# Patient Record
Sex: Female | Born: 1935
Health system: Southern US, Community
[De-identification: ages and names within clinical notes are randomized; demographics above are authoritative.]

## PROBLEM LIST (undated history)

## (undated) DIAGNOSIS — Z9010 Acquired absence of unspecified breast and nipple: Secondary | ICD-10-CM

## (undated) DIAGNOSIS — I10 Essential (primary) hypertension: Secondary | ICD-10-CM

## (undated) DIAGNOSIS — C801 Malignant (primary) neoplasm, unspecified: Secondary | ICD-10-CM

## (undated) DIAGNOSIS — E039 Hypothyroidism, unspecified: Secondary | ICD-10-CM

## (undated) HISTORY — PX: BREAST SURGERY: SHX581

## (undated) HISTORY — PX: LEG AMPUTATION: SHX1105

## (undated) HISTORY — PX: ABDOMINAL HYSTERECTOMY: SHX81

---

## 1998-02-01 ENCOUNTER — Inpatient Hospital Stay (HOSPITAL_COMMUNITY): Admission: RE | Admit: 1998-02-01 | Discharge: 1998-02-03 | Payer: Self-pay | Admitting: *Deleted

## 1998-07-25 ENCOUNTER — Ambulatory Visit (HOSPITAL_COMMUNITY): Admission: RE | Admit: 1998-07-25 | Discharge: 1998-07-25 | Payer: Self-pay | Admitting: Oral & Maxillofacial Surgery

## 1998-07-25 ENCOUNTER — Encounter: Payer: Self-pay | Admitting: Oral & Maxillofacial Surgery

## 1999-08-15 ENCOUNTER — Other Ambulatory Visit: Admission: RE | Admit: 1999-08-15 | Discharge: 1999-08-15 | Payer: Self-pay | Admitting: Internal Medicine

## 2000-01-08 ENCOUNTER — Encounter: Payer: Self-pay | Admitting: Internal Medicine

## 2000-01-08 ENCOUNTER — Encounter: Admission: RE | Admit: 2000-01-08 | Discharge: 2000-01-08 | Payer: Self-pay | Admitting: Internal Medicine

## 2000-08-22 ENCOUNTER — Encounter: Admission: RE | Admit: 2000-08-22 | Discharge: 2000-08-22 | Payer: Self-pay | Admitting: Internal Medicine

## 2000-08-22 ENCOUNTER — Encounter: Payer: Self-pay | Admitting: Internal Medicine

## 2001-03-12 ENCOUNTER — Encounter: Admission: RE | Admit: 2001-03-12 | Discharge: 2001-03-12 | Payer: Self-pay | Admitting: Internal Medicine

## 2001-03-12 ENCOUNTER — Encounter: Payer: Self-pay | Admitting: Internal Medicine

## 2001-03-20 ENCOUNTER — Encounter: Payer: Self-pay | Admitting: Internal Medicine

## 2001-03-20 ENCOUNTER — Encounter: Admission: RE | Admit: 2001-03-20 | Discharge: 2001-03-20 | Payer: Self-pay | Admitting: Internal Medicine

## 2002-07-21 ENCOUNTER — Encounter: Payer: Self-pay | Admitting: Emergency Medicine

## 2002-07-21 ENCOUNTER — Inpatient Hospital Stay (HOSPITAL_COMMUNITY): Admission: EM | Admit: 2002-07-21 | Discharge: 2002-07-24 | Payer: Self-pay | Admitting: Emergency Medicine

## 2002-07-22 ENCOUNTER — Encounter: Payer: Self-pay | Admitting: Specialist

## 2002-07-23 ENCOUNTER — Encounter: Payer: Self-pay | Admitting: Specialist

## 2003-02-08 ENCOUNTER — Encounter: Admission: RE | Admit: 2003-02-08 | Discharge: 2003-02-08 | Payer: Self-pay | Admitting: Internal Medicine

## 2003-02-08 ENCOUNTER — Encounter: Payer: Self-pay | Admitting: Internal Medicine

## 2003-02-08 ENCOUNTER — Encounter (INDEPENDENT_AMBULATORY_CARE_PROVIDER_SITE_OTHER): Payer: Self-pay | Admitting: Diagnostic Radiology

## 2003-02-08 ENCOUNTER — Encounter (INDEPENDENT_AMBULATORY_CARE_PROVIDER_SITE_OTHER): Payer: Self-pay | Admitting: *Deleted

## 2003-02-15 ENCOUNTER — Encounter: Admission: RE | Admit: 2003-02-15 | Discharge: 2003-02-15 | Payer: Self-pay | Admitting: General Surgery

## 2003-02-15 ENCOUNTER — Encounter: Payer: Self-pay | Admitting: General Surgery

## 2003-02-17 ENCOUNTER — Ambulatory Visit (HOSPITAL_BASED_OUTPATIENT_CLINIC_OR_DEPARTMENT_OTHER): Admission: RE | Admit: 2003-02-17 | Discharge: 2003-02-17 | Payer: Self-pay | Admitting: General Surgery

## 2003-02-17 ENCOUNTER — Encounter: Payer: Self-pay | Admitting: General Surgery

## 2003-02-17 ENCOUNTER — Encounter (INDEPENDENT_AMBULATORY_CARE_PROVIDER_SITE_OTHER): Payer: Self-pay | Admitting: *Deleted

## 2003-03-10 ENCOUNTER — Encounter (INDEPENDENT_AMBULATORY_CARE_PROVIDER_SITE_OTHER): Payer: Self-pay | Admitting: *Deleted

## 2003-03-10 ENCOUNTER — Inpatient Hospital Stay (HOSPITAL_COMMUNITY): Admission: RE | Admit: 2003-03-10 | Discharge: 2003-03-12 | Payer: Self-pay | Admitting: General Surgery

## 2003-07-06 ENCOUNTER — Encounter: Admission: RE | Admit: 2003-07-06 | Discharge: 2003-10-04 | Payer: Self-pay | Admitting: Specialist

## 2004-03-13 ENCOUNTER — Encounter: Admission: RE | Admit: 2004-03-13 | Discharge: 2004-03-13 | Payer: Self-pay | Admitting: Oncology

## 2004-07-02 ENCOUNTER — Ambulatory Visit: Payer: Self-pay | Admitting: Oncology

## 2004-07-09 ENCOUNTER — Encounter: Admission: RE | Admit: 2004-07-09 | Discharge: 2004-10-07 | Payer: Self-pay | Admitting: Specialist

## 2004-10-16 ENCOUNTER — Encounter: Admission: RE | Admit: 2004-10-16 | Discharge: 2004-10-16 | Payer: Self-pay | Admitting: General Surgery

## 2004-12-28 ENCOUNTER — Ambulatory Visit: Payer: Self-pay | Admitting: Oncology

## 2005-07-15 ENCOUNTER — Ambulatory Visit: Payer: Self-pay | Admitting: Oncology

## 2005-10-30 ENCOUNTER — Encounter: Admission: RE | Admit: 2005-10-30 | Discharge: 2005-10-30 | Payer: Self-pay | Admitting: Internal Medicine

## 2006-01-10 ENCOUNTER — Ambulatory Visit: Payer: Self-pay | Admitting: Oncology

## 2006-02-21 LAB — CBC WITH DIFFERENTIAL/PLATELET
BASO%: 0.1 % (ref 0.0–2.0)
Basophils Absolute: 0 10*3/uL (ref 0.0–0.1)
EOS%: 2.8 % (ref 0.0–7.0)
Eosinophils Absolute: 0.2 10*3/uL (ref 0.0–0.5)
HCT: 33 % — ABNORMAL LOW (ref 34.8–46.6)
HGB: 11.3 g/dL — ABNORMAL LOW (ref 11.6–15.9)
LYMPH%: 23.1 % (ref 14.0–48.0)
MCH: 31.4 pg (ref 26.0–34.0)
MCHC: 34.1 g/dL (ref 32.0–36.0)
MCV: 92.1 fL (ref 81.0–101.0)
MONO#: 0.5 10*3/uL (ref 0.1–0.9)
MONO%: 6.7 % (ref 0.0–13.0)
NEUT#: 5.1 10*3/uL (ref 1.5–6.5)
NEUT%: 67.3 % (ref 39.6–76.8)
Platelets: 184 10*3/uL (ref 145–400)
RBC: 3.58 10*6/uL — ABNORMAL LOW (ref 3.70–5.32)
RDW: 12.8 % (ref 11.3–14.5)
WBC: 7.6 10*3/uL (ref 3.9–10.0)
lymph#: 1.7 10*3/uL (ref 0.9–3.3)

## 2006-07-11 ENCOUNTER — Ambulatory Visit: Payer: Self-pay | Admitting: Internal Medicine

## 2006-07-31 ENCOUNTER — Encounter (INDEPENDENT_AMBULATORY_CARE_PROVIDER_SITE_OTHER): Payer: Self-pay | Admitting: Specialist

## 2006-07-31 ENCOUNTER — Ambulatory Visit: Payer: Self-pay | Admitting: Internal Medicine

## 2006-08-20 ENCOUNTER — Ambulatory Visit: Payer: Self-pay | Admitting: Oncology

## 2006-08-22 LAB — CBC WITH DIFFERENTIAL/PLATELET
BASO%: 0.2 % (ref 0.0–2.0)
EOS%: 2.4 % (ref 0.0–7.0)
LYMPH%: 19.3 % (ref 14.0–48.0)
MCHC: 34.5 g/dL (ref 32.0–36.0)
MCV: 92.5 fL (ref 81.0–101.0)
MONO#: 0.6 10*3/uL (ref 0.1–0.9)
MONO%: 10.7 % (ref 0.0–13.0)
NEUT%: 67.4 % (ref 39.6–76.8)
RBC: 2.97 10*6/uL — ABNORMAL LOW (ref 3.70–5.32)
RDW: 13.5 % (ref 11.3–14.5)
lymph#: 1 10*3/uL (ref 0.9–3.3)

## 2006-08-22 LAB — IRON AND TIBC: UIBC: 185 ug/dL

## 2006-08-22 LAB — RETICULOCYTES
IRF: 0.18 (ref 0.130–0.330)
Retic %: 0.3 % — ABNORMAL LOW (ref 0.4–2.3)

## 2006-08-22 LAB — VITAMIN B12: Vitamin B-12: 638 pg/mL (ref 211–911)

## 2006-08-29 ENCOUNTER — Emergency Department (HOSPITAL_COMMUNITY): Admission: EM | Admit: 2006-08-29 | Discharge: 2006-08-29 | Payer: Self-pay | Admitting: Family Medicine

## 2006-11-04 ENCOUNTER — Encounter: Admission: RE | Admit: 2006-11-04 | Discharge: 2006-11-04 | Payer: Self-pay | Admitting: Oncology

## 2007-02-17 ENCOUNTER — Ambulatory Visit: Payer: Self-pay | Admitting: Oncology

## 2007-02-19 LAB — COMPREHENSIVE METABOLIC PANEL
ALT: 14 U/L (ref 0–35)
AST: 23 U/L (ref 0–37)
Alkaline Phosphatase: 47 U/L (ref 39–117)
Creatinine, Ser: 1.11 mg/dL (ref 0.40–1.20)
Sodium: 139 mEq/L (ref 135–145)
Total Bilirubin: 0.4 mg/dL (ref 0.3–1.2)

## 2007-02-19 LAB — CBC WITH DIFFERENTIAL/PLATELET
BASO%: 0.1 % (ref 0.0–2.0)
EOS%: 3 % (ref 0.0–7.0)
HCT: 29.4 % — ABNORMAL LOW (ref 34.8–46.6)
LYMPH%: 23.2 % (ref 14.0–48.0)
MCH: 31.8 pg (ref 26.0–34.0)
MCHC: 34.9 g/dL (ref 32.0–36.0)
NEUT%: 67.5 % (ref 39.6–76.8)
Platelets: 133 10*3/uL — ABNORMAL LOW (ref 145–400)
RBC: 3.23 10*6/uL — ABNORMAL LOW (ref 3.70–5.32)

## 2007-08-17 ENCOUNTER — Ambulatory Visit: Payer: Self-pay | Admitting: Oncology

## 2007-08-19 LAB — CBC & DIFF AND RETIC
Eosinophils Absolute: 0.2 10*3/uL (ref 0.0–0.5)
HCT: 31.3 % — ABNORMAL LOW (ref 34.8–46.6)
LYMPH%: 24.7 % (ref 14.0–48.0)
MCH: 31.3 pg (ref 26.0–34.0)
MCHC: 34.5 g/dL (ref 32.0–36.0)
MONO#: 0.4 10*3/uL (ref 0.1–0.9)
MONO%: 5.8 % (ref 0.0–13.0)
NEUT#: 4.1 10*3/uL (ref 1.5–6.5)
RBC: 3.45 10*6/uL — ABNORMAL LOW (ref 3.70–5.32)
RDW: 12.9 % (ref 11.3–14.5)
RETIC #: 22.4 10*3/uL (ref 19.7–115.1)
Retic %: 0.7 % (ref 0.4–2.3)
WBC: 6.2 10*3/uL (ref 3.9–10.0)
lymph#: 1.5 10*3/uL (ref 0.9–3.3)

## 2007-08-19 LAB — MORPHOLOGY: PLT EST: NORMAL

## 2007-08-20 LAB — COMPREHENSIVE METABOLIC PANEL
ALT: 14 U/L (ref 0–35)
BUN: 25 mg/dL — ABNORMAL HIGH (ref 6–23)
CO2: 27 mEq/L (ref 19–32)
Chloride: 105 mEq/L (ref 96–112)
Creatinine, Ser: 1.08 mg/dL (ref 0.40–1.20)
Potassium: 4.2 mEq/L (ref 3.5–5.3)
Total Bilirubin: 0.4 mg/dL (ref 0.3–1.2)

## 2007-11-05 ENCOUNTER — Encounter: Admission: RE | Admit: 2007-11-05 | Discharge: 2007-11-05 | Payer: Self-pay | Admitting: Oncology

## 2008-04-12 ENCOUNTER — Ambulatory Visit: Payer: Self-pay | Admitting: Oncology

## 2008-04-14 LAB — CBC WITH DIFFERENTIAL/PLATELET
HGB: 10.2 g/dL — ABNORMAL LOW (ref 11.6–15.9)
LYMPH%: 31.5 % (ref 14.0–48.0)
MONO#: 0.4 10*3/uL (ref 0.1–0.9)
NEUT#: 3 10*3/uL (ref 1.5–6.5)
RBC: 3.23 10*6/uL — ABNORMAL LOW (ref 3.70–5.32)
WBC: 5.4 10*3/uL (ref 3.9–10.0)

## 2008-04-15 LAB — COMPREHENSIVE METABOLIC PANEL
ALT: 14 U/L (ref 0–35)
BUN: 27 mg/dL — ABNORMAL HIGH (ref 6–23)
Chloride: 103 mEq/L (ref 96–112)
Glucose, Bld: 121 mg/dL — ABNORMAL HIGH (ref 70–99)
Total Bilirubin: 0.3 mg/dL (ref 0.3–1.2)

## 2008-04-21 ENCOUNTER — Encounter: Payer: Self-pay | Admitting: Internal Medicine

## 2008-06-19 ENCOUNTER — Emergency Department (HOSPITAL_COMMUNITY): Admission: EM | Admit: 2008-06-19 | Discharge: 2008-06-19 | Payer: Self-pay | Admitting: Emergency Medicine

## 2008-11-30 ENCOUNTER — Encounter: Admission: RE | Admit: 2008-11-30 | Discharge: 2008-11-30 | Payer: Self-pay | Admitting: Internal Medicine

## 2009-12-01 ENCOUNTER — Encounter: Admission: RE | Admit: 2009-12-01 | Discharge: 2009-12-01 | Payer: Self-pay | Admitting: Internal Medicine

## 2010-10-26 NOTE — Op Note (Signed)
   NAME:  Alexandra Alexander, Alexandra Alexander                        ACCOUNT NO.:  [id number]   MEDICAL RECORD NO.:  [id number]                   PATIENT TYPE:  INP   LOCATION:  NA                                   FACILITY:  [facility]   PHYSICIAN:  Rose Phi. Maple Hudson, M.D.                DATE OF BIRTH:  Jul 13, 1921   DATE OF PROCEDURE:  03/10/2003  DATE OF DISCHARGE:                                 OPERATIVE REPORT   PREOPERATIVE DIAGNOSIS:  Lobular carcinoma of the right breast.   POSTOPERATIVE DIAGNOSIS:  Lobular carcinoma of the right breast.   OPERATION:  Right total mastectomy.   SURGEON:  Rose Phi. Maple Hudson, M.D.   ANESTHESIA:  General.   OPERATIVE PROCEDURE:  This 77 year old white female had presented with a  palpable mass in her right breast.  We had done a partial mastectomy and a  sentinel node biopsy for lobular cancer.  Her sentinel nodes were negative,  but she had more diffuse lobular disease than had been anticipated and we  had margins that were involved throughout and for that reason, felt a  completion mastectomy was the most appropriate for her.   After suitable general anesthesia was induced, the patient was placed in a  supine position with arms extended on the arm board and the right breast and  axilla were prepped and draped in usual fashion.  Transverse elliptical  incisions were then outlined, incorporating the nipple-areolar complex as  well as the previous partial mastectomy site.  The incisions were made and  the flaps dissected superiorly to the clavicle and medially to the sternum  and inferiorly to the rectus fascia and laterally to the latissimus dorsi  muscle.  Following the completion of the flap dissection, we then removed  the breast by dissecting from medial to lateral, incorporating the  pectoralis fascia.  At the lateral margins of the pectoralis major muscle,  we entered the low axilla and removed the lateral part of the breast tissue  and then the complete breast.   Hemostasis was obtained with the cautery.  We  thoroughly irrigated the field with saline.  An 18-French Blake drain was  inserted and brought out through a separate stab wound.  Skin was then  stapled.  Dressing applied.  Patient transferred to the recovery room in  satisfactory condition, having tolerated the procedure well.                                               Rose Phi. Maple Hudson, M.D.    PRY/MEDQ  D:  03/10/2003  T:  03/10/2003  Job:  [id number]

## 2010-10-26 NOTE — Discharge Summary (Signed)
   NAME:  Alexandra Alexander, Alexandra Alexander                        ACCOUNT NO.:  [id number]   MEDICAL RECORD NO.:  [id number]                   PATIENT TYPE:  INP   LOCATION:  5707                                 FACILITY:  [facility]   PHYSICIAN:  Rose Phi. Maple Hudson, M.D.                DATE OF BIRTH:  1921-11-24   DATE OF ADMISSION:  03/10/2003  DATE OF DISCHARGE:  03/12/2003                                 DISCHARGE SUMMARY   HISTORY OF PRESENT ILLNESS:  This 77 year old white married female had had a  large lobular carcinoma of the right breast treated with partial mastectomy  and sentinel node biopsies.  On permanent section, she had extensive  positive margins which were not identified at the time of lumpectomy.  For  that reason, she was scheduled for completion mastectomy.  The rest of the  history is not remarkable.  The only significant physical findings were  confined to the breast, where she had evidence of the previous surgery with  lumpectomy and sentinel node on the right side.  The left breast was normal.  She did have an amputation of her left leg, which had been previously noted.   LABORATORY DATA:  Admission laboratory data were all within normal limits.   COURSE IN THE HOSPITAL:  Shortly after admission, she was taken to the  operating room where a right total mastectomy was carried out.  Everything  went well and postoperatively, she did well, although she had some nausea,  but ambulating was a problem, with her amputation.  She was then discharged  on the second postoperative day to be followed as an outpatient.   FINAL DIAGNOSES:  T1, N0 lobular cancer of the right breast.   OPERATION:  Right total mastectomy.   COMPLICATIONS:  None.   CONDITION ON DISCHARGE:  Improved.   DISCHARGE MEDICATIONS:  Vicodin for pain.   DIET:  Regular.   ACTIVITY:  Limited.   FOLLOWUP:  To be seen in five days in the office.                                                Rose Phi. Maple Hudson,  M.D.    PRY/MEDQ  D:  03/28/2003  T:  03/28/2003  Job:  [id number]

## 2010-10-26 NOTE — Discharge Summary (Signed)
NAME:  Alexandra Alexander, Alexandra Alexander                        ACCOUNT NO.:  [id number]   MEDICAL RECORD NO.:  [id number]                   PATIENT TYPE:  INP   LOCATION:  5002                                 FACILITY:  [facility]   PHYSICIAN:  Kerrin Champagne, M.D.                DATE OF BIRTH:  02/09/1922   DATE OF ADMISSION:  07/21/2002  DATE OF DISCHARGE:  07/24/2002                                 DISCHARGE SUMMARY   DISCHARGE DIAGNOSES:  1. Left comminuted supracondylar femur fracture.  2. Left below-knee amputation for previous history of osteomyelitis,     chronic.  Old diagnosis.  3. Posttraumatic hemorrhage secondary to left femur fracture.   CHIEF COMPLAINT:  Left distal thigh pain.   HISTORY OF PRESENT ILLNESS:  This patient is a 77 year old female with a  history of previous left below-knee amputation 25 years ago for  osteomyelitis following an ORIF of a left tibial fracture by Paul Dykes. [name],  M.D.  She has done relatively well without significant medical problems,  using left below-knee amputation prosthesis for ambulation purposes  independently.  On the evening of July 21, 2002, while ambulating at  home, she treated her left leg BK prosthesis.  Apparently the prosthesis  came loose and she fell to the floor, sustaining a left femur fracture.   ALLERGIES:  CODEINE.   MEDICATIONS:  None.   PAST SURGICAL HISTORY:  Significant for:  1. Left below-knee amputation some 25 years ago.  2. Previous left ORIF of tibial fracture with placement of screw some 25     years ago.  3. Bilateral cataract procedures almost two months ago.   LABORATORY DATA:  Admission laboratory data, including EKG, chest x-ray,  BMET, CBC, differential, and chest x-ray all without acute abnormalities.  The patient's hemoglobin on admission was 11.3.  Plain radiographs of the  left leg demonstrating a comminuted left distal femoral supracondylar  fracture with some comminution.  There is a long extension.   There are  fragments extending proximally over the anterior aspect of the femur.  There  is flexion deformity of about 40 degrees and some medial displacement of the  distal fragment and varus deformity.   PHYSICAL EXAMINATION:  GENERAL APPEARANCE:  A 77 year old female on the  examination table in the emergency room.  She is moderately obese.  She is  alert and oriented x 4.  Left below-knee amputation stump and left femur  without obvious wound.  She has swelling about the left distal femur in the  distal one-third above the knee joint.  The patient's BK stump appears to be  in good condition.  HEENT:  Pupils are equal, round, and reactive to light and accomodation.  NECK:  Supple with full range of motion.  No thyromegaly noted.  No carotid  bruits noted.  CHEST:  Clear to auscultation.  HEART:  Regular rate and rhythm without murmur, rub, or  gallop.  ABDOMEN:  Soft and nontender.  EXTREMITIES:  Hips without discomfort.  Right lower extremity without  abnormality.  Pulses complete in the right lower extremity.  The left leg  has a BK stump.  The stump itself is well closed.  No abnormalities of skin  noted.  Warm, sensate leg with normal motor.  PELVIS:  Without discomfort.   HOSPITAL COURSE:  In the emergency room, treatment was undertaken with  placement into a knee immobilizer following Ace wrap and plain radiographs  demonstrated good position and alignment of the left distal femur fracture  in the knee immobilizer.   The patient was seen in the emergency room at [facility] and underwent initial evaluation.  She was placed in a knee  immobilizer with Ace wrap applied.  With the knee immobilizer, the fracture  reduced quite nicely in both AP and lateral planes.  Follow-up radiographs  demonstrated this.  The patient was admitted and placed on PCA with morphine  and oral medications.  A nursing home placement was undertaken.  The patient  was placed on  Lovenox for DVT prophylaxis and PAS hose.  After a period of  two days, placement was able to be performed.  The patient was placed in the  [facility].  The patient's hemoglobin  throughout the hospitalization did show some decrease from an initial  admission of 11 down to 9.8 on July 23, 2002, prior to her discharge.  Further hemoglobin and hematocrit were ordered for the a.m. of July 24, 2002.  The patient will be followed closely at the nursing home for this and  has been placed on Trinsicon one tablet p.o. b.i.d. and Colace.  On day #2  of this patient's admission, July 23, 2002, the patient's lower  extremity exam on the left side demonstrate normal sensate BK stump with a  normal peripheral circulation pattern.  The leg with Ace wrap knee  immobilizer demonstrated no significant deformity.  The patient was  comfortable with oral medications and muscle relaxants.  IV was  discontinued.  The patient then had a Foley in place.  She was discharged to  the [facility] on July 24, 2002, with  stable pain pattern and in stable condition regarding the left lower  extremity.   DISPOSITION:  The patient was discharged to the [facility] where she will require assistance and transfers from bed to chair and  bed to wheelchair, nonweightbearing of the left lower extremity.  She will  maintain bed rest for most of this time and there will be no weightbearing  on the left lower extremity for at least six to eight weeks postoperatively.  She will be maintained with narcotic medicines by mouth, an anticoagulation  program for anti-DVT prophylaxis, Lovenox 30 mg daily.  She requires  additional checks of hemoglobin and hematocrit to ascertain normalization  and stabilization of this during her stay in the nursing center.  No  incisions are present requiring examination.  CONDITION ON DISCHARGE:   Stable and improved.   DISCHARGE MEDICATIONS:  1. Percocet 5/325 mg one to two q.4-6h. p.r.n. pain.  2. Lovenox 30 mg subcu daily.  3. Trinsicon one tablet p.o. b.i.d.  4. Colace 100 mg p.o. b.i.d.  Kerrin Champagne, M.D.    JEN/MEDQ  D:  07/23/2002  T:  07/23/2002  Job:  [id number]

## 2010-10-26 NOTE — Op Note (Signed)
   NAME:  Alexandra Alexander, Alexandra Alexander                        ACCOUNT NO.:  [id number]   MEDICAL RECORD NO.:  [id number]                   PATIENT TYPE:  AMB   LOCATION:  DSC                                  FACILITY:  [facility]   PHYSICIAN:  Rose Phi. Maple Hudson, M.D.                DATE OF BIRTH:  [birth year]   DATE OF PROCEDURE:  02/17/2003  DATE OF DISCHARGE:                                 OPERATIVE REPORT   PREOPERATIVE DIAGNOSIS:  Carcinoma of the right breast.   POSTOPERATIVE DIAGNOSIS:  Carcinoma of the right breast.   OPERATION:  1. Blue dye injection.  2. Right sentinel lymph node biopsy.  3. Right partial mastectomy.   SURGEON:  Rose Phi. Maple Hudson, M.D.   ANESTHESIA:  General.   DESCRIPTION OF PROCEDURE:  Prior to coming to the operating room, 1 mCi of  technetium sulfur colloid was injected intradermally.   After suitable general anesthesia was induced, the patient was placed in the  supine position and with arms extended.  A mixture of 2 mL of methylene blue  and 3 mL of saline were then injected in the subareolar tissue and the  breast gently massaged.   We then prepped and draped the breast in a standard fashion.  A right  axillary incision was then made with dissection through the subcutaneous  tissue to the clavipectoral fascia.  Just beneath the clavipectoral fascia  were two enlarged lymph nodes, which we removed.  They were hot on the scan  but not much blue dye.  These were submitted as sentinel nodes.  There were  no other palpable, blue, or hot nodes.   While that was being evaluated by the pathologist, a curved incision  overlying the palpable mass at the 6:30 position of the right breast was  then made and a wide excision was carried out and the specimen oriented for  the pathologist.   Touch prep on the nodes and touch prep on the margins were clean.   Incisions were closed with 3-0 Vicryl and subcuticular 4-0 Monocryl with  Steri-Strips.  Dressings applied.  Patient  transferred to the recovery room  in satisfactory condition, having tolerated the procedure well.                                               Rose Phi. Maple Hudson, M.D.    PRY/MEDQ  D:  02/17/2003  T:  02/18/2003  Job:  [id number]   cc:   Geoffry Paradise, M.D.  [address]  [place]  [place] [address]  Fax: [phone]

## 2010-10-26 NOTE — Assessment & Plan Note (Signed)
[facility]                         GASTROENTEROLOGY OFFICE NOTE   Alexandra Alexander, Alexandra Alexander                     MRN:          [id number]  DATE:07/11/2006                            DOB:          [birth year]    REFERRING PHYSICIAN:  Geoffry Paradise, M.D.   OFFICE CONSULTATION NOTE   HISTORY:  This is a pleasant 77 year old white female with a history of  hypertension, hypothyroidism, debilitating arthritis, breast cancer, and  depression, who is referred through the courtesy of Dr. Jacky Kindle  regarding abdominal complaints, weight loss, and anemia.  The patient  underwent amputation of the lower portion of her left leg may years ago  after, poor healing secondary to trauma.  Recently, she has had pain or  arthritic change in that stump.  For this, she has been on a number of  different medications, including Vicodin and Duragesic patch.  This has  resulted in problems with constipation and uncomfortable abdomen.  She  has also had decrease in appetite and a 13 pound weight loss over the  past 2 months.  The patient's laboratories revealed a normocytic anemia,  which is not new for the patient.  Submitted Hemoccult cards were  negative for occult blood.  Has been on MiraLax for her bowels.  This  has helped.  She denies heartburn, dysphagia, nausea or vomiting,  melena, or hematochezia.  She has not had a prior GI evaluation for any  reason.  No prior screening colonoscopy.   PAST MEDICAL HISTORY:  As above.   PAST SURGICAL HISTORY:  1. Hysterectomy.  2. Bladder tac.  3. Right breast surgery.  4. Appendectomy.  5. Left leg amputation.   ALLERGIES/DRUG INTOLERANCES:  DARVOCET.   CURRENT MEDICATIONS:  1. Synthroid 50 mcg daily.  2. Vicodin 5/500 four daily.  3. Ocuvite eye drops.  4. Tamoxifen 20 mg daily.  5. Metoprolol XL 25 mg daily.  6. Aspirin 81 mg daily.  7. Multivitamin.  8. MiraLax 17 g alternating with 8.5 g daily.  9. Lexapro 10 mg  daily.  10.Zolpidem 5 mg at night time.  11.Tums.  12.Duragesic patch 25 mcg q.3 days.   FAMILY HISTORY:  No family history of colon cancer.  Father with a  history of diabetes.  Mother with a history of breast cancer.   SOCIAL HISTORY:  The patient is widowed and lives alone.  She has 3  children.  She is accompanied today by her daughter-in-law.  She has a  12th grade education and worked her life as a housewife.  She does not  smoke or use alcohol.   REVIEW OF SYSTEMS:  Per diagnostic evaluation form.   PHYSICAL EXAM:  Alert, bright, and pleasant elderly female in no acute  distress.  She has a walker.  Blood pressure 130/72, heart rate 68, weight 120 pounds.  She is 5 feet  1 inch in height.  HEENT:  Sclerae anicteric, conjunctivae slightly pale.  Oral mucosa  intact.  There is no adenopathy.  LUNGS:  Clear.  HEART:  Regular.  ABDOMEN:  Soft without tenderness, mass, or hernia.  LOWER EXTREMITIES:  Prosthetic leg attached.  The right lower extremity  does not have edema.   IMPRESSION:  1. Chronic normocytic anemia with heme-negative stool.  2. Weight loss with associated decreased appetite, possible medication      reaction.  Cannot exclude occult gastrointestinal process such as      neoplasm or ulcer.  3. Constipation, likely medication induced.  4. Multiple medical problems as outlined above.   RECOMMENDATIONS:  Colonoscopy with polypectomy if necessary and upper  endoscopy if colonoscopy unrevealing.  The nature of both procedures as  well as the risks, benefits, and alternatives are reviewed.  She  understood and agreed to proceed.     Wilhemina Bonito. Marina Goodell, MD  Electronically Signed    JNP/MedQ  DD: 07/13/2006  DT: 07/13/2006  Job #: [id number]   cc:   Geoffry Paradise, M.D.  Valentino Hue. Magrinat, M.D.

## 2010-11-13 ENCOUNTER — Other Ambulatory Visit: Payer: Self-pay | Admitting: Internal Medicine

## 2010-11-13 DIAGNOSIS — Z853 Personal history of malignant neoplasm of breast: Secondary | ICD-10-CM

## 2010-12-10 ENCOUNTER — Ambulatory Visit
Admission: RE | Admit: 2010-12-10 | Discharge: 2010-12-10 | Disposition: A | Discharge status: Home / Self Care | Payer: Medicare Other | Source: Ambulatory Visit | Attending: Internal Medicine | Admitting: Internal Medicine

## 2010-12-10 DIAGNOSIS — Z853 Personal history of malignant neoplasm of breast: Secondary | ICD-10-CM

## 2011-01-01 ENCOUNTER — Ambulatory Visit (HOSPITAL_COMMUNITY)
Admission: RE | Admit: 2011-01-01 | Discharge: 2011-01-01 | Disposition: A | Discharge status: Home / Self Care | Payer: Medicare Other | Source: Ambulatory Visit | Attending: Internal Medicine | Admitting: Internal Medicine

## 2011-01-01 ENCOUNTER — Other Ambulatory Visit (HOSPITAL_COMMUNITY): Payer: Self-pay | Admitting: Internal Medicine

## 2011-01-01 DIAGNOSIS — R51 Headache: Secondary | ICD-10-CM | POA: Insufficient documentation

## 2011-01-01 DIAGNOSIS — W19XXXA Unspecified fall, initial encounter: Secondary | ICD-10-CM | POA: Insufficient documentation

## 2011-01-01 DIAGNOSIS — I619 Nontraumatic intracerebral hemorrhage, unspecified: Secondary | ICD-10-CM | POA: Insufficient documentation

## 2011-01-01 DIAGNOSIS — I10 Essential (primary) hypertension: Secondary | ICD-10-CM | POA: Insufficient documentation

## 2011-01-01 DIAGNOSIS — R42 Dizziness and giddiness: Secondary | ICD-10-CM | POA: Insufficient documentation

## 2011-08-08 DIAGNOSIS — E039 Hypothyroidism, unspecified: Secondary | ICD-10-CM | POA: Diagnosis not present

## 2011-08-08 DIAGNOSIS — E785 Hyperlipidemia, unspecified: Secondary | ICD-10-CM | POA: Diagnosis not present

## 2011-08-08 DIAGNOSIS — R82998 Other abnormal findings in urine: Secondary | ICD-10-CM | POA: Diagnosis not present

## 2011-08-08 DIAGNOSIS — I10 Essential (primary) hypertension: Secondary | ICD-10-CM | POA: Diagnosis not present

## 2011-08-14 DIAGNOSIS — I10 Essential (primary) hypertension: Secondary | ICD-10-CM | POA: Diagnosis not present

## 2011-08-14 DIAGNOSIS — C50919 Malignant neoplasm of unspecified site of unspecified female breast: Secondary | ICD-10-CM | POA: Diagnosis not present

## 2011-08-14 DIAGNOSIS — Z0000 Encounter for general adult medical examination without abnormal findings: Secondary | ICD-10-CM | POA: Diagnosis not present

## 2011-08-14 DIAGNOSIS — D638 Anemia in other chronic diseases classified elsewhere: Secondary | ICD-10-CM | POA: Diagnosis not present

## 2011-09-03 DIAGNOSIS — Z1212 Encounter for screening for malignant neoplasm of rectum: Secondary | ICD-10-CM | POA: Diagnosis not present

## 2011-12-05 ENCOUNTER — Other Ambulatory Visit: Payer: Self-pay | Admitting: Internal Medicine

## 2011-12-05 DIAGNOSIS — Z1231 Encounter for screening mammogram for malignant neoplasm of breast: Secondary | ICD-10-CM

## 2011-12-05 DIAGNOSIS — Z9011 Acquired absence of right breast and nipple: Secondary | ICD-10-CM

## 2011-12-30 ENCOUNTER — Ambulatory Visit
Admission: RE | Admit: 2011-12-30 | Discharge: 2011-12-30 | Disposition: A | Discharge status: Home / Self Care | Payer: Medicare Other | Source: Ambulatory Visit | Attending: Internal Medicine | Admitting: Internal Medicine

## 2011-12-30 DIAGNOSIS — Z9011 Acquired absence of right breast and nipple: Secondary | ICD-10-CM

## 2011-12-30 DIAGNOSIS — Z1231 Encounter for screening mammogram for malignant neoplasm of breast: Secondary | ICD-10-CM | POA: Diagnosis not present

## 2012-01-09 ENCOUNTER — Emergency Department (INDEPENDENT_AMBULATORY_CARE_PROVIDER_SITE_OTHER): Payer: Medicare Other

## 2012-01-09 ENCOUNTER — Emergency Department (INDEPENDENT_AMBULATORY_CARE_PROVIDER_SITE_OTHER)
Admission: EM | Admit: 2012-01-09 | Discharge: 2012-01-09 | Disposition: A | Discharge status: Home / Self Care | Payer: Medicare Other | Source: Home / Self Care | Attending: Emergency Medicine | Admitting: Emergency Medicine

## 2012-01-09 ENCOUNTER — Encounter (HOSPITAL_COMMUNITY): Payer: Self-pay | Admitting: *Deleted

## 2012-01-09 DIAGNOSIS — S60219A Contusion of unspecified wrist, initial encounter: Secondary | ICD-10-CM

## 2012-01-09 DIAGNOSIS — M25539 Pain in unspecified wrist: Secondary | ICD-10-CM | POA: Diagnosis not present

## 2012-01-09 DIAGNOSIS — M7989 Other specified soft tissue disorders: Secondary | ICD-10-CM | POA: Diagnosis not present

## 2012-01-09 DIAGNOSIS — S60211A Contusion of right wrist, initial encounter: Secondary | ICD-10-CM

## 2012-01-09 HISTORY — DX: Essential (primary) hypertension: I10

## 2012-01-09 HISTORY — DX: Malignant (primary) neoplasm, unspecified: C80.1

## 2012-01-09 NOTE — ED Notes (Signed)
R  Wrist    Splint  Applied        [employer]

## 2012-01-09 NOTE — ED Provider Notes (Signed)
Chief Complaint  Patient presents with  . Fall    History of Present Illness:   Alexandra Alexander is an 78 year old female who tripped over a crack in the cement and fell in her carport yesterday. She did not hit her head and there was no loss of consciousness. She fell on her outstretched right hand and now has some bruising, swelling, and pain over the wrist. She is able to move her wrist and fingers well and denies any numbness or tingling.  Review of Systems:  Other than noted above, the patient denies any of the following symptoms: Systemic:  No fevers, chills, sweats, or aches.  No fatigue or tiredness. Musculoskeletal:  No joint pain, arthritis, bursitis, swelling, back pain, or neck pain. Neurological:  No muscular weakness, paresthesias, headache, or trouble with speech or coordination.  No dizziness.   PMFSH:  Past medical history, family history, social history, meds, and allergies were reviewed.  Physical Exam:   Vital signs:  BP 192/70  Pulse 55  Temp 97.8 F (36.6 C) (Oral)  Resp 20  SpO2 96% Gen:  Alert and oriented times 3.  In no distress. Musculoskeletal: There is some bruising and swelling over the dorsum and the volar aspect of the wrist but the wrist has a full range of motion with only minimal pain. She also has some bruising over the thenar eminence of the hand. All of her fingers have a full range of motion. Otherwise, all joints had a full a ROM with no swelling, bruising or deformity.  No edema, pulses full. Extremities were warm and pink.  Capillary refill was brisk.  Skin:  Clear, warm and dry.  No rash. Neuro:  Alert and oriented times 3.  Muscle strength was normal.  Sensation was intact to light touch.   Radiology:  Dg Wrist Complete Right  01/09/2012  *RADIOLOGY REPORT*  Clinical Data: Fall yesterday.  Pain swelling and bruising of the wrist  RIGHT WRIST - COMPLETE 3+ VIEW  Comparison: None.  Findings: The bones are osteopenic.  Soft tissue swelling is seen  adjacent to the ulna styloid, and dorsally at the level of the carpals, best seen on the lateral view.  The carpals are located.  No acute or healing fracture is identified.  There is slight cortical thickening along the radial side of the distal radius.  IMPRESSION: 1. Soft tissue swelling dorsal to the carpals and adjacent to the ulna styloid. 2.  No acute fracture is identified.  The bones are osteopenic.  If there is continued concern for acute fracture, further evaluation with MRI or CT could be considered.  Original Report Authenticated By: Britta Mccreedy, M.D.   Course in Urgent Care Center:   She was put in a wrist splint.  Assessment:  The encounter diagnosis was Contusion of right wrist.  Plan:   1.  The following meds were prescribed:   New Prescriptions   No medications on file   2.  The patient was instructed in symptomatic care, including rest and activity, elevation, application of ice and compression.  Appropriate handouts were given. 3.  The patient was told to return if becoming worse in any way, if no better in 3 or 4 days, and given some red flag symptoms that would indicate earlier return.   4.  The patient was told to follow up here if no improvement in 2 weeks.   Reuben Likes, MD 01/09/12 [phone]

## 2012-01-09 NOTE — ED Notes (Signed)
Yesterday  Pt  Lost  Her  Balance  And  Larey Seat  She  inj her  r  Hand  And  Wrist    Bruising  Noted  No  Obvious  Deformity  Did  Not  Black out

## 2012-02-14 DIAGNOSIS — E785 Hyperlipidemia, unspecified: Secondary | ICD-10-CM | POA: Diagnosis not present

## 2012-02-14 DIAGNOSIS — I10 Essential (primary) hypertension: Secondary | ICD-10-CM | POA: Diagnosis not present

## 2012-02-14 DIAGNOSIS — D638 Anemia in other chronic diseases classified elsewhere: Secondary | ICD-10-CM | POA: Diagnosis not present

## 2012-02-14 DIAGNOSIS — Z23 Encounter for immunization: Secondary | ICD-10-CM | POA: Diagnosis not present

## 2012-02-14 DIAGNOSIS — M5106 Intervertebral disc disorders with myelopathy, lumbar region: Secondary | ICD-10-CM | POA: Diagnosis not present

## 2012-08-10 DIAGNOSIS — E039 Hypothyroidism, unspecified: Secondary | ICD-10-CM | POA: Diagnosis not present

## 2012-08-10 DIAGNOSIS — I10 Essential (primary) hypertension: Secondary | ICD-10-CM | POA: Diagnosis not present

## 2012-08-10 DIAGNOSIS — E785 Hyperlipidemia, unspecified: Secondary | ICD-10-CM | POA: Diagnosis not present

## 2012-08-17 DIAGNOSIS — E785 Hyperlipidemia, unspecified: Secondary | ICD-10-CM | POA: Diagnosis not present

## 2012-08-17 DIAGNOSIS — Z0000 Encounter for general adult medical examination without abnormal findings: Secondary | ICD-10-CM | POA: Diagnosis not present

## 2012-08-17 DIAGNOSIS — D638 Anemia in other chronic diseases classified elsewhere: Secondary | ICD-10-CM | POA: Diagnosis not present

## 2012-08-17 DIAGNOSIS — R82998 Other abnormal findings in urine: Secondary | ICD-10-CM | POA: Diagnosis not present

## 2012-08-17 DIAGNOSIS — E039 Hypothyroidism, unspecified: Secondary | ICD-10-CM | POA: Diagnosis not present

## 2012-08-17 DIAGNOSIS — I10 Essential (primary) hypertension: Secondary | ICD-10-CM | POA: Diagnosis not present

## 2012-08-17 DIAGNOSIS — Z23 Encounter for immunization: Secondary | ICD-10-CM | POA: Diagnosis not present

## 2012-08-31 DIAGNOSIS — Z1212 Encounter for screening for malignant neoplasm of rectum: Secondary | ICD-10-CM | POA: Diagnosis not present

## 2013-03-03 DIAGNOSIS — E785 Hyperlipidemia, unspecified: Secondary | ICD-10-CM | POA: Diagnosis not present

## 2013-03-03 DIAGNOSIS — Z23 Encounter for immunization: Secondary | ICD-10-CM | POA: Diagnosis not present

## 2013-03-03 DIAGNOSIS — M5106 Intervertebral disc disorders with myelopathy, lumbar region: Secondary | ICD-10-CM | POA: Diagnosis not present

## 2013-03-03 DIAGNOSIS — Z6825 Body mass index (BMI) 25.0-25.9, adult: Secondary | ICD-10-CM | POA: Diagnosis not present

## 2013-03-03 DIAGNOSIS — M1990 Unspecified osteoarthritis, unspecified site: Secondary | ICD-10-CM | POA: Diagnosis not present

## 2013-03-03 DIAGNOSIS — I10 Essential (primary) hypertension: Secondary | ICD-10-CM | POA: Diagnosis not present

## 2013-03-03 DIAGNOSIS — C50919 Malignant neoplasm of unspecified site of unspecified female breast: Secondary | ICD-10-CM | POA: Diagnosis not present

## 2013-03-03 DIAGNOSIS — Z1331 Encounter for screening for depression: Secondary | ICD-10-CM | POA: Diagnosis not present

## 2013-03-03 DIAGNOSIS — S88119A Complete traumatic amputation at level between knee and ankle, unspecified lower leg, initial encounter: Secondary | ICD-10-CM | POA: Diagnosis not present

## 2013-03-03 DIAGNOSIS — E039 Hypothyroidism, unspecified: Secondary | ICD-10-CM | POA: Diagnosis not present

## 2013-03-05 ENCOUNTER — Other Ambulatory Visit: Payer: Self-pay

## 2013-03-05 DIAGNOSIS — Z9011 Acquired absence of right breast and nipple: Secondary | ICD-10-CM

## 2013-03-05 DIAGNOSIS — Z1231 Encounter for screening mammogram for malignant neoplasm of breast: Secondary | ICD-10-CM

## 2013-03-25 ENCOUNTER — Ambulatory Visit: Payer: Medicare Other

## 2013-03-29 ENCOUNTER — Ambulatory Visit
Admission: RE | Admit: 2013-03-29 | Discharge: 2013-03-29 | Disposition: A | Discharge status: Home / Self Care | Payer: Medicare Other | Source: Ambulatory Visit

## 2013-03-29 DIAGNOSIS — Z1231 Encounter for screening mammogram for malignant neoplasm of breast: Secondary | ICD-10-CM

## 2013-03-29 DIAGNOSIS — Z9011 Acquired absence of right breast and nipple: Secondary | ICD-10-CM

## 2013-08-25 DIAGNOSIS — E039 Hypothyroidism, unspecified: Secondary | ICD-10-CM | POA: Diagnosis not present

## 2013-08-25 DIAGNOSIS — I10 Essential (primary) hypertension: Secondary | ICD-10-CM | POA: Diagnosis not present

## 2013-08-25 DIAGNOSIS — R809 Proteinuria, unspecified: Secondary | ICD-10-CM | POA: Diagnosis not present

## 2013-08-25 DIAGNOSIS — E785 Hyperlipidemia, unspecified: Secondary | ICD-10-CM | POA: Diagnosis not present

## 2013-09-01 DIAGNOSIS — I10 Essential (primary) hypertension: Secondary | ICD-10-CM | POA: Diagnosis not present

## 2013-09-01 DIAGNOSIS — D638 Anemia in other chronic diseases classified elsewhere: Secondary | ICD-10-CM | POA: Diagnosis not present

## 2013-09-01 DIAGNOSIS — Z0000 Encounter for general adult medical examination without abnormal findings: Secondary | ICD-10-CM | POA: Diagnosis not present

## 2013-09-01 DIAGNOSIS — C50919 Malignant neoplasm of unspecified site of unspecified female breast: Secondary | ICD-10-CM | POA: Diagnosis not present

## 2013-09-01 DIAGNOSIS — M1990 Unspecified osteoarthritis, unspecified site: Secondary | ICD-10-CM | POA: Diagnosis not present

## 2013-09-01 DIAGNOSIS — E039 Hypothyroidism, unspecified: Secondary | ICD-10-CM | POA: Diagnosis not present

## 2013-09-01 DIAGNOSIS — E785 Hyperlipidemia, unspecified: Secondary | ICD-10-CM | POA: Diagnosis not present

## 2013-09-01 DIAGNOSIS — S88119A Complete traumatic amputation at level between knee and ankle, unspecified lower leg, initial encounter: Secondary | ICD-10-CM | POA: Diagnosis not present

## 2013-09-21 DIAGNOSIS — S88119A Complete traumatic amputation at level between knee and ankle, unspecified lower leg, initial encounter: Secondary | ICD-10-CM | POA: Diagnosis not present

## 2013-09-21 DIAGNOSIS — R269 Unspecified abnormalities of gait and mobility: Secondary | ICD-10-CM | POA: Diagnosis not present

## 2013-09-21 DIAGNOSIS — G547 Phantom limb syndrome without pain: Secondary | ICD-10-CM | POA: Diagnosis not present

## 2013-09-21 DIAGNOSIS — Z6827 Body mass index (BMI) 27.0-27.9, adult: Secondary | ICD-10-CM | POA: Diagnosis not present

## 2013-09-21 DIAGNOSIS — M5106 Intervertebral disc disorders with myelopathy, lumbar region: Secondary | ICD-10-CM | POA: Diagnosis not present

## 2014-01-24 DIAGNOSIS — N390 Urinary tract infection, site not specified: Secondary | ICD-10-CM | POA: Diagnosis not present

## 2014-01-24 DIAGNOSIS — R82998 Other abnormal findings in urine: Secondary | ICD-10-CM | POA: Diagnosis not present

## 2014-01-24 DIAGNOSIS — F29 Unspecified psychosis not due to a substance or known physiological condition: Secondary | ICD-10-CM | POA: Diagnosis not present

## 2014-01-24 DIAGNOSIS — I10 Essential (primary) hypertension: Secondary | ICD-10-CM | POA: Diagnosis not present

## 2014-01-24 DIAGNOSIS — Z6826 Body mass index (BMI) 26.0-26.9, adult: Secondary | ICD-10-CM | POA: Diagnosis not present

## 2014-01-24 DIAGNOSIS — E039 Hypothyroidism, unspecified: Secondary | ICD-10-CM | POA: Diagnosis not present

## 2014-02-10 DIAGNOSIS — N390 Urinary tract infection, site not specified: Secondary | ICD-10-CM | POA: Diagnosis not present

## 2014-03-04 DIAGNOSIS — I10 Essential (primary) hypertension: Secondary | ICD-10-CM | POA: Diagnosis not present

## 2014-03-04 DIAGNOSIS — Z23 Encounter for immunization: Secondary | ICD-10-CM | POA: Diagnosis not present

## 2014-03-04 DIAGNOSIS — M5106 Intervertebral disc disorders with myelopathy, lumbar region: Secondary | ICD-10-CM | POA: Diagnosis not present

## 2014-03-04 DIAGNOSIS — C50919 Malignant neoplasm of unspecified site of unspecified female breast: Secondary | ICD-10-CM | POA: Diagnosis not present

## 2014-03-04 DIAGNOSIS — E785 Hyperlipidemia, unspecified: Secondary | ICD-10-CM | POA: Diagnosis not present

## 2014-03-04 DIAGNOSIS — E039 Hypothyroidism, unspecified: Secondary | ICD-10-CM | POA: Diagnosis not present

## 2014-03-04 DIAGNOSIS — S88119A Complete traumatic amputation at level between knee and ankle, unspecified lower leg, initial encounter: Secondary | ICD-10-CM | POA: Diagnosis not present

## 2014-03-04 DIAGNOSIS — D638 Anemia in other chronic diseases classified elsewhere: Secondary | ICD-10-CM | POA: Diagnosis not present

## 2014-06-24 ENCOUNTER — Emergency Department (HOSPITAL_COMMUNITY): Payer: Medicare Other

## 2014-06-24 ENCOUNTER — Emergency Department (HOSPITAL_COMMUNITY)
Admission: EM | Admit: 2014-06-24 | Discharge: 2014-06-24 | Disposition: A | Discharge status: Home / Self Care | Payer: Medicare Other | Attending: Emergency Medicine | Admitting: Emergency Medicine

## 2014-06-24 ENCOUNTER — Encounter (HOSPITAL_COMMUNITY): Payer: Self-pay | Admitting: Emergency Medicine

## 2014-06-24 DIAGNOSIS — W01198A Fall on same level from slipping, tripping and stumbling with subsequent striking against other object, initial encounter: Secondary | ICD-10-CM | POA: Diagnosis not present

## 2014-06-24 DIAGNOSIS — S199XXA Unspecified injury of neck, initial encounter: Secondary | ICD-10-CM | POA: Diagnosis not present

## 2014-06-24 DIAGNOSIS — S42002A Fracture of unspecified part of left clavicle, initial encounter for closed fracture: Secondary | ICD-10-CM | POA: Diagnosis not present

## 2014-06-24 DIAGNOSIS — Y9389 Activity, other specified: Secondary | ICD-10-CM | POA: Insufficient documentation

## 2014-06-24 DIAGNOSIS — Z85828 Personal history of other malignant neoplasm of skin: Secondary | ICD-10-CM | POA: Insufficient documentation

## 2014-06-24 DIAGNOSIS — Y9289 Other specified places as the place of occurrence of the external cause: Secondary | ICD-10-CM | POA: Diagnosis not present

## 2014-06-24 DIAGNOSIS — Y998 Other external cause status: Secondary | ICD-10-CM | POA: Diagnosis not present

## 2014-06-24 DIAGNOSIS — S42018A Nondisplaced fracture of sternal end of left clavicle, initial encounter for closed fracture: Secondary | ICD-10-CM | POA: Insufficient documentation

## 2014-06-24 DIAGNOSIS — I771 Stricture of artery: Secondary | ICD-10-CM | POA: Diagnosis not present

## 2014-06-24 DIAGNOSIS — S4992XA Unspecified injury of left shoulder and upper arm, initial encounter: Secondary | ICD-10-CM | POA: Diagnosis present

## 2014-06-24 DIAGNOSIS — S0990XA Unspecified injury of head, initial encounter: Secondary | ICD-10-CM | POA: Diagnosis not present

## 2014-06-24 DIAGNOSIS — R030 Elevated blood-pressure reading, without diagnosis of hypertension: Secondary | ICD-10-CM | POA: Diagnosis not present

## 2014-06-24 DIAGNOSIS — I10 Essential (primary) hypertension: Secondary | ICD-10-CM | POA: Insufficient documentation

## 2014-06-24 DIAGNOSIS — M542 Cervicalgia: Secondary | ICD-10-CM | POA: Diagnosis not present

## 2014-06-24 DIAGNOSIS — W19XXXA Unspecified fall, initial encounter: Secondary | ICD-10-CM

## 2014-06-24 HISTORY — DX: Acquired absence of unspecified breast and nipple: Z90.10

## 2014-06-24 LAB — BASIC METABOLIC PANEL
Anion gap: 11 (ref 5–15)
BUN: 26 mg/dL — ABNORMAL HIGH (ref 6–23)
CHLORIDE: 104 meq/L (ref 96–112)
CO2: 27 mmol/L (ref 19–32)
CREATININE: 1.15 mg/dL — AB (ref 0.50–1.10)
Calcium: 9.3 mg/dL (ref 8.4–10.5)
GFR calc Af Amer: 47 mL/min — ABNORMAL LOW (ref >90)
GFR, EST NON AFRICAN AMERICAN: 41 mL/min — AB (ref >90)
Glucose, Bld: 103 mg/dL — ABNORMAL HIGH (ref 70–99)
Potassium: 4.4 mmol/L (ref 3.5–5.1)
Sodium: 142 mmol/L (ref 135–145)

## 2014-06-24 LAB — CBC
HCT: 35.3 % — ABNORMAL LOW (ref 36.0–46.0)
HEMOGLOBIN: 11.6 g/dL — AB (ref 12.0–15.0)
MCH: 29.9 pg (ref 26.0–34.0)
MCHC: 32.9 g/dL (ref 30.0–36.0)
MCV: 91 fL (ref 78.0–100.0)
PLATELETS: 138 10*3/uL — AB (ref 150–400)
RBC: 3.88 MIL/uL (ref 3.87–5.11)
RDW: 12.8 % (ref 11.5–15.5)
WBC: 6.6 10*3/uL (ref 4.0–10.5)

## 2014-06-24 MED ORDER — TRAMADOL HCL 50 MG PO TABS
50.0000 mg | ORAL_TABLET | Freq: Once | ORAL | Status: AC
Start: 2014-06-24 — End: 2014-06-24
  Administered 2014-06-24: 50 mg via ORAL
  Filled 2014-06-24: qty 1

## 2014-06-24 MED ORDER — FENTANYL CITRATE 0.05 MG/ML IJ SOLN
50.0000 ug | Freq: Once | INTRAMUSCULAR | Status: AC
  Administered 2014-06-24: 50 ug via INTRAVENOUS
  Filled 2014-06-24: qty 2

## 2014-06-24 MED ORDER — TRAMADOL HCL 50 MG PO TABS: 50.0000 mg | ORAL_TABLET | Freq: Four times a day (QID) | ORAL | Status: DC | PRN

## 2014-06-24 NOTE — ED Provider Notes (Signed)
CSN: [id number]     Arrival date & time 06/24/14  1321 History   First MD Initiated Contact with Patient 06/24/14 1345     Chief Complaint  Patient presents with  . Fall     (Consider location/radiation/quality/duration/timing/severity/associated sxs/prior Treatment) Patient is a 81 y.o. female presenting with fall. The history is provided by the patient. No language interpreter was used.  Fall This is a new problem. The current episode started today. The problem occurs constantly. The problem has been gradually worsening. Associated symptoms include myalgias. Pertinent negatives include no neck pain. Nothing aggravates the symptoms. She has tried nothing for the symptoms. The treatment provided moderate relief.  pt fell while talking on phone and hit chest and clavicle on a chair.  Pt denies any chest pain. Pt denies hitting her head. No loss of consciousness.    Past Medical History  Diagnosis Date  . Hypertension   . Cancer    Past Surgical History  Procedure Laterality Date  . Leg amputation    . Breast surgery    . Abdominal hysterectomy     History reviewed. No pertinent family history. History  Substance Use Topics  . Smoking status: Never Smoker   . Smokeless tobacco: Not on file  . Alcohol Use: No   OB History    No data available     Review of Systems  Musculoskeletal: Positive for myalgias. Negative for neck pain.  All other systems reviewed and are negative.     Allergies  Codeine  Home Medications   Prior to Admission medications   Medication Sig Start Date End Date Taking? Authorizing Provider  Hydrocodone-Acetaminophen (VICODIN PO) Take by mouth.    Historical Provider, MD   BP 208/68 mmHg  Pulse 66  Temp(Src) 98.9 F (37.2 C) (Oral)  Ht 5\' 4"  (1.626 m)  Wt 130 lb (58.968 kg)  BMI 22.30 kg/m2 Physical Exam  Constitutional: She is oriented to person, place, and time. She appears well-developed and well-nourished.  HENT:  Head: Normocephalic  and atraumatic.  Eyes: Conjunctivae and EOM are normal. Pupils are equal, round, and reactive to light.  Neck: Normal range of motion.  Cardiovascular: Normal rate.   Pulmonary/Chest: Effort normal.  Abdominal: She exhibits no distension.  Musculoskeletal: She exhibits tenderness.  Tender clavicle at sternoclavicular joint  Large bruise  Neurological: She is alert and oriented to person, place, and time.  Skin: Skin is warm.  Psychiatric: She has a normal mood and affect.  Nursing note and vitals reviewed.   ED Course  Procedures (including critical care time) Labs Review Labs Reviewed - No data to display  Imaging Review No results found.   EKG Interpretation None      MDM   Final diagnoses:  [place], PA-C 06/24/14 [place] Alvino [place], MD 06/24/14 [phone]

## 2014-06-24 NOTE — ED Notes (Signed)
Patient transported to CT 

## 2014-06-24 NOTE — ED Notes (Signed)
Dr.Walden at the bedside.  

## 2014-06-24 NOTE — ED Notes (Signed)
Dr. Mingo Amber aware of BP, treat pain and continue to monitor.

## 2014-06-24 NOTE — Discharge Instructions (Signed)
Clavicle Fracture °The clavicle, also called the collarbone, is the long bone that connects your shoulder to your rib cage. You can feel your collarbone at the top of your shoulders and rib cage. A clavicle fracture is a broken clavicle. It is a common injury that can happen at any age.  °CAUSES °Common causes of a clavicle fracture include: °· A direct blow to your shoulder. °· A car accident. °· A fall, especially if you try to break your fall with an outstretched arm. °RISK FACTORS °You may be at increased risk if: °· You are younger than 25 years or older than 75 years. Most clavicle fractures happen to people who are younger than 25 years. °· You are a female. °· You play contact sports. °SIGNS AND SYMPTOMS °A fractured clavicle is painful. It also makes it hard to move your arm. Other signs and symptoms may include: °· A shoulder that drops downward and forward. °· Pain when trying to lift your shoulder. °· Bruising, swelling, and tenderness over your clavicle. °· A grinding noise when you try to move your shoulder. °· A bump over your clavicle. °DIAGNOSIS °Your health care provider can usually diagnose a clavicle fracture by asking about your injury and examining your shoulder and clavicle. He or she may take an X-ray to determine the position of your clavicle. °TREATMENT °Treatment depends on the position of your clavicle after the fracture: °· If the broken ends of the bone are not out of place, your health care provider may put your arm in a sling or wrap a support bandage around your chest (figure-of-eight wrap). °· If the broken ends of the bone are out of place, you may need surgery. Surgery may involve placing screws, pins, or plates to keep your clavicle stable while it heals. Healing may take about 3 months. °When your health care provider thinks your fracture has healed enough, you may have to do physical therapy to regain normal movement and build up your arm strength. °HOME CARE INSTRUCTIONS   °· Apply ice to the injured area: °¨ Put ice in a plastic bag. °¨ Place a towel between your skin and the bag. °¨ Leave the ice on for 20 minutes, 2-3 times a day. °· If you have a wrap or splint: °¨ Wear it all the time, and remove it only to take a bath or shower. °¨ When you bathe or shower, keep your shoulder in the same position as when the sling or wrap is on. °¨ Do not lift your arm. °· If you have a figure-of-eight wrap: °¨ Another person must tighten it every day. °¨ It should be tight enough to hold your shoulders back. °¨ Allow enough room to place your index finger between your body and the strap. °¨ Loosen the wrap immediately if you feel numbness or tingling in your hands. °· Only take medicines as directed by your health care provider. °· Avoid activities that make the injury or pain worse for 4-6 weeks after surgery. °· Keep all follow-up appointments. °SEEK MEDICAL CARE IF:  °Your medicine is not helping to relieve pain and swelling. °SEEK IMMEDIATE MEDICAL CARE IF:  °Your arm is numb, cold, or pale, even when the splint is loose. °MAKE SURE YOU:  °· Understand these instructions. °· Will watch your condition. °· Will get help right away if you are not doing well or get worse. °Document Released: 03/06/2005 Document Revised: 06/01/2013 Document Reviewed: 04/19/2013 °[employer]® Patient Information ©2015 [employer]. This information is   not intended to replace advice given to you by your health care provider. Make sure you discuss any questions you have with your health care provider.

## 2014-06-24 NOTE — ED Notes (Signed)
Discussed plan of care with family and Dr. Mingo Amber at the bedside. Patient was able to transfer to restroom with assistance with no complaints. Preparing for discharge and patient is to take home medications for BP.

## 2014-06-24 NOTE — ED Notes (Signed)
Per ems, pt w/mechanical fall to left side striking table.  Pt arrives awake, alert, oriented, c/o left shoulder/clavicle pain.  Deformity noted, +cms distal to injury

## 2014-06-24 NOTE — ED Notes (Signed)
MD at bedside. 

## 2014-06-30 DIAGNOSIS — N390 Urinary tract infection, site not specified: Secondary | ICD-10-CM | POA: Diagnosis not present

## 2014-07-06 DIAGNOSIS — S42012A Anterior displaced fracture of sternal end of left clavicle, initial encounter for closed fracture: Secondary | ICD-10-CM | POA: Diagnosis not present

## 2014-07-28 DIAGNOSIS — S42012D Anterior displaced fracture of sternal end of left clavicle, subsequent encounter for fracture with routine healing: Secondary | ICD-10-CM | POA: Diagnosis not present

## 2014-09-12 DIAGNOSIS — I10 Essential (primary) hypertension: Secondary | ICD-10-CM | POA: Diagnosis not present

## 2014-09-12 DIAGNOSIS — E039 Hypothyroidism, unspecified: Secondary | ICD-10-CM | POA: Diagnosis not present

## 2014-09-12 DIAGNOSIS — E785 Hyperlipidemia, unspecified: Secondary | ICD-10-CM | POA: Diagnosis not present

## 2014-09-19 DIAGNOSIS — Z6826 Body mass index (BMI) 26.0-26.9, adult: Secondary | ICD-10-CM | POA: Diagnosis not present

## 2014-09-19 DIAGNOSIS — Z89512 Acquired absence of left leg below knee: Secondary | ICD-10-CM | POA: Diagnosis not present

## 2014-09-19 DIAGNOSIS — N184 Chronic kidney disease, stage 4 (severe): Secondary | ICD-10-CM | POA: Diagnosis not present

## 2014-09-19 DIAGNOSIS — I129 Hypertensive chronic kidney disease with stage 1 through stage 4 chronic kidney disease, or unspecified chronic kidney disease: Secondary | ICD-10-CM | POA: Diagnosis not present

## 2014-09-19 DIAGNOSIS — M5106 Intervertebral disc disorders with myelopathy, lumbar region: Secondary | ICD-10-CM | POA: Diagnosis not present

## 2014-09-19 DIAGNOSIS — I10 Essential (primary) hypertension: Secondary | ICD-10-CM | POA: Diagnosis not present

## 2014-09-19 DIAGNOSIS — Z23 Encounter for immunization: Secondary | ICD-10-CM | POA: Diagnosis not present

## 2014-09-19 DIAGNOSIS — Z0000 Encounter for general adult medical examination without abnormal findings: Secondary | ICD-10-CM | POA: Diagnosis not present

## 2014-09-19 DIAGNOSIS — E785 Hyperlipidemia, unspecified: Secondary | ICD-10-CM | POA: Diagnosis not present

## 2014-09-19 DIAGNOSIS — E039 Hypothyroidism, unspecified: Secondary | ICD-10-CM | POA: Diagnosis not present

## 2014-09-19 DIAGNOSIS — M1990 Unspecified osteoarthritis, unspecified site: Secondary | ICD-10-CM | POA: Diagnosis not present

## 2015-03-22 DIAGNOSIS — M1990 Unspecified osteoarthritis, unspecified site: Secondary | ICD-10-CM | POA: Diagnosis not present

## 2015-03-22 DIAGNOSIS — I10 Essential (primary) hypertension: Secondary | ICD-10-CM | POA: Diagnosis not present

## 2015-03-22 DIAGNOSIS — Z6825 Body mass index (BMI) 25.0-25.9, adult: Secondary | ICD-10-CM | POA: Diagnosis not present

## 2015-03-22 DIAGNOSIS — Z23 Encounter for immunization: Secondary | ICD-10-CM | POA: Diagnosis not present

## 2015-03-22 DIAGNOSIS — I129 Hypertensive chronic kidney disease with stage 1 through stage 4 chronic kidney disease, or unspecified chronic kidney disease: Secondary | ICD-10-CM | POA: Diagnosis not present

## 2015-03-22 DIAGNOSIS — N184 Chronic kidney disease, stage 4 (severe): Secondary | ICD-10-CM | POA: Diagnosis not present

## 2015-03-22 DIAGNOSIS — D638 Anemia in other chronic diseases classified elsewhere: Secondary | ICD-10-CM | POA: Diagnosis not present

## 2015-03-22 DIAGNOSIS — K5909 Other constipation: Secondary | ICD-10-CM | POA: Diagnosis not present

## 2015-03-22 DIAGNOSIS — C50919 Malignant neoplasm of unspecified site of unspecified female breast: Secondary | ICD-10-CM | POA: Diagnosis not present

## 2015-03-22 DIAGNOSIS — E785 Hyperlipidemia, unspecified: Secondary | ICD-10-CM | POA: Diagnosis not present

## 2015-03-22 DIAGNOSIS — E039 Hypothyroidism, unspecified: Secondary | ICD-10-CM | POA: Diagnosis not present

## 2015-09-11 DIAGNOSIS — R8290 Unspecified abnormal findings in urine: Secondary | ICD-10-CM | POA: Diagnosis not present

## 2015-09-11 DIAGNOSIS — I10 Essential (primary) hypertension: Secondary | ICD-10-CM | POA: Diagnosis not present

## 2015-09-11 DIAGNOSIS — E039 Hypothyroidism, unspecified: Secondary | ICD-10-CM | POA: Diagnosis not present

## 2015-09-11 DIAGNOSIS — E784 Other hyperlipidemia: Secondary | ICD-10-CM | POA: Diagnosis not present

## 2015-09-18 DIAGNOSIS — I129 Hypertensive chronic kidney disease with stage 1 through stage 4 chronic kidney disease, or unspecified chronic kidney disease: Secondary | ICD-10-CM | POA: Diagnosis not present

## 2015-09-18 DIAGNOSIS — I629 Nontraumatic intracranial hemorrhage, unspecified: Secondary | ICD-10-CM | POA: Diagnosis not present

## 2015-09-18 DIAGNOSIS — Z1389 Encounter for screening for other disorder: Secondary | ICD-10-CM | POA: Diagnosis not present

## 2015-09-18 DIAGNOSIS — G547 Phantom limb syndrome without pain: Secondary | ICD-10-CM | POA: Diagnosis not present

## 2015-09-18 DIAGNOSIS — D638 Anemia in other chronic diseases classified elsewhere: Secondary | ICD-10-CM | POA: Diagnosis not present

## 2015-09-18 DIAGNOSIS — C50919 Malignant neoplasm of unspecified site of unspecified female breast: Secondary | ICD-10-CM | POA: Diagnosis not present

## 2015-09-18 DIAGNOSIS — N184 Chronic kidney disease, stage 4 (severe): Secondary | ICD-10-CM | POA: Diagnosis not present

## 2015-09-18 DIAGNOSIS — Z6826 Body mass index (BMI) 26.0-26.9, adult: Secondary | ICD-10-CM | POA: Diagnosis not present

## 2015-09-18 DIAGNOSIS — K5909 Other constipation: Secondary | ICD-10-CM | POA: Diagnosis not present

## 2015-09-18 DIAGNOSIS — Z0000 Encounter for general adult medical examination without abnormal findings: Secondary | ICD-10-CM | POA: Diagnosis not present

## 2015-09-18 DIAGNOSIS — M5106 Intervertebral disc disorders with myelopathy, lumbar region: Secondary | ICD-10-CM | POA: Diagnosis not present

## 2015-09-18 DIAGNOSIS — Z89512 Acquired absence of left leg below knee: Secondary | ICD-10-CM | POA: Diagnosis not present

## 2016-04-08 DIAGNOSIS — R269 Unspecified abnormalities of gait and mobility: Secondary | ICD-10-CM | POA: Diagnosis not present

## 2016-04-08 DIAGNOSIS — I629 Nontraumatic intracranial hemorrhage, unspecified: Secondary | ICD-10-CM | POA: Diagnosis not present

## 2016-04-08 DIAGNOSIS — N183 Chronic kidney disease, stage 3 (moderate): Secondary | ICD-10-CM | POA: Diagnosis not present

## 2016-04-08 DIAGNOSIS — Z23 Encounter for immunization: Secondary | ICD-10-CM | POA: Diagnosis not present

## 2016-04-08 DIAGNOSIS — G547 Phantom limb syndrome without pain: Secondary | ICD-10-CM | POA: Diagnosis not present

## 2016-04-08 DIAGNOSIS — D638 Anemia in other chronic diseases classified elsewhere: Secondary | ICD-10-CM | POA: Diagnosis not present

## 2016-04-08 DIAGNOSIS — Z6825 Body mass index (BMI) 25.0-25.9, adult: Secondary | ICD-10-CM | POA: Diagnosis not present

## 2016-04-08 DIAGNOSIS — C50919 Malignant neoplasm of unspecified site of unspecified female breast: Secondary | ICD-10-CM | POA: Diagnosis not present

## 2016-04-08 DIAGNOSIS — M5106 Intervertebral disc disorders with myelopathy, lumbar region: Secondary | ICD-10-CM | POA: Diagnosis not present

## 2016-04-08 DIAGNOSIS — R51 Headache: Secondary | ICD-10-CM | POA: Diagnosis not present

## 2016-04-08 DIAGNOSIS — I129 Hypertensive chronic kidney disease with stage 1 through stage 4 chronic kidney disease, or unspecified chronic kidney disease: Secondary | ICD-10-CM | POA: Diagnosis not present

## 2016-04-08 DIAGNOSIS — Z89512 Acquired absence of left leg below knee: Secondary | ICD-10-CM | POA: Diagnosis not present

## 2016-06-23 ENCOUNTER — Emergency Department (HOSPITAL_COMMUNITY): Payer: Medicare Other

## 2016-06-23 ENCOUNTER — Encounter (HOSPITAL_COMMUNITY): Payer: Self-pay | Admitting: Emergency Medicine

## 2016-06-23 ENCOUNTER — Emergency Department (HOSPITAL_COMMUNITY)
Admission: EM | Admit: 2016-06-23 | Discharge: 2016-06-23 | Disposition: A | Discharge status: Home / Self Care | Payer: Medicare Other | Attending: Emergency Medicine | Admitting: Emergency Medicine

## 2016-06-23 DIAGNOSIS — I10 Essential (primary) hypertension: Secondary | ICD-10-CM | POA: Diagnosis not present

## 2016-06-23 DIAGNOSIS — W010XXA Fall on same level from slipping, tripping and stumbling without subsequent striking against object, initial encounter: Secondary | ICD-10-CM | POA: Diagnosis not present

## 2016-06-23 DIAGNOSIS — M7989 Other specified soft tissue disorders: Secondary | ICD-10-CM | POA: Diagnosis not present

## 2016-06-23 DIAGNOSIS — S0083XA Contusion of other part of head, initial encounter: Secondary | ICD-10-CM

## 2016-06-23 DIAGNOSIS — S0993XA Unspecified injury of face, initial encounter: Secondary | ICD-10-CM | POA: Diagnosis not present

## 2016-06-23 DIAGNOSIS — Y939 Activity, unspecified: Secondary | ICD-10-CM | POA: Diagnosis not present

## 2016-06-23 DIAGNOSIS — Z853 Personal history of malignant neoplasm of breast: Secondary | ICD-10-CM | POA: Diagnosis not present

## 2016-06-23 DIAGNOSIS — L03115 Cellulitis of right lower limb: Secondary | ICD-10-CM

## 2016-06-23 DIAGNOSIS — Y92009 Unspecified place in unspecified non-institutional (private) residence as the place of occurrence of the external cause: Secondary | ICD-10-CM | POA: Insufficient documentation

## 2016-06-23 DIAGNOSIS — S8001XA Contusion of right knee, initial encounter: Secondary | ICD-10-CM | POA: Insufficient documentation

## 2016-06-23 DIAGNOSIS — S199XXA Unspecified injury of neck, initial encounter: Secondary | ICD-10-CM | POA: Diagnosis not present

## 2016-06-23 DIAGNOSIS — S0012XA Contusion of left eyelid and periocular area, initial encounter: Secondary | ICD-10-CM | POA: Diagnosis not present

## 2016-06-23 DIAGNOSIS — S0990XA Unspecified injury of head, initial encounter: Secondary | ICD-10-CM | POA: Diagnosis not present

## 2016-06-23 DIAGNOSIS — Y999 Unspecified external cause status: Secondary | ICD-10-CM | POA: Insufficient documentation

## 2016-06-23 LAB — URINALYSIS, ROUTINE W REFLEX MICROSCOPIC
BACTERIA UA: NONE SEEN
Bilirubin Urine: NEGATIVE
Glucose, UA: NEGATIVE mg/dL
Hgb urine dipstick: NEGATIVE
KETONES UR: NEGATIVE mg/dL
Nitrite: NEGATIVE
PROTEIN: NEGATIVE mg/dL
Specific Gravity, Urine: 1.018 (ref 1.005–1.030)
pH: 5 (ref 5.0–8.0)

## 2016-06-23 LAB — CBC WITH DIFFERENTIAL/PLATELET
BASOS ABS: 0 10*3/uL (ref 0.0–0.1)
BASOS PCT: 0 %
Eosinophils Absolute: 0.3 10*3/uL (ref 0.0–0.7)
Eosinophils Relative: 4 %
HCT: 28.8 % — ABNORMAL LOW (ref 36.0–46.0)
Hemoglobin: 9.2 g/dL — ABNORMAL LOW (ref 12.0–15.0)
Lymphocytes Relative: 21 %
Lymphs Abs: 1.5 10*3/uL (ref 0.7–4.0)
MCH: 29.5 pg (ref 26.0–34.0)
MCHC: 31.9 g/dL (ref 30.0–36.0)
MCV: 92.3 fL (ref 78.0–100.0)
Monocytes Absolute: 0.6 10*3/uL (ref 0.1–1.0)
Monocytes Relative: 9 %
NEUTROS ABS: 4.5 10*3/uL (ref 1.7–7.7)
Neutrophils Relative %: 66 %
Platelets: 160 10*3/uL (ref 150–400)
RBC: 3.12 MIL/uL — AB (ref 3.87–5.11)
RDW: 12.9 % (ref 11.5–15.5)
WBC: 6.9 10*3/uL (ref 4.0–10.5)

## 2016-06-23 LAB — BASIC METABOLIC PANEL
ANION GAP: 7 (ref 5–15)
BUN: 17 mg/dL (ref 6–20)
CALCIUM: 8.7 mg/dL — AB (ref 8.9–10.3)
CO2: 29 mmol/L (ref 22–32)
Chloride: 104 mmol/L (ref 101–111)
Creatinine, Ser: 1.04 mg/dL — ABNORMAL HIGH (ref 0.44–1.00)
GFR, EST AFRICAN AMERICAN: 52 mL/min — AB (ref >60)
GFR, EST NON AFRICAN AMERICAN: 45 mL/min — AB (ref >60)
GLUCOSE: 101 mg/dL — AB (ref 65–99)
POTASSIUM: 4.2 mmol/L (ref 3.5–5.1)
Sodium: 140 mmol/L (ref 135–145)

## 2016-06-23 MED ORDER — VANCOMYCIN HCL IN DEXTROSE 1-5 GM/200ML-% IV SOLN
1000.0000 mg | Freq: Once | INTRAVENOUS | Status: AC
  Administered 2016-06-23: 1000 mg via INTRAVENOUS
  Filled 2016-06-23: qty 200

## 2016-06-23 MED ORDER — DOXYCYCLINE HYCLATE 100 MG PO CAPS: 100.0000 mg | ORAL_CAPSULE | Freq: Two times a day (BID) | ORAL | 0 refills | Status: DC

## 2016-06-23 MED ORDER — DIPHENHYDRAMINE HCL 50 MG/ML IJ SOLN
25.0000 mg | Freq: Once | INTRAMUSCULAR | Status: AC
  Administered 2016-06-23: 25 mg via INTRAVENOUS
  Filled 2016-06-23: qty 1

## 2016-06-23 NOTE — ED Triage Notes (Signed)
Pt brought in by family with c/o fall on Tuesday causing injury to face and left leg. Pt presents with bruising to forehead and B/L eyes. Pt has redness and warmth to left leg. Pt last had Vicodin today about 0930

## 2016-06-23 NOTE — ED Provider Notes (Signed)
Medical screening examination/treatment/procedure(s) were conducted as a shared visit with non-physician practitioner(s) and myself.  I personally evaluated the patient during the encounter.   EKG Interpretation None       Patient seen by me along with physician assistant. Patient lives by herself but family is nearby. Patient had a fall during the night on Tuesday. Patient tripped. Landing on her face and injuring her left leg. 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   Patient with marked bruising to the face and swelling superficial laceration to the 4 head. Patient's right leg also with swelling and edema bruising around the knee. No evidence of any trauma to the ankle or foot.  CT head face and neck without any acute findings. X-rays of the right leg without any bony injuries. There is erythema associated with the right leg prickly at the ankle and a little more proximal knee anterior part of the shin. Suggestive of cellulitis. There is increased warmth as well. Clinically no concerns for DVT.   We'll go ahead and treat the leg for probable cellulitis here with an IV dose of vancomycin and then oral doxycycline at home. In the follow-up with her primary care doctor.  Patient is not on any  Blood thinner medicine.  Results for orders placed or performed during the hospital encounter of 06/23/16  Urinalysis, Routine w reflex microscopic  Result Value Ref Range   Color, Urine YELLOW YELLOW   APPearance CLEAR CLEAR   Specific Gravity, Urine 1.018 1.005 - 1.030   pH 5.0 5.0 - 8.0   Glucose, UA NEGATIVE NEGATIVE mg/dL   Hgb urine dipstick NEGATIVE NEGATIVE   Bilirubin Urine NEGATIVE NEGATIVE   Ketones, ur NEGATIVE NEGATIVE mg/dL   Protein, ur NEGATIVE NEGATIVE mg/dL   Nitrite NEGATIVE NEGATIVE   Leukocytes, UA TRACE (A) NEGATIVE   RBC / HPF 0-5 0 - 5 RBC/hpf   WBC, UA 0-5 0 - 5 WBC/hpf   Bacteria, UA NONE SEEN NONE SEEN   Squamous Epithelial / LPF 0-5 (A) NONE SEEN   Mucous PRESENT    Dg Ankle  Complete Right  Result Date: 06/23/2016 CLINICAL DATA:  Right lower leg swelling, redness and increased warmth following fall 5 days ago. EXAM: RIGHT ANKLE - COMPLETE 3+ VIEW COMPARISON:  None. FINDINGS: Diffuse soft tissue swelling. No fracture, dislocation or effusion seen. Diffuse osteopenia. Calcaneal spurs. IMPRESSION: Diffuse soft tissue swelling without fracture. Electronically Signed   By: Claudie Revering M.D.   On: 06/23/2016 15:26   Ct Head Wo Contrast  Result Date: 06/23/2016 CLINICAL DATA:  FALL Tuesday WITH BRUISING TO MAJORITY OF FACERIGHT LEG INJURYPMH: HTN, BREAST CA AND H/O mastectomy EXAM: CT HEAD WITHOUT CONTRAST CT MAXILLOFACIAL WITHOUT CONTRAST CT CERVICAL SPINE WITHOUT CONTRAST TECHNIQUE: Multidetector CT imaging of the head, cervical spine, and maxillofacial structures were performed using the standard protocol without intravenous contrast. Multiplanar CT image reconstructions of the cervical spine and maxillofacial structures were also generated. COMPARISON:  06/14/2014 FINDINGS: CT HEAD FINDINGS Brain: No evidence of acute infarction, hemorrhage, hydrocephalus, extra-axial collection or mass lesion/mass effect. There is ventricular and sulcal enlargement reflecting age related volume loss. Patchy white matter hypoattenuation is noted consistent with moderate chronic microvascular ischemic change. Vascular: No hyperdense vessel or unexpected calcification. Skull: Normal. Negative for fracture or focal lesion. Other: Frontal scalp hematoma. CT MAXILLOFACIAL FINDINGS Osseous: No fracture.  No bone lesion. Orbits: Status post bilateral cataract surgery. Globes and orbits are otherwise unremarkable. Sinuses: Clear sinuses, middle ear cavities and mastoid air cells.  Soft tissues: Mild left infraorbital/ cheek subcutaneous soft tissue edema. No formed hematoma. No soft tissue masses or adenopathy. CT CERVICAL SPINE FINDINGS Alignment: Straightening of the normal cervical lordosis. No  spondylolisthesis. Skull base and vertebrae: No acute fracture. No primary bone lesion or focal pathologic process. Soft tissues and spinal canal: No prevertebral fluid or swelling. No visible canal hematoma. No left submandibular gland seen, presumably this has been surgically removed. No neck masses or adenopathy. Disc levels: Disc degenerative changes are noted throughout the cervical spine. There is moderate-to-marked loss of disc height, greatest at C5-C6 and C6-C7. There is mild spondylotic disc bulging with endplate spurring. Facet degenerative changes greatest on the left at C3-C4 through C5-C6. No convincing disc herniation. Upper chest: Unremarkable. Other: None. IMPRESSION: HEAD CT: No acute intracranial abnormalities. No skull fracture. Right midline frontal scalp hematoma. MAXILLOFACIAL CT:  No fractures. CERVICAL CT:  No fracture or acute finding. Electronically Signed   By: Lajean Manes M.D.   On: 06/23/2016 15:09   Ct Cervical Spine Wo Contrast  Result Date: 06/23/2016 CLINICAL DATA:  FALL Tuesday WITH BRUISING TO MAJORITY OF FACERIGHT LEG INJURYPMH: HTN, BREAST CA AND H/O mastectomy EXAM: CT HEAD WITHOUT CONTRAST CT MAXILLOFACIAL WITHOUT CONTRAST CT CERVICAL SPINE WITHOUT CONTRAST TECHNIQUE: Multidetector CT imaging of the head, cervical spine, and maxillofacial structures were performed using the standard protocol without intravenous contrast. Multiplanar CT image reconstructions of the cervical spine and maxillofacial structures were also generated. COMPARISON:  06/14/2014 FINDINGS: CT HEAD FINDINGS Brain: No evidence of acute infarction, hemorrhage, hydrocephalus, extra-axial collection or mass lesion/mass effect. There is ventricular and sulcal enlargement reflecting age related volume loss. Patchy white matter hypoattenuation is noted consistent with moderate chronic microvascular ischemic change. Vascular: No hyperdense vessel or unexpected calcification. Skull: Normal. Negative for  fracture or focal lesion. Other: Frontal scalp hematoma. CT MAXILLOFACIAL FINDINGS Osseous: No fracture.  No bone lesion. Orbits: Status post bilateral cataract surgery. Globes and orbits are otherwise unremarkable. Sinuses: Clear sinuses, middle ear cavities and mastoid air cells. Soft tissues: Mild left infraorbital/ cheek subcutaneous soft tissue edema. No formed hematoma. No soft tissue masses or adenopathy. CT CERVICAL SPINE FINDINGS Alignment: Straightening of the normal cervical lordosis. No spondylolisthesis. Skull base and vertebrae: No acute fracture. No primary bone lesion or focal pathologic process. Soft tissues and spinal canal: No prevertebral fluid or swelling. No visible canal hematoma. No left submandibular gland seen, presumably this has been surgically removed. No neck masses or adenopathy. Disc levels: Disc degenerative changes are noted throughout the cervical spine. There is moderate-to-marked loss of disc height, greatest at C5-C6 and C6-C7. There is mild spondylotic disc bulging with endplate spurring. Facet degenerative changes greatest on the left at C3-C4 through C5-C6. No convincing disc herniation. Upper chest: Unremarkable. Other: None. IMPRESSION: HEAD CT: No acute intracranial abnormalities. No skull fracture. Right midline frontal scalp hematoma. MAXILLOFACIAL CT:  No fractures. CERVICAL CT:  No fracture or acute finding. Electronically Signed   By: Lajean Manes M.D.   On: 06/23/2016 15:09   Dg Knee Complete 4 Views Right  Result Date: 06/23/2016 CLINICAL DATA:  Initial encounter for Pt brought in by family with c/o fall on Tuesday causing injury to face and right leg. Pt presents with bruising to forehead and B/L eyes. Pt has redness, warmth, and swelling to right leg from knee down to foot. EXAM: RIGHT KNEE - COMPLETE 4+ VIEW COMPARISON:  None. FINDINGS: Mild medial and patellofemoral compartment joint space narrowing with osteophyte formation. Prepatellar  soft tissue  swelling. No joint effusion. No acute fracture or dislocation. Diffuse subcutaneous edema is nonspecific. IMPRESSION: Diffuse soft tissue swelling/subcutaneous edema. Mild osteoarthritis, without acute osseous abnormality. Electronically Signed   By: Abigail Miyamoto M.D.   On: 06/23/2016 15:20   Ct Maxillofacial Wo Contrast  Result Date: 06/23/2016 CLINICAL DATA:  FALL Tuesday WITH BRUISING TO MAJORITY OF FACERIGHT LEG INJURYPMH: HTN, BREAST CA AND H/O mastectomy EXAM: CT HEAD WITHOUT CONTRAST CT MAXILLOFACIAL WITHOUT CONTRAST CT CERVICAL SPINE WITHOUT CONTRAST TECHNIQUE: Multidetector CT imaging of the head, cervical spine, and maxillofacial structures were performed using the standard protocol without intravenous contrast. Multiplanar CT image reconstructions of the cervical spine and maxillofacial structures were also generated. COMPARISON:  06/14/2014 FINDINGS: CT HEAD FINDINGS Brain: No evidence of acute infarction, hemorrhage, hydrocephalus, extra-axial collection or mass lesion/mass effect. There is ventricular and sulcal enlargement reflecting age related volume loss. Patchy white matter hypoattenuation is noted consistent with moderate chronic microvascular ischemic change. Vascular: No hyperdense vessel or unexpected calcification. Skull: Normal. Negative for fracture or focal lesion. Other: Frontal scalp hematoma. CT MAXILLOFACIAL FINDINGS Osseous: No fracture.  No bone lesion. Orbits: Status post bilateral cataract surgery. Globes and orbits are otherwise unremarkable. Sinuses: Clear sinuses, middle ear cavities and mastoid air cells. Soft tissues: Mild left infraorbital/ cheek subcutaneous soft tissue edema. No formed hematoma. No soft tissue masses or adenopathy. CT CERVICAL SPINE FINDINGS Alignment: Straightening of the normal cervical lordosis. No spondylolisthesis. Skull base and vertebrae: No acute fracture. No primary bone lesion or focal pathologic process. Soft tissues and spinal canal: No  prevertebral fluid or swelling. No visible canal hematoma. No left submandibular gland seen, presumably this has been surgically removed. No neck masses or adenopathy. Disc levels: Disc degenerative changes are noted throughout the cervical spine. There is moderate-to-marked loss of disc height, greatest at C5-C6 and C6-C7. There is mild spondylotic disc bulging with endplate spurring. Facet degenerative changes greatest on the left at C3-C4 through C5-C6. No convincing disc herniation. Upper chest: Unremarkable. Other: None. IMPRESSION: HEAD CT: No acute intracranial abnormalities. No skull fracture. Right midline frontal scalp hematoma. MAXILLOFACIAL CT:  No fractures. CERVICAL CT:  No fracture or acute finding. Electronically Signed   By: Lajean Manes M.D.   On: 06/23/2016 15:09      Fredia Sorrow, MD 06/23/16 1552

## 2016-06-23 NOTE — ED Provider Notes (Signed)
[facility] DEPT Provider Note   CSN: [id number] Arrival date & time: 06/23/16  1127     History   Chief Complaint Chief Complaint  Patient presents with  . Fall  . Leg Pain  . Leg Injury  . Head Injury    HPI Alexandra Alexander is a 83 y.o. female who presents with a fall. PMH significant for hx of breast cancer, HTN, left AKA. She states that 5 days ago she went to make sure the door was closed at her home and tripped on a step and fell forward in to a brick wall. She states she did not fall but hit he head and right knee on the wall. She was able to get back to bed and call her daughter to come check on her. Her daughter dressed her wounds and helped her get back to bed. Over the neck couple days she had worsening bruising on her face. She also started to complain of neck pain and right knee pain. Over the next couple days she developed redness and swelling to her right lower leg. Denies fever, chills, headache, lightheadedness/dizziness, weakness, chest pain, SOB, abdominal pain, N/V, dysuria. She has good social support and has someone come and visit with her, bring her food, and give her her medicines daily. She is not on blood thinners.   HPI  Past Medical History:  Diagnosis Date  . Cancer [facility]) breast  . H/O mastectomy right  . Hypertension     There are no active problems to display for this patient.   Past Surgical History:  Procedure Laterality Date  . ABDOMINAL HYSTERECTOMY    . BREAST SURGERY    . LEG AMPUTATION      OB History    No data available       Home Medications    Prior to Admission medications   Medication Sig Start Date End Date Taking? Authorizing Provider  calcium carbonate (TUMS - DOSED IN MG ELEMENTAL CALCIUM) 500 MG chewable tablet Chew 1 tablet by mouth daily.    Historical Provider, MD  escitalopram (LEXAPRO) 10 MG tablet Take 10 mg by mouth daily.    Historical Provider, MD  fentaNYL (DURAGESIC - DOSED MCG/HR) 50 MCG/HR Place 50  mcg onto the skin every 3 (three) days.    Historical Provider, MD  HYDROcodone-acetaminophen (VICODIN) 5-500 MG per tablet Take 1 tablet by mouth every 6 (six) hours as needed for pain.    Historical Provider, MD  levothyroxine (SYNTHROID, LEVOTHROID) 50 MCG tablet Take 50 mcg by mouth daily before breakfast.    Historical Provider, MD  losartan (COZAAR) 100 MG tablet Take 100 mg by mouth daily.    Historical Provider, MD  lubiprostone (AMITIZA) 8 MCG capsule Take 8 mcg by mouth 2 (two) times daily with a meal.    Historical Provider, MD  Multiple Vitamins-Minerals (MULTIVITAMIN WITH MINERALS) tablet Take 1 tablet by mouth daily.    Historical Provider, MD  nebivolol (BYSTOLIC) 10 MG tablet Take 10 mg by mouth daily.    Historical Provider, MD  Omega-3 Fatty Acids (FISH OIL) 1000 MG CAPS Take 1,000 mg by mouth daily.    Historical Provider, MD  Specialty Vitamins Products (ICAPS LUTEIN-ZEAXANTHIN PO) Take 1 tablet by mouth 2 (two) times daily.    Historical Provider, MD  traMADol (ULTRAM) 50 MG tablet Take 1 tablet (50 mg total) by mouth every 6 (six) hours as needed for moderate pain. 06/24/14   Evelina Bucy, MD    Family  History No family history on file.  Social History Social History  Substance Use Topics  . Smoking status: Never Smoker  . Smokeless tobacco: Never Used  . Alcohol use No     Allergies   Codeine   Review of Systems Review of Systems  Constitutional: Negative for chills and fever.  Respiratory: Negative for shortness of breath.   Cardiovascular: Negative for chest pain.  Gastrointestinal: Negative for abdominal pain, nausea and vomiting.  Genitourinary: Negative for dysuria.  Musculoskeletal: Positive for arthralgias, joint swelling, myalgias and neck pain. Negative for back pain and gait problem.  Skin: Positive for color change. Negative for wound.  Neurological: Negative for dizziness, syncope, weakness, light-headedness and headaches.  All other systems  reviewed and are negative.    Physical Exam Updated Vital Signs BP 177/79   Pulse (!) 50   Temp 97.8 F (36.6 C) (Oral)   Resp 16   Ht 5\' 2"  (1.575 m)   Wt 59 kg   SpO2 99%   BMI 23.78 kg/m   Physical Exam  Constitutional: She is oriented to person, place, and time. She appears well-developed and well-nourished. No distress.  HENT:  Head: Normocephalic.  Right Ear: Hearing, tympanic membrane, external ear and ear canal normal.  Left Ear: Hearing, tympanic membrane, external ear and ear canal normal.  Nose: No epistaxis.  Mouth/Throat: Uvula is midline, oropharynx is clear and moist and mucous membranes are normal.  Significant bruising of the face around the forehead, and left eye.   Eyes: Conjunctivae are normal. Pupils are equal, round, and reactive to light. Right eye exhibits no discharge. Left eye exhibits no discharge. No scleral icterus.  Neck: Normal range of motion.  No midline tenderness  Cardiovascular: Normal rate and regular rhythm.  Exam reveals no gallop and no friction rub.   No murmur heard. Pulmonary/Chest: Effort normal and breath sounds normal. No respiratory distress. She has no wheezes. She has no rales. She exhibits no tenderness.  Abdominal: Soft. Bowel sounds are normal. She exhibits no distension and no mass. There is no tenderness. There is no rebound and no guarding. No hernia.  Musculoskeletal:  Right lower leg: Diffuse swelling. Ecchymosis of knee. Redness of anterior knee and ankle. Tenderness of anterior knee and ankle. FROM of knee and ankle. N/V intact.   Neurological: She is alert and oriented to person, place, and time.  Skin: Skin is warm and dry.  Psychiatric: She has a normal mood and affect. Her behavior is normal.  Nursing note and vitals reviewed.    ED Treatments / Results  Labs (all labs ordered are listed, but only abnormal results are displayed) Labs Reviewed  URINALYSIS, ROUTINE W REFLEX MICROSCOPIC - Abnormal; Notable for  the following:       Result Value   Leukocytes, UA TRACE (*)    Squamous Epithelial / LPF 0-5 (*)    All other components within normal limits  CBC WITH DIFFERENTIAL/PLATELET - Abnormal; Notable for the following:    RBC 3.12 (*)    Hemoglobin 9.2 (*)    HCT 28.8 (*)    All other components within normal limits  BASIC METABOLIC PANEL    EKG  EKG Interpretation None       Radiology Dg Ankle Complete Right  Result Date: 06/23/2016 CLINICAL DATA:  Right lower leg swelling, redness and increased warmth following fall 5 days ago. EXAM: RIGHT ANKLE - COMPLETE 3+ VIEW COMPARISON:  None. FINDINGS: Diffuse soft tissue swelling. No fracture, dislocation or  effusion seen. Diffuse osteopenia. Calcaneal spurs. IMPRESSION: Diffuse soft tissue swelling without fracture. Electronically Signed   By: Claudie Revering M.D.   On: 06/23/2016 15:26   Ct Head Wo Contrast  Result Date: 06/23/2016 CLINICAL DATA:  FALL Tuesday WITH BRUISING TO MAJORITY OF FACERIGHT LEG INJURYPMH: HTN, BREAST CA AND H/O mastectomy EXAM: CT HEAD WITHOUT CONTRAST CT MAXILLOFACIAL WITHOUT CONTRAST CT CERVICAL SPINE WITHOUT CONTRAST TECHNIQUE: Multidetector CT imaging of the head, cervical spine, and maxillofacial structures were performed using the standard protocol without intravenous contrast. Multiplanar CT image reconstructions of the cervical spine and maxillofacial structures were also generated. COMPARISON:  06/14/2014 FINDINGS: CT HEAD FINDINGS Brain: No evidence of acute infarction, hemorrhage, hydrocephalus, extra-axial collection or mass lesion/mass effect. There is ventricular and sulcal enlargement reflecting age related volume loss. Patchy white matter hypoattenuation is noted consistent with moderate chronic microvascular ischemic change. Vascular: No hyperdense vessel or unexpected calcification. Skull: Normal. Negative for fracture or focal lesion. Other: Frontal scalp hematoma. CT MAXILLOFACIAL FINDINGS Osseous: No  fracture.  No bone lesion. Orbits: Status post bilateral cataract surgery. Globes and orbits are otherwise unremarkable. Sinuses: Clear sinuses, middle ear cavities and mastoid air cells. Soft tissues: Mild left infraorbital/ cheek subcutaneous soft tissue edema. No formed hematoma. No soft tissue masses or adenopathy. CT CERVICAL SPINE FINDINGS Alignment: Straightening of the normal cervical lordosis. No spondylolisthesis. Skull base and vertebrae: No acute fracture. No primary bone lesion or focal pathologic process. Soft tissues and spinal canal: No prevertebral fluid or swelling. No visible canal hematoma. No left submandibular gland seen, presumably this has been surgically removed. No neck masses or adenopathy. Disc levels: Disc degenerative changes are noted throughout the cervical spine. There is moderate-to-marked loss of disc height, greatest at C5-C6 and C6-C7. There is mild spondylotic disc bulging with endplate spurring. Facet degenerative changes greatest on the left at C3-C4 through C5-C6. No convincing disc herniation. Upper chest: Unremarkable. Other: None. IMPRESSION: HEAD CT: No acute intracranial abnormalities. No skull fracture. Right midline frontal scalp hematoma. MAXILLOFACIAL CT:  No fractures. CERVICAL CT:  No fracture or acute finding. Electronically Signed   By: Lajean Manes M.D.   On: 06/23/2016 15:09   Ct Cervical Spine Wo Contrast  Result Date: 06/23/2016 CLINICAL DATA:  FALL Tuesday WITH BRUISING TO MAJORITY OF FACERIGHT LEG INJURYPMH: HTN, BREAST CA AND H/O mastectomy EXAM: CT HEAD WITHOUT CONTRAST CT MAXILLOFACIAL WITHOUT CONTRAST CT CERVICAL SPINE WITHOUT CONTRAST TECHNIQUE: Multidetector CT imaging of the head, cervical spine, and maxillofacial structures were performed using the standard protocol without intravenous contrast. Multiplanar CT image reconstructions of the cervical spine and maxillofacial structures were also generated. COMPARISON:  06/14/2014 FINDINGS: CT HEAD  FINDINGS Brain: No evidence of acute infarction, hemorrhage, hydrocephalus, extra-axial collection or mass lesion/mass effect. There is ventricular and sulcal enlargement reflecting age related volume loss. Patchy white matter hypoattenuation is noted consistent with moderate chronic microvascular ischemic change. Vascular: No hyperdense vessel or unexpected calcification. Skull: Normal. Negative for fracture or focal lesion. Other: Frontal scalp hematoma. CT MAXILLOFACIAL FINDINGS Osseous: No fracture.  No bone lesion. Orbits: Status post bilateral cataract surgery. Globes and orbits are otherwise unremarkable. Sinuses: Clear sinuses, middle ear cavities and mastoid air cells. Soft tissues: Mild left infraorbital/ cheek subcutaneous soft tissue edema. No formed hematoma. No soft tissue masses or adenopathy. CT CERVICAL SPINE FINDINGS Alignment: Straightening of the normal cervical lordosis. No spondylolisthesis. Skull base and vertebrae: No acute fracture. No primary bone lesion or focal pathologic process. Soft tissues and spinal  canal: No prevertebral fluid or swelling. No visible canal hematoma. No left submandibular gland seen, presumably this has been surgically removed. No neck masses or adenopathy. Disc levels: Disc degenerative changes are noted throughout the cervical spine. There is moderate-to-marked loss of disc height, greatest at C5-C6 and C6-C7. There is mild spondylotic disc bulging with endplate spurring. Facet degenerative changes greatest on the left at C3-C4 through C5-C6. No convincing disc herniation. Upper chest: Unremarkable. Other: None. IMPRESSION: HEAD CT: No acute intracranial abnormalities. No skull fracture. Right midline frontal scalp hematoma. MAXILLOFACIAL CT:  No fractures. CERVICAL CT:  No fracture or acute finding. Electronically Signed   By: Lajean Manes M.D.   On: 06/23/2016 15:09   Dg Knee Complete 4 Views Right  Result Date: 06/23/2016 CLINICAL DATA:  Initial encounter  for Pt brought in by family with c/o fall on Tuesday causing injury to face and right leg. Pt presents with bruising to forehead and B/L eyes. Pt has redness, warmth, and swelling to right leg from knee down to foot. EXAM: RIGHT KNEE - COMPLETE 4+ VIEW COMPARISON:  None. FINDINGS: Mild medial and patellofemoral compartment joint space narrowing with osteophyte formation. Prepatellar soft tissue swelling. No joint effusion. No acute fracture or dislocation. Diffuse subcutaneous edema is nonspecific. IMPRESSION: Diffuse soft tissue swelling/subcutaneous edema. Mild osteoarthritis, without acute osseous abnormality. Electronically Signed   By: Abigail Miyamoto M.D.   On: 06/23/2016 15:20   Ct Maxillofacial Wo Contrast  Result Date: 06/23/2016 CLINICAL DATA:  FALL Tuesday WITH BRUISING TO MAJORITY OF FACERIGHT LEG INJURYPMH: HTN, BREAST CA AND H/O mastectomy EXAM: CT HEAD WITHOUT CONTRAST CT MAXILLOFACIAL WITHOUT CONTRAST CT CERVICAL SPINE WITHOUT CONTRAST TECHNIQUE: Multidetector CT imaging of the head, cervical spine, and maxillofacial structures were performed using the standard protocol without intravenous contrast. Multiplanar CT image reconstructions of the cervical spine and maxillofacial structures were also generated. COMPARISON:  06/14/2014 FINDINGS: CT HEAD FINDINGS Brain: No evidence of acute infarction, hemorrhage, hydrocephalus, extra-axial collection or mass lesion/mass effect. There is ventricular and sulcal enlargement reflecting age related volume loss. Patchy white matter hypoattenuation is noted consistent with moderate chronic microvascular ischemic change. Vascular: No hyperdense vessel or unexpected calcification. Skull: Normal. Negative for fracture or focal lesion. Other: Frontal scalp hematoma. CT MAXILLOFACIAL FINDINGS Osseous: No fracture.  No bone lesion. Orbits: Status post bilateral cataract surgery. Globes and orbits are otherwise unremarkable. Sinuses: Clear sinuses, middle ear cavities  and mastoid air cells. Soft tissues: Mild left infraorbital/ cheek subcutaneous soft tissue edema. No formed hematoma. No soft tissue masses or adenopathy. CT CERVICAL SPINE FINDINGS Alignment: Straightening of the normal cervical lordosis. No spondylolisthesis. Skull base and vertebrae: No acute fracture. No primary bone lesion or focal pathologic process. Soft tissues and spinal canal: No prevertebral fluid or swelling. No visible canal hematoma. No left submandibular gland seen, presumably this has been surgically removed. No neck masses or adenopathy. Disc levels: Disc degenerative changes are noted throughout the cervical spine. There is moderate-to-marked loss of disc height, greatest at C5-C6 and C6-C7. There is mild spondylotic disc bulging with endplate spurring. Facet degenerative changes greatest on the left at C3-C4 through C5-C6. No convincing disc herniation. Upper chest: Unremarkable. Other: None. IMPRESSION: HEAD CT: No acute intracranial abnormalities. No skull fracture. Right midline frontal scalp hematoma. MAXILLOFACIAL CT:  No fractures. CERVICAL CT:  No fracture or acute finding. Electronically Signed   By: Lajean Manes M.D.   On: 06/23/2016 15:09    Procedures Procedures (including critical care time)  Medications Ordered in ED Medications  vancomycin (VANCOCIN) IVPB 1000 mg/200 mL premix (0 mg Intravenous Stopped 06/23/16 1720)  diphenhydrAMINE (BENADRYL) injection 25 mg (25 mg Intravenous Given 06/23/16 1724)     Initial Impression / Assessment and Plan / ED Course  I have reviewed the triage vital signs and the nursing notes.  Pertinent labs & imaging results that were available during my care of the patient were reviewed by me and considered in my medical decision making (see chart for details).  Clinical Course    83 year old female presents with cellulitis of her right leg and bruising of face after an injury. She is markedly hypertensive here in ED. No suspicion of  hypertensive emergency. CT head, neck, face are negative. Xray right knee and ankle remarkable for swelling but no fracture. Low suspicion for DVT clinically. CBC remarkable for worsening anemia (was 11.6 a year ago and 9.2 today). UA unremarkable. Will give dose of Vancomycin here in ED and will send home with Doxy. Strongly encouraged PCP follow up this week for wound check and to follow up on anemia and uncontrolled hypertension.  Final Clinical Impressions(s) / ED Diagnoses   Final diagnoses:  Cellulitis of right lower extremity  Traumatic ecchymosis of face, initial encounter    New Prescriptions New Prescriptions   No medications on file     Recardo Evangelist, PA-C 06/25/16 [place], MD 06/26/16 [phone]

## 2016-06-25 DIAGNOSIS — D638 Anemia in other chronic diseases classified elsewhere: Secondary | ICD-10-CM | POA: Diagnosis not present

## 2016-06-25 DIAGNOSIS — M25561 Pain in right knee: Secondary | ICD-10-CM | POA: Diagnosis not present

## 2016-06-25 DIAGNOSIS — M7989 Other specified soft tissue disorders: Secondary | ICD-10-CM | POA: Diagnosis not present

## 2016-06-28 ENCOUNTER — Ambulatory Visit (HOSPITAL_COMMUNITY)
Admission: RE | Admit: 2016-06-28 | Discharge: 2016-06-28 | Disposition: A | Discharge status: Home / Self Care | Payer: Medicare Other | Source: Ambulatory Visit | Attending: Vascular Surgery | Admitting: Vascular Surgery

## 2016-06-28 ENCOUNTER — Other Ambulatory Visit (HOSPITAL_COMMUNITY): Payer: Self-pay | Admitting: Internal Medicine

## 2016-06-28 DIAGNOSIS — M7989 Other specified soft tissue disorders: Principal | ICD-10-CM

## 2016-06-28 DIAGNOSIS — M79606 Pain in leg, unspecified: Secondary | ICD-10-CM

## 2016-06-28 DIAGNOSIS — M25561 Pain in right knee: Secondary | ICD-10-CM | POA: Insufficient documentation

## 2016-06-28 DIAGNOSIS — S8001XA Contusion of right knee, initial encounter: Secondary | ICD-10-CM | POA: Diagnosis not present

## 2016-10-03 DIAGNOSIS — I10 Essential (primary) hypertension: Secondary | ICD-10-CM | POA: Diagnosis not present

## 2016-10-03 DIAGNOSIS — R8290 Unspecified abnormal findings in urine: Secondary | ICD-10-CM | POA: Diagnosis not present

## 2016-10-03 DIAGNOSIS — E784 Other hyperlipidemia: Secondary | ICD-10-CM | POA: Diagnosis not present

## 2016-10-03 DIAGNOSIS — E038 Other specified hypothyroidism: Secondary | ICD-10-CM | POA: Diagnosis not present

## 2016-10-10 DIAGNOSIS — E784 Other hyperlipidemia: Secondary | ICD-10-CM | POA: Diagnosis not present

## 2016-10-10 DIAGNOSIS — E038 Other specified hypothyroidism: Secondary | ICD-10-CM | POA: Diagnosis not present

## 2016-10-10 DIAGNOSIS — Z89512 Acquired absence of left leg below knee: Secondary | ICD-10-CM | POA: Diagnosis not present

## 2016-10-10 DIAGNOSIS — K5909 Other constipation: Secondary | ICD-10-CM | POA: Diagnosis not present

## 2016-10-10 DIAGNOSIS — M5106 Intervertebral disc disorders with myelopathy, lumbar region: Secondary | ICD-10-CM | POA: Diagnosis not present

## 2016-10-10 DIAGNOSIS — C50919 Malignant neoplasm of unspecified site of unspecified female breast: Secondary | ICD-10-CM | POA: Diagnosis not present

## 2016-10-10 DIAGNOSIS — M1990 Unspecified osteoarthritis, unspecified site: Secondary | ICD-10-CM | POA: Diagnosis not present

## 2016-10-10 DIAGNOSIS — R51 Headache: Secondary | ICD-10-CM | POA: Diagnosis not present

## 2016-10-10 DIAGNOSIS — I10 Essential (primary) hypertension: Secondary | ICD-10-CM | POA: Diagnosis not present

## 2016-10-10 DIAGNOSIS — D638 Anemia in other chronic diseases classified elsewhere: Secondary | ICD-10-CM | POA: Diagnosis not present

## 2016-10-10 DIAGNOSIS — Z0000 Encounter for general adult medical examination without abnormal findings: Secondary | ICD-10-CM | POA: Diagnosis not present

## 2016-10-10 DIAGNOSIS — Z6828 Body mass index (BMI) 28.0-28.9, adult: Secondary | ICD-10-CM | POA: Diagnosis not present

## 2016-11-14 DIAGNOSIS — R8290 Unspecified abnormal findings in urine: Secondary | ICD-10-CM | POA: Diagnosis not present

## 2016-11-14 DIAGNOSIS — R300 Dysuria: Secondary | ICD-10-CM | POA: Diagnosis not present

## 2017-03-12 DIAGNOSIS — Z022 Encounter for examination for admission to residential institution: Secondary | ICD-10-CM | POA: Diagnosis not present

## 2017-03-12 DIAGNOSIS — R600 Localized edema: Secondary | ICD-10-CM | POA: Diagnosis not present

## 2017-03-12 DIAGNOSIS — Z6826 Body mass index (BMI) 26.0-26.9, adult: Secondary | ICD-10-CM | POA: Diagnosis not present

## 2017-04-16 DIAGNOSIS — K5909 Other constipation: Secondary | ICD-10-CM | POA: Diagnosis not present

## 2017-04-16 DIAGNOSIS — Z6824 Body mass index (BMI) 24.0-24.9, adult: Secondary | ICD-10-CM | POA: Diagnosis not present

## 2017-04-16 DIAGNOSIS — M5106 Intervertebral disc disorders with myelopathy, lumbar region: Secondary | ICD-10-CM | POA: Diagnosis not present

## 2017-04-16 DIAGNOSIS — Z79899 Other long term (current) drug therapy: Secondary | ICD-10-CM | POA: Diagnosis not present

## 2017-04-16 DIAGNOSIS — N183 Chronic kidney disease, stage 3 (moderate): Secondary | ICD-10-CM | POA: Diagnosis not present

## 2017-04-16 DIAGNOSIS — E7849 Other hyperlipidemia: Secondary | ICD-10-CM | POA: Diagnosis not present

## 2017-04-16 DIAGNOSIS — R269 Unspecified abnormalities of gait and mobility: Secondary | ICD-10-CM | POA: Diagnosis not present

## 2017-04-16 DIAGNOSIS — D638 Anemia in other chronic diseases classified elsewhere: Secondary | ICD-10-CM | POA: Diagnosis not present

## 2017-04-16 DIAGNOSIS — G547 Phantom limb syndrome without pain: Secondary | ICD-10-CM | POA: Diagnosis not present

## 2017-04-16 DIAGNOSIS — I10 Essential (primary) hypertension: Secondary | ICD-10-CM | POA: Diagnosis not present

## 2017-04-16 DIAGNOSIS — I129 Hypertensive chronic kidney disease with stage 1 through stage 4 chronic kidney disease, or unspecified chronic kidney disease: Secondary | ICD-10-CM | POA: Diagnosis not present

## 2017-04-16 DIAGNOSIS — Z89512 Acquired absence of left leg below knee: Secondary | ICD-10-CM | POA: Diagnosis not present

## 2017-05-03 ENCOUNTER — Other Ambulatory Visit: Payer: Self-pay

## 2017-05-03 ENCOUNTER — Emergency Department (HOSPITAL_COMMUNITY): Payer: Medicare Other

## 2017-05-03 ENCOUNTER — Emergency Department (HOSPITAL_COMMUNITY)
Admission: EM | Admit: 2017-05-03 | Discharge: 2017-05-03 | Disposition: A | Discharge status: Home / Self Care | Payer: Medicare Other | Attending: Emergency Medicine | Admitting: Emergency Medicine

## 2017-05-03 ENCOUNTER — Encounter (HOSPITAL_COMMUNITY): Payer: Self-pay | Admitting: Emergency Medicine

## 2017-05-03 DIAGNOSIS — R4701 Aphasia: Secondary | ICD-10-CM | POA: Insufficient documentation

## 2017-05-03 DIAGNOSIS — R9431 Abnormal electrocardiogram [ECG] [EKG]: Secondary | ICD-10-CM | POA: Diagnosis not present

## 2017-05-03 DIAGNOSIS — R0600 Dyspnea, unspecified: Secondary | ICD-10-CM | POA: Diagnosis not present

## 2017-05-03 DIAGNOSIS — R0603 Acute respiratory distress: Secondary | ICD-10-CM | POA: Diagnosis not present

## 2017-05-03 DIAGNOSIS — I10 Essential (primary) hypertension: Secondary | ICD-10-CM | POA: Diagnosis present

## 2017-05-03 DIAGNOSIS — I129 Hypertensive chronic kidney disease with stage 1 through stage 4 chronic kidney disease, or unspecified chronic kidney disease: Secondary | ICD-10-CM | POA: Insufficient documentation

## 2017-05-03 DIAGNOSIS — E860 Dehydration: Secondary | ICD-10-CM | POA: Diagnosis not present

## 2017-05-03 DIAGNOSIS — R531 Weakness: Secondary | ICD-10-CM | POA: Diagnosis not present

## 2017-05-03 DIAGNOSIS — Z79899 Other long term (current) drug therapy: Secondary | ICD-10-CM | POA: Insufficient documentation

## 2017-05-03 DIAGNOSIS — Z853 Personal history of malignant neoplasm of breast: Secondary | ICD-10-CM | POA: Insufficient documentation

## 2017-05-03 DIAGNOSIS — N189 Chronic kidney disease, unspecified: Secondary | ICD-10-CM | POA: Insufficient documentation

## 2017-05-03 DIAGNOSIS — I6789 Other cerebrovascular disease: Secondary | ICD-10-CM | POA: Diagnosis not present

## 2017-05-03 DIAGNOSIS — N3000 Acute cystitis without hematuria: Secondary | ICD-10-CM | POA: Diagnosis not present

## 2017-05-03 DIAGNOSIS — R29810 Facial weakness: Secondary | ICD-10-CM | POA: Diagnosis not present

## 2017-05-03 DIAGNOSIS — R4781 Slurred speech: Secondary | ICD-10-CM | POA: Insufficient documentation

## 2017-05-03 DIAGNOSIS — H579 Unspecified disorder of eye and adnexa: Secondary | ICD-10-CM | POA: Insufficient documentation

## 2017-05-03 DIAGNOSIS — R4182 Altered mental status, unspecified: Secondary | ICD-10-CM | POA: Diagnosis present

## 2017-05-03 DIAGNOSIS — N289 Disorder of kidney and ureter, unspecified: Secondary | ICD-10-CM

## 2017-05-03 LAB — COMPREHENSIVE METABOLIC PANEL
ALK PHOS: 68 U/L (ref 38–126)
ALT: 16 U/L (ref 14–54)
AST: 25 U/L (ref 15–41)
Albumin: 3.7 g/dL (ref 3.5–5.0)
Anion gap: 8 (ref 5–15)
BUN: 27 mg/dL — ABNORMAL HIGH (ref 6–20)
CALCIUM: 8.9 mg/dL (ref 8.9–10.3)
CHLORIDE: 106 mmol/L (ref 101–111)
CO2: 25 mmol/L (ref 22–32)
CREATININE: 1.36 mg/dL — AB (ref 0.44–1.00)
GFR, EST AFRICAN AMERICAN: 38 mL/min — AB (ref >60)
GFR, EST NON AFRICAN AMERICAN: 32 mL/min — AB (ref >60)
Glucose, Bld: 111 mg/dL — ABNORMAL HIGH (ref 65–99)
Potassium: 4.3 mmol/L (ref 3.5–5.1)
Sodium: 139 mmol/L (ref 135–145)
Total Bilirubin: 0.5 mg/dL (ref 0.3–1.2)
Total Protein: 6.3 g/dL — ABNORMAL LOW (ref 6.5–8.1)

## 2017-05-03 LAB — I-STAT CHEM 8, ED
BUN: 29 mg/dL — ABNORMAL HIGH (ref 6–20)
CALCIUM ION: 1.14 mmol/L — AB (ref 1.15–1.40)
Chloride: 105 mmol/L (ref 101–111)
Creatinine, Ser: 1.4 mg/dL — ABNORMAL HIGH (ref 0.44–1.00)
GLUCOSE: 108 mg/dL — AB (ref 65–99)
HCT: 34 % — ABNORMAL LOW (ref 36.0–46.0)
Hemoglobin: 11.6 g/dL — ABNORMAL LOW (ref 12.0–15.0)
Potassium: 4.3 mmol/L (ref 3.5–5.1)
Sodium: 142 mmol/L (ref 135–145)
TCO2: 27 mmol/L (ref 22–32)

## 2017-05-03 LAB — DIFFERENTIAL
Basophils Absolute: 0 10*3/uL (ref 0.0–0.1)
Basophils Relative: 0 %
EOS ABS: 0.3 10*3/uL (ref 0.0–0.7)
EOS PCT: 5 %
LYMPHS ABS: 1.9 10*3/uL (ref 0.7–4.0)
LYMPHS PCT: 33 %
MONO ABS: 0.4 10*3/uL (ref 0.1–1.0)
Monocytes Relative: 7 %
NEUTROS PCT: 55 %
Neutro Abs: 3.2 10*3/uL (ref 1.7–7.7)

## 2017-05-03 LAB — URINALYSIS, ROUTINE W REFLEX MICROSCOPIC
BILIRUBIN URINE: NEGATIVE
Glucose, UA: NEGATIVE mg/dL
KETONES UR: NEGATIVE mg/dL
Nitrite: NEGATIVE
PH: 8 (ref 5.0–8.0)
PROTEIN: NEGATIVE mg/dL
Specific Gravity, Urine: 1.006 (ref 1.005–1.030)

## 2017-05-03 LAB — CBC
HEMATOCRIT: 35.8 % — AB (ref 36.0–46.0)
HEMOGLOBIN: 11.7 g/dL — AB (ref 12.0–15.0)
MCH: 29.5 pg (ref 26.0–34.0)
MCHC: 32.7 g/dL (ref 30.0–36.0)
MCV: 90.4 fL (ref 78.0–100.0)
Platelets: 129 10*3/uL — ABNORMAL LOW (ref 150–400)
RBC: 3.96 MIL/uL (ref 3.87–5.11)
RDW: 13.6 % (ref 11.5–15.5)
WBC: 5.8 10*3/uL (ref 4.0–10.5)

## 2017-05-03 LAB — PROTIME-INR
INR: 1.06
Prothrombin Time: 13.7 seconds (ref 11.4–15.2)

## 2017-05-03 LAB — APTT: aPTT: 31 seconds (ref 24–36)

## 2017-05-03 LAB — CBG MONITORING, ED: Glucose-Capillary: 95 mg/dL (ref 65–99)

## 2017-05-03 LAB — I-STAT TROPONIN, ED: TROPONIN I, POC: 0 ng/mL (ref 0.00–0.08)

## 2017-05-03 MED ORDER — CEPHALEXIN 500 MG PO CAPS: 500.0000 mg | ORAL_CAPSULE | Freq: Four times a day (QID) | ORAL | 0 refills | Status: AC

## 2017-05-03 MED ORDER — LACTATED RINGERS IV BOLUS (SEPSIS)
1000.0000 mL | Freq: Once | INTRAVENOUS | Status: AC
  Administered 2017-05-03: 1000 mL via INTRAVENOUS

## 2017-05-03 MED ORDER — CEPHALEXIN 250 MG PO CAPS
500.0000 mg | ORAL_CAPSULE | Freq: Once | ORAL | Status: AC
  Administered 2017-05-03: 500 mg via ORAL
  Filled 2017-05-03: qty 2

## 2017-05-03 MED ORDER — HYDRALAZINE HCL 20 MG/ML IJ SOLN
5.0000 mg | Freq: Once | INTRAMUSCULAR | Status: AC
  Administered 2017-05-03: 5 mg via INTRAVENOUS
  Filled 2017-05-03: qty 1

## 2017-05-03 NOTE — Discharge Instructions (Signed)
Please follow up with your regular doctor in 2-3 day for re-evaluation of your blood pressure and hydration status. Please return to the ED with worsening confusion/mental status changes, fever, abdominal pain, vomiting or other concerning symptoms.

## 2017-05-03 NOTE — Consult Note (Signed)
Requesting Physician: Dr. Reather Converse    Chief Complaint:  Respiratory distress  History obtained from:  Patient and  Chart    HPI:                                                                                                                                       Alexandra Alexander is an 83 y.o. female with PMH of cancer, HTN, left BKA  who lives in [place]. EMS was called for shortness of breath. She was noted to have left eyelid closed and maybe drift of left arm.  On arrival, ER physician not patient was not answering his questions and so patient was stroke alerted. On assessment, patient was speaking however not aware of month or her age. No focal deficits noted on examination.    Date last known well: 11.24.18 Time last known well: 6.30 pm tPA Given: no, symptoms resolved NIHSS: 2 Baseline MRS 2     Past Medical History:  Diagnosis Date  . Cancer [facility]) breast  . H/O mastectomy right  . Hypertension     Past Surgical History:  Procedure Laterality Date  . ABDOMINAL HYSTERECTOMY    . BREAST SURGERY    . LEG AMPUTATION      History reviewed. No pertinent family history. Social History:  reports that  has never smoked. she has never used smokeless tobacco. She reports that she does not drink alcohol or use drugs.  Allergies:  Allergies  Allergen Reactions  . Codeine Other (See Comments)    Unknown     Medications:                                                                                                                        I reviewed home medications.   ROS:  14 systems reviewed and negative except above.    Examination:                                                                                                      General: Appears well-developed and well-nourished.  Psych: Affect appropriate to situation Eyes: No scleral  injection HENT: No OP obstrucion Head: Normocephalic.  Cardiovascular: Normal rate and regular rhythm.  Respiratory: Effort normal and breath sounds normal to anterior ascultation GI: Soft.  No distension. There is no tenderness.  Skin: WDI   Neurological Examination Mental Status: Alert, oriented to herself, but no place, month or her age.  Speech fluent without evidence of aphasia.  Able to follow 3 step commands without difficulty. Cranial Nerves: II: ; Visual fields grossly normal,  III,IV, VI: ptosis not present, extra-ocular motions intact bilaterally, pupils equal, round, reactive to light and accommodation V,VII: smile symmetric, facial light touch sensation normal bilaterally VIII: hearing normal bilaterally IX,X: uvula rises symmetrically XI: bilateral shoulder shrug XII: midline tongue extension Motor: Right : Upper extremity   5/5    Left:     Upper extremity    Lower extremity   5/5     Lower extremity  amputated Tone and bulk:normal tone throughout; no atrophy noted Sensory: Pinprick and light touch intact throughout, bilaterally Deep Tendon Reflexes: 2+ and symmetric throughout Plantars: Right: downgoing   Left: downgoing Cerebellar: normal finger-to-nose, normal rapid alternating movements and normal heel-to-shin test Gait: normal gait and station     Lab Results: Basic Metabolic Panel: Recent Labs  Lab 05/03/17 1855 05/03/17 1859  NA 139 142  K 4.3 4.3  CL 106 105  CO2 25  --   GLUCOSE 111* 108*  BUN 27* 29*  CREATININE 1.36* 1.40*  CALCIUM 8.9  --     CBC: Recent Labs  Lab 05/03/17 1855 05/03/17 1859  WBC 5.8  --   NEUTROABS 3.2  --   HGB 11.7* 11.6*  HCT 35.8* 34.0*  MCV 90.4  --   PLT 129*  --     Coagulation Studies: Recent Labs    05/03/17 1855  LABPROT 13.7  INR 1.06    Imaging: Ct Head Code Stroke Wo Contrast  Result Date: 05/03/2017 CLINICAL DATA:  Code stroke. 83 y/o F; left facial droop and of left-sided arm  weakness. EXAM: CT HEAD WITHOUT CONTRAST TECHNIQUE: Contiguous axial images were obtained from the base of the skull through the vertex without intravenous contrast. COMPARISON:  06/23/2016 CT of the head FINDINGS: Brain: No evidence of acute infarction, hemorrhage, hydrocephalus, extra-axial collection or mass lesion/mass effect. Stable microvascular ischemic changes and parenchymal volume loss of the brain. Vascular: Calcific atherosclerosis of carotid siphons. No hyperdense vessel identified. Skull: Normal. Negative for fracture or focal lesion. Sinuses/Orbits: No acute finding. Other: Bilateral intra-ocular lens replacement. Minimal scalp soft tissue thickening in the right anterior frontal region from prior contusion. ASPECTS [facility] Stroke Program Early CT Score) - Ganglionic level infarction (caudate, lentiform nuclei, internal capsule, insula, M1-M3 cortex): 7 - Supraganglionic infarction (M4-M6 cortex): 3 Total score (0-10  with 10 being normal): 10 IMPRESSION: 1. No acute intracranial abnormality identified. 2. Stable chronic microvascular ischemic changes and parenchymal volume loss of the brain. 3. ASPECTS is 10 These results were sent by text page at the time of interpretation on 05/03/2017 at 7:13 pm to Dr. Lorraine Lax. Electronically Signed   By: Kristine Garbe M.D.   On: 05/03/2017 19:14     ASSESSMENT AND PLAN   83 y.o. female with PMH of cancer, HTN, left BKA  who lives in [place] presents with respiratory distress. Clarified Left eye lid was closed ( not facial droop as mentioned in Er encounter)  not stroke symptom. Left Arm drift not noted by Er physician or myself. Currently at her baseline. Low suspicion this was TIA as her presenting complaint was respiratory distress and now complains of abdominal pain and hip pain.  Will cancel stroke alert.     Sushanth Aroor [facility] Pager Number [phone]

## 2017-05-03 NOTE — ED Provider Notes (Signed)
[facility] EMERGENCY DEPARTMENT Provider Note   CSN: [id number] Arrival date & time: 05/03/17  1696     History   Chief Complaint Chief Complaint  Patient presents with  . Code Stroke    HPI Alexandra Alexander is a 83 y.o. female.  HPI 83 year old female with a history of hypertension and breast cancer presenting with altered mental status and a aphasia.  Per EMS they were called out for altered mental status and difficulty breathing and when they arrived the patient was alert and oriented and speaking without difficulty.  In route they noted that she was having more difficulty opening her left eye and they felt she had weakness in her left arm and the patient started slurring her speech and having difficulty talking.  On arrival she is trying to mouth words but unable to speak.  There is no history given of fevers.  She shakes her head no when she is asked if she has any headache, chest pain, abdominal pain.  No recent trauma.  Is not on anticoagulation.  Past Medical History:  Diagnosis Date  . Cancer [facility]) breast  . H/O mastectomy right  . Hypertension     Patient Active Problem List   Diagnosis Date Noted  . Hypertension 05/03/2017  . Mild renal insufficiency 05/03/2017    Past Surgical History:  Procedure Laterality Date  . ABDOMINAL HYSTERECTOMY    . BREAST SURGERY    . LEG AMPUTATION      OB History    No data available       Home Medications    Prior to Admission medications   Medication Sig Start Date End Date Taking? Authorizing Provider  Allantoin-Pramoxine (NEOSPORIN LT EX) Apply 1 application topically See admin instructions. 1 application to THE LEFT LEG to sore until healed   Yes [provider]  escitalopram (LEXAPRO) 10 MG tablet Take 10 mg by mouth daily.    Yes [provider]  fentaNYL (DURAGESIC - DOSED MCG/HR) 50 MCG/HR Place 50 mcg onto the skin every 3 (three) days. REMOVE OLD ONE PRIOR TO APPLYING NEW  ONE   Yes [provider]  HYDROcodone-acetaminophen (NORCO/VICODIN) 5-325 MG tablet Take 1 tablet by mouth every morning.    Yes [provider]  levothyroxine (SYNTHROID, LEVOTHROID) 75 MCG tablet Take 75 mcg by mouth daily before breakfast.   Yes [provider]  losartan (COZAAR) 100 MG tablet Take 100 mg by mouth daily.   Yes [provider]  lubiprostone (AMITIZA) 8 MCG capsule Take 8 mcg by mouth daily.   Yes [provider]  Lutein-Zeaxanthin 20-1 MG CAPS Take 1 capsule by mouth daily.   Yes [provider]  Multiple Vitamins-Minerals (CENTRUM SILVER 50+WOMEN) TABS Take 1 tablet by mouth daily.   Yes [provider]  nebivolol (BYSTOLIC) 10 MG tablet Take 10 mg by mouth daily.   Yes [provider]  Omega-3 Fatty Acids (FISH OIL) 1000 MG CAPS Take 1,000 mg by mouth every evening.    Yes [provider]  cephALEXin (KEFLEX) 500 MG capsule Take 1 capsule (500 mg total) by mouth 4 (four) times daily for 5 days. 05/03/17 05/08/17  Myrah Strawderman Mali, MD  doxycycline (VIBRAMYCIN) 100 MG capsule Take 1 capsule (100 mg total) by mouth 2 (two) times daily. Patient not taking: Reported on 05/03/2017 06/23/16   Recardo Evangelist, PA-C    Family History History reviewed. No pertinent family history.  Social History Social History  Tobacco Use  . Smoking status: Never Smoker  . Smokeless tobacco: Never Used  Substance Use Topics  . Alcohol use: No  . Drug use: No     Allergies   Codeine and Vancomycin   Review of Systems Review of Systems  Constitutional: Negative for chills and fever.  HENT: Negative for ear pain and sore throat.   Eyes: Negative for pain and visual disturbance.  Respiratory: Negative for cough and shortness of breath.   Cardiovascular: Negative for chest pain and palpitations.  Gastrointestinal: Negative for abdominal pain and vomiting.  Genitourinary: Negative for dysuria and  hematuria.  Musculoskeletal: Negative for arthralgias and back pain.  Skin: Negative for color change and rash.  Neurological: Positive for speech difficulty. Negative for seizures and syncope.  Psychiatric/Behavioral: Positive for confusion.  All other systems reviewed and are negative.    Physical Exam Updated Vital Signs BP (!) 187/95   Pulse 75   Temp 98 F (36.7 C) (Oral)   Resp (!) 22   Ht 5\' 3"  (1.6 m)   Wt 57.8 kg (127 lb 6.8 oz)   SpO2 98%   BMI 22.57 kg/m   Physical Exam  Constitutional: She appears well-developed and well-nourished. She appears cachectic. No distress.  HENT:  Head: Normocephalic and atraumatic.  Mouth/Throat: Mucous membranes are dry.  Eyes: Conjunctivae and EOM are normal. Pupils are equal, round, and reactive to light.  Neck: Normal range of motion. Neck supple. No neck rigidity.  Cardiovascular: Normal rate, regular rhythm, S1 normal, S2 normal, normal heart sounds, intact distal pulses and normal pulses. Exam reveals no gallop and no friction rub.  No murmur heard. Pulmonary/Chest: Effort normal and breath sounds normal. No accessory muscle usage. No tachypnea. No respiratory distress. She has no decreased breath sounds. She has no wheezes. She has no rhonchi.  Abdominal: Soft. There is no tenderness. There is no guarding.  Musculoskeletal: She exhibits no edema.  Neurological: She is alert.  Pt unable to speak and acts like she is trying to find words. No tongue deviation. No facial droop. 5/5 strength with flexion and extension of all for extremities at major joints. Sensation intact. No pronator drift.  Skin: Skin is warm and dry.  Psychiatric: She has a normal mood and affect.  Nursing note and vitals reviewed.    ED Treatments / Results  Labs (all labs ordered are listed, but only abnormal results are displayed) Labs Reviewed  CBC - Abnormal; Notable for the following components:      Result Value   Hemoglobin 11.7 (*)    HCT 35.8  (*)    Platelets 129 (*)    All other components within normal limits  COMPREHENSIVE METABOLIC PANEL - Abnormal; Notable for the following components:   Glucose, Bld 111 (*)    BUN 27 (*)    Creatinine, Ser 1.36 (*)    Total Protein 6.3 (*)    GFR calc non Af Amer 32 (*)    GFR calc Af Amer 38 (*)    All other components within normal limits  URINALYSIS, ROUTINE W REFLEX MICROSCOPIC - Abnormal; Notable for the following components:   Hgb urine dipstick SMALL (*)    Leukocytes, UA LARGE (*)    Bacteria, UA MANY (*)    Squamous Epithelial / LPF 0-5 (*)    All other components within normal limits  I-STAT CHEM 8, ED - Abnormal; Notable for the following components:   BUN 29 (*)    Creatinine, Ser  1.40 (*)    Glucose, Bld 108 (*)    Calcium, Ion 1.14 (*)    Hemoglobin 11.6 (*)    HCT 34.0 (*)    All other components within normal limits  URINE CULTURE  PROTIME-INR  APTT  DIFFERENTIAL  I-STAT TROPONIN, ED  CBG MONITORING, ED    EKG  EKG Interpretation None       Radiology Ct Head Code Stroke Wo Contrast  Result Date: 05/03/2017 CLINICAL DATA:  Code stroke. 83 y/o F; left facial droop and of left-sided arm weakness. EXAM: CT HEAD WITHOUT CONTRAST TECHNIQUE: Contiguous axial images were obtained from the base of the skull through the vertex without intravenous contrast. COMPARISON:  06/23/2016 CT of the head FINDINGS: Brain: No evidence of acute infarction, hemorrhage, hydrocephalus, extra-axial collection or mass lesion/mass effect. Stable microvascular ischemic changes and parenchymal volume loss of the brain. Vascular: Calcific atherosclerosis of carotid siphons. No hyperdense vessel identified. Skull: Normal. Negative for fracture or focal lesion. Sinuses/Orbits: No acute finding. Other: Bilateral intra-ocular lens replacement. Minimal scalp soft tissue thickening in the right anterior frontal region from prior contusion. ASPECTS [facility] Stroke Program Early CT Score) -  Ganglionic level infarction (caudate, lentiform nuclei, internal capsule, insula, M1-M3 cortex): 7 - Supraganglionic infarction (M4-M6 cortex): 3 Total score (0-10 with 10 being normal): 10 IMPRESSION: 1. No acute intracranial abnormality identified. 2. Stable chronic microvascular ischemic changes and parenchymal volume loss of the brain. 3. ASPECTS is 10 These results were sent by text Jonessa Triplett at the time of interpretation on 05/03/2017 at 7:13 pm to Dr. Lorraine Lax. Electronically Signed   By: Kristine Garbe M.D.   On: 05/03/2017 19:14    Procedures Procedures (including critical care time)  Medications Ordered in ED Medications  lactated ringers bolus 1,000 mL (0 mLs Intravenous Stopped 05/03/17 2303)  hydrALAZINE (APRESOLINE) injection 5 mg (5 mg Intravenous Given 05/03/17 2013)  cephALEXin (KEFLEX) capsule 500 mg (500 mg Oral Given 05/03/17 2303)     Initial Impression / Assessment and Plan / ED Course  I have reviewed the triage vital signs and the nursing notes.  Pertinent labs & imaging results that were available during my care of the patient were reviewed by me and considered in my medical decision making (see chart for details).     83 year old female presenting with a aphasia and altered mental status.  She is afebrile and hemodynamic was stable on arrival.  Patient had gradually worsening a aphasia with EMS and is unable to speak and appears as if she is trying to find her words.  Otherwise neuro exam normal.  Code stroke called for aphasia.   After the CT scan the neurologist was able to evaluate the patient and she was answering questions appropriately.  She is now no longer having difficulty speaking.  On his exam and history gathering from the family and patient he does not believe that this is an acute stroke or TIA.  Patient appears dehydrated on exam is hypertensive to the 210s.  No acute abnormality on head CT.  EKG was sinus bradycardia with no ischemic changes or signs  of dysrhythmia. Labs notable for creatinine 1.4 which is elevated from her baseline of approximately 1 and a BUN of 29 consistent with mild dehydration. Will give LR bolus for dehydration.  Hydralazine given for high blood pressure.  Beta-blockers avoided as the patient has sinus bradycardia in the high 50s.  Once the family arrived they were able to collaborate that the patient is mentally  at her baseline but has been intermittently confused recently.  He states she is also endorsed dysuria today.  Will order UA.  As she is elderly and has had intermittent episodes of altered mental status, patient discussed with the hospitalist for possible admission for dehydration.  The hospitalist will come evaluate the patient.  The hospitalist evaluate the patient and spoke with the family and after shared decision making the decision was made to discharge the patient to our facility and have her follow-up with her PCP the next available appointment.  Patient is tolerating p.o. fluids and her mental status has improved after the fluid bolus.  UA does show concern for infection.  We will give 1 dose of Keflex here and treat with Keflex outpatient. Family amenable with plan. Strict return precautions given.  Final Clinical Impressions(s) / ED Diagnoses   Final diagnoses:  Acute cystitis without hematuria  Dehydration    ED Discharge Orders        Ordered    cephALEXin (KEFLEX) 500 MG capsule  4 times daily     05/03/17 2243       Polette Nofsinger Mali, MD 05/03/17 [id number]    Elnora Morrison, MD 05/03/17 2328

## 2017-05-03 NOTE — ED Triage Notes (Signed)
Per EMS:  EMS called out to facility for SOB.  Pt c/o abdominal pain upon arrival.  En route patient begins to display left sided facial droop with left arm weakness as well as slurred speech.  Pt not verbally responding to EDP at bridge, weakness not seen by EDP.  Pt holding left eye closed, but follows commands and able to open when asked to assist.

## 2017-05-06 LAB — URINE CULTURE: Culture: >=100000 — AB

## 2017-05-07 ENCOUNTER — Telehealth: Payer: Self-pay | Admitting: Emergency Medicine

## 2017-05-07 DIAGNOSIS — N390 Urinary tract infection, site not specified: Secondary | ICD-10-CM | POA: Diagnosis not present

## 2017-05-07 DIAGNOSIS — I129 Hypertensive chronic kidney disease with stage 1 through stage 4 chronic kidney disease, or unspecified chronic kidney disease: Secondary | ICD-10-CM | POA: Diagnosis not present

## 2017-05-07 DIAGNOSIS — Z6823 Body mass index (BMI) 23.0-23.9, adult: Secondary | ICD-10-CM | POA: Diagnosis not present

## 2017-05-07 NOTE — Telephone Encounter (Signed)
Post ED Visit - Positive Culture Follow-up  Culture report reviewed by antimicrobial stewardship pharmacist:  []  Elenor Quinones, Pharm.D. []  Heide Guile, Pharm.D., BCPS AQ-ID []  Parks Neptune, Pharm.D., BCPS []  Alycia Rossetti, Pharm.D., BCPS []  [place], Pharm.D., BCPS, AAHIVP []  Legrand Como, Pharm.D., BCPS, AAHIVP []  Salome Arnt, PharmD, BCPS []  Dimitri Ped, PharmD, BCPS []  Vincenza Hews, PharmD, BCPS Ulanda Edison PharmD  Positive urine culture Treated with cephalexin, organism sensitive to the same and no further patient follow-up is required at this time.  Hazle Nordmann 05/07/2017, 3:04 PM

## 2017-06-26 DIAGNOSIS — I10 Essential (primary) hypertension: Secondary | ICD-10-CM | POA: Diagnosis not present

## 2017-06-26 DIAGNOSIS — Z0289 Encounter for other administrative examinations: Secondary | ICD-10-CM | POA: Diagnosis not present

## 2017-06-26 DIAGNOSIS — Z6824 Body mass index (BMI) 24.0-24.9, adult: Secondary | ICD-10-CM | POA: Diagnosis not present

## 2017-07-13 ENCOUNTER — Other Ambulatory Visit: Payer: Self-pay

## 2017-07-13 ENCOUNTER — Encounter (HOSPITAL_COMMUNITY): Payer: Self-pay | Admitting: Certified Registered"

## 2017-07-13 ENCOUNTER — Emergency Department (HOSPITAL_COMMUNITY): Payer: Medicare Other

## 2017-07-13 ENCOUNTER — Encounter (HOSPITAL_COMMUNITY)
Admission: EM | Disposition: A | Discharge status: Skilled Nursing Facility | Payer: Self-pay | Source: Home / Self Care | Attending: Internal Medicine

## 2017-07-13 ENCOUNTER — Inpatient Hospital Stay (HOSPITAL_COMMUNITY): Payer: Medicare Other | Admitting: Certified Registered"

## 2017-07-13 ENCOUNTER — Inpatient Hospital Stay (HOSPITAL_COMMUNITY): Payer: Medicare Other

## 2017-07-13 ENCOUNTER — Inpatient Hospital Stay (HOSPITAL_COMMUNITY)
Admission: EM | Admit: 2017-07-13 | Discharge: 2017-07-19 | DRG: 854 | Disposition: A | Discharge status: Skilled Nursing Facility | Payer: Medicare Other | Attending: Internal Medicine | Admitting: Internal Medicine

## 2017-07-13 DIAGNOSIS — I248 Other forms of acute ischemic heart disease: Secondary | ICD-10-CM | POA: Diagnosis present

## 2017-07-13 DIAGNOSIS — A419 Sepsis, unspecified organism: Secondary | ICD-10-CM

## 2017-07-13 DIAGNOSIS — A4151 Sepsis due to Escherichia coli [E. coli]: Principal | ICD-10-CM | POA: Diagnosis present

## 2017-07-13 DIAGNOSIS — M6281 Muscle weakness (generalized): Secondary | ICD-10-CM | POA: Diagnosis not present

## 2017-07-13 DIAGNOSIS — N183 Chronic kidney disease, stage 3 unspecified: Secondary | ICD-10-CM | POA: Diagnosis present

## 2017-07-13 DIAGNOSIS — N136 Pyonephrosis: Secondary | ICD-10-CM | POA: Diagnosis present

## 2017-07-13 DIAGNOSIS — N179 Acute kidney failure, unspecified: Secondary | ICD-10-CM | POA: Diagnosis not present

## 2017-07-13 DIAGNOSIS — R6520 Severe sepsis without septic shock: Secondary | ICD-10-CM | POA: Diagnosis present

## 2017-07-13 DIAGNOSIS — R7989 Other specified abnormal findings of blood chemistry: Secondary | ICD-10-CM

## 2017-07-13 DIAGNOSIS — T447X5A Adverse effect of beta-adrenoreceptor antagonists, initial encounter: Secondary | ICD-10-CM | POA: Diagnosis present

## 2017-07-13 DIAGNOSIS — Z89512 Acquired absence of left leg below knee: Secondary | ICD-10-CM

## 2017-07-13 DIAGNOSIS — Z79890 Hormone replacement therapy (postmenopausal): Secondary | ICD-10-CM

## 2017-07-13 DIAGNOSIS — N2889 Other specified disorders of kidney and ureter: Secondary | ICD-10-CM | POA: Diagnosis not present

## 2017-07-13 DIAGNOSIS — N10 Acute tubulo-interstitial nephritis: Secondary | ICD-10-CM | POA: Diagnosis not present

## 2017-07-13 DIAGNOSIS — E872 Acidosis: Secondary | ICD-10-CM | POA: Diagnosis present

## 2017-07-13 DIAGNOSIS — E039 Hypothyroidism, unspecified: Secondary | ICD-10-CM | POA: Diagnosis present

## 2017-07-13 DIAGNOSIS — I129 Hypertensive chronic kidney disease with stage 1 through stage 4 chronic kidney disease, or unspecified chronic kidney disease: Secondary | ICD-10-CM | POA: Diagnosis present

## 2017-07-13 DIAGNOSIS — R278 Other lack of coordination: Secondary | ICD-10-CM | POA: Diagnosis not present

## 2017-07-13 DIAGNOSIS — N201 Calculus of ureter: Secondary | ICD-10-CM | POA: Diagnosis not present

## 2017-07-13 DIAGNOSIS — I361 Nonrheumatic tricuspid (valve) insufficiency: Secondary | ICD-10-CM | POA: Diagnosis not present

## 2017-07-13 DIAGNOSIS — K5900 Constipation, unspecified: Secondary | ICD-10-CM | POA: Diagnosis present

## 2017-07-13 DIAGNOSIS — Z79891 Long term (current) use of opiate analgesic: Secondary | ICD-10-CM

## 2017-07-13 DIAGNOSIS — R410 Disorientation, unspecified: Secondary | ICD-10-CM | POA: Diagnosis not present

## 2017-07-13 DIAGNOSIS — Z87440 Personal history of urinary (tract) infections: Secondary | ICD-10-CM | POA: Diagnosis not present

## 2017-07-13 DIAGNOSIS — R001 Bradycardia, unspecified: Secondary | ICD-10-CM | POA: Diagnosis present

## 2017-07-13 DIAGNOSIS — N132 Hydronephrosis with renal and ureteral calculous obstruction: Secondary | ICD-10-CM | POA: Diagnosis not present

## 2017-07-13 DIAGNOSIS — Z6823 Body mass index (BMI) 23.0-23.9, adult: Secondary | ICD-10-CM | POA: Diagnosis not present

## 2017-07-13 DIAGNOSIS — R402242 Coma scale, best verbal response, confused conversation, at arrival to emergency department: Secondary | ICD-10-CM | POA: Diagnosis present

## 2017-07-13 DIAGNOSIS — R7881 Bacteremia: Secondary | ICD-10-CM | POA: Diagnosis present

## 2017-07-13 DIAGNOSIS — R140 Abdominal distension (gaseous): Secondary | ICD-10-CM

## 2017-07-13 DIAGNOSIS — Z885 Allergy status to narcotic agent status: Secondary | ICD-10-CM

## 2017-07-13 DIAGNOSIS — R109 Unspecified abdominal pain: Secondary | ICD-10-CM

## 2017-07-13 DIAGNOSIS — Z79899 Other long term (current) drug therapy: Secondary | ICD-10-CM

## 2017-07-13 DIAGNOSIS — N390 Urinary tract infection, site not specified: Secondary | ICD-10-CM | POA: Diagnosis not present

## 2017-07-13 DIAGNOSIS — B9620 Unspecified Escherichia coli [E. coli] as the cause of diseases classified elsewhere: Secondary | ICD-10-CM | POA: Diagnosis present

## 2017-07-13 DIAGNOSIS — I4510 Unspecified right bundle-branch block: Secondary | ICD-10-CM | POA: Diagnosis present

## 2017-07-13 DIAGNOSIS — R64 Cachexia: Secondary | ICD-10-CM | POA: Diagnosis present

## 2017-07-13 DIAGNOSIS — R509 Fever, unspecified: Secondary | ICD-10-CM | POA: Diagnosis not present

## 2017-07-13 DIAGNOSIS — G894 Chronic pain syndrome: Secondary | ICD-10-CM | POA: Diagnosis present

## 2017-07-13 DIAGNOSIS — R402142 Coma scale, eyes open, spontaneous, at arrival to emergency department: Secondary | ICD-10-CM | POA: Diagnosis present

## 2017-07-13 DIAGNOSIS — R1012 Left upper quadrant pain: Secondary | ICD-10-CM

## 2017-07-13 DIAGNOSIS — R402352 Coma scale, best motor response, localizes pain, at arrival to emergency department: Secondary | ICD-10-CM | POA: Diagnosis present

## 2017-07-13 DIAGNOSIS — R112 Nausea with vomiting, unspecified: Secondary | ICD-10-CM | POA: Diagnosis not present

## 2017-07-13 DIAGNOSIS — Z66 Do not resuscitate: Secondary | ICD-10-CM | POA: Diagnosis present

## 2017-07-13 DIAGNOSIS — R41841 Cognitive communication deficit: Secondary | ICD-10-CM | POA: Diagnosis not present

## 2017-07-13 DIAGNOSIS — R4182 Altered mental status, unspecified: Secondary | ICD-10-CM | POA: Diagnosis not present

## 2017-07-13 DIAGNOSIS — R531 Weakness: Secondary | ICD-10-CM | POA: Diagnosis not present

## 2017-07-13 DIAGNOSIS — D631 Anemia in chronic kidney disease: Secondary | ICD-10-CM | POA: Diagnosis present

## 2017-07-13 DIAGNOSIS — I10 Essential (primary) hypertension: Secondary | ICD-10-CM | POA: Diagnosis present

## 2017-07-13 DIAGNOSIS — R2681 Unsteadiness on feet: Secondary | ICD-10-CM | POA: Diagnosis not present

## 2017-07-13 DIAGNOSIS — R778 Other specified abnormalities of plasma proteins: Secondary | ICD-10-CM | POA: Diagnosis present

## 2017-07-13 DIAGNOSIS — Z881 Allergy status to other antibiotic agents status: Secondary | ICD-10-CM

## 2017-07-13 DIAGNOSIS — R293 Abnormal posture: Secondary | ICD-10-CM | POA: Diagnosis not present

## 2017-07-13 DIAGNOSIS — R1311 Dysphagia, oral phase: Secondary | ICD-10-CM | POA: Diagnosis not present

## 2017-07-13 DIAGNOSIS — R748 Abnormal levels of other serum enzymes: Secondary | ICD-10-CM | POA: Diagnosis not present

## 2017-07-13 HISTORY — DX: Hypothyroidism, unspecified: E03.9

## 2017-07-13 HISTORY — PX: CYSTOSCOPY WITH STENT PLACEMENT: SHX5790

## 2017-07-13 LAB — COMPREHENSIVE METABOLIC PANEL
ALBUMIN: 3.7 g/dL (ref 3.5–5.0)
ALT: 57 U/L — ABNORMAL HIGH (ref 14–54)
ANION GAP: 18 — AB (ref 5–15)
AST: 92 U/L — ABNORMAL HIGH (ref 15–41)
Alkaline Phosphatase: 167 U/L — ABNORMAL HIGH (ref 38–126)
BILIRUBIN TOTAL: 0.8 mg/dL (ref 0.3–1.2)
BUN: 41 mg/dL — ABNORMAL HIGH (ref 6–20)
CO2: 18 mmol/L — ABNORMAL LOW (ref 22–32)
Calcium: 9.2 mg/dL (ref 8.9–10.3)
Chloride: 107 mmol/L (ref 101–111)
Creatinine, Ser: 2.39 mg/dL — ABNORMAL HIGH (ref 0.44–1.00)
GFR calc Af Amer: 19 mL/min — ABNORMAL LOW (ref >60)
GFR calc non Af Amer: 16 mL/min — ABNORMAL LOW (ref >60)
GLUCOSE: 176 mg/dL — AB (ref 65–99)
POTASSIUM: 3.6 mmol/L (ref 3.5–5.1)
Sodium: 143 mmol/L (ref 135–145)
TOTAL PROTEIN: 6.7 g/dL (ref 6.5–8.1)

## 2017-07-13 LAB — I-STAT CG4 LACTIC ACID, ED
LACTIC ACID, VENOUS: 7.83 mmol/L — AB (ref 0.5–1.9)
Lactic Acid, Venous: 9.48 mmol/L (ref 0.5–1.9)

## 2017-07-13 LAB — CBC WITH DIFFERENTIAL/PLATELET
BASOS ABS: 0 10*3/uL (ref 0.0–0.1)
Basophils Relative: 0 %
Eosinophils Absolute: 0 10*3/uL (ref 0.0–0.7)
Eosinophils Relative: 0 %
HEMATOCRIT: 34.9 % — AB (ref 36.0–46.0)
Hemoglobin: 12 g/dL (ref 12.0–15.0)
LYMPHS PCT: 4 %
Lymphs Abs: 0.4 10*3/uL — ABNORMAL LOW (ref 0.7–4.0)
MCH: 30.5 pg (ref 26.0–34.0)
MCHC: 34.4 g/dL (ref 30.0–36.0)
MCV: 88.6 fL (ref 78.0–100.0)
MONOS PCT: 1 %
Monocytes Absolute: 0.1 10*3/uL (ref 0.1–1.0)
Neutro Abs: 8.7 10*3/uL — ABNORMAL HIGH (ref 1.7–7.7)
Neutrophils Relative %: 95 %
Platelets: 107 10*3/uL — ABNORMAL LOW (ref 150–400)
RBC: 3.94 MIL/uL (ref 3.87–5.11)
RDW: 13.8 % (ref 11.5–15.5)
WBC: 9.2 10*3/uL (ref 4.0–10.5)

## 2017-07-13 LAB — URINALYSIS, ROUTINE W REFLEX MICROSCOPIC
Bilirubin Urine: NEGATIVE
Glucose, UA: NEGATIVE mg/dL
Ketones, ur: NEGATIVE mg/dL
Nitrite: POSITIVE — AB
Protein, ur: 30 mg/dL — AB
Specific Gravity, Urine: 1.012 (ref 1.005–1.030)
Squamous Epithelial / LPF: NONE SEEN
pH: 5 (ref 5.0–8.0)

## 2017-07-13 LAB — TSH: TSH: 2.012 u[IU]/mL (ref 0.350–4.500)

## 2017-07-13 LAB — LACTIC ACID, PLASMA
LACTIC ACID, VENOUS: 3.5 mmol/L — AB (ref 0.5–1.9)
Lactic Acid, Venous: 3.9 mmol/L (ref 0.5–1.9)

## 2017-07-13 LAB — INFLUENZA PANEL BY PCR (TYPE A & B)
INFLAPCR: NEGATIVE
INFLBPCR: NEGATIVE

## 2017-07-13 LAB — MRSA PCR SCREENING: MRSA by PCR: NEGATIVE

## 2017-07-13 LAB — TROPONIN I
TROPONIN I: 0.34 ng/mL — AB (ref <0.03)
Troponin I: 0.35 ng/mL (ref <0.03)

## 2017-07-13 SURGERY — CYSTOSCOPY, WITH STENT INSERTION
Anesthesia: Monitor Anesthesia Care | Site: Ureter | Laterality: Left

## 2017-07-13 MED ORDER — ASPIRIN 325 MG PO TABS
325.0000 mg | ORAL_TABLET | Freq: Every day | ORAL | Status: DC
  Administered 2017-07-13 – 2017-07-19 (×7): 325 mg via ORAL
  Filled 2017-07-13 (×8): qty 1

## 2017-07-13 MED ORDER — MEPERIDINE HCL 25 MG/ML IJ SOLN: 6.2500 mg | INTRAMUSCULAR | Status: DC | PRN

## 2017-07-13 MED ORDER — PROPOFOL 10 MG/ML IV BOLUS
INTRAVENOUS | Status: AC
  Filled 2017-07-13: qty 20

## 2017-07-13 MED ORDER — PROPOFOL 10 MG/ML IV BOLUS
INTRAVENOUS | Status: DC | PRN
  Administered 2017-07-13: 20 mg via INTRAVENOUS
  Administered 2017-07-13 (×3): 10 mg via INTRAVENOUS

## 2017-07-13 MED ORDER — PROMETHAZINE HCL 25 MG/ML IJ SOLN: 6.2500 mg | INTRAMUSCULAR | Status: DC | PRN

## 2017-07-13 MED ORDER — LACTATED RINGERS IV SOLN
INTRAVENOUS | Status: DC | PRN
  Administered 2017-07-13: 14:00:00 via INTRAVENOUS

## 2017-07-13 MED ORDER — PIPERACILLIN-TAZOBACTAM 3.375 G IVPB 30 MIN
3.3750 g | Freq: Once | INTRAVENOUS | Status: AC
  Administered 2017-07-13: 3.375 g via INTRAVENOUS
  Filled 2017-07-13: qty 50

## 2017-07-13 MED ORDER — NEBIVOLOL HCL 5 MG PO TABS
5.0000 mg | ORAL_TABLET | Freq: Every day | ORAL | Status: DC
  Administered 2017-07-14 – 2017-07-17 (×4): 5 mg via ORAL
  Filled 2017-07-13 (×5): qty 1

## 2017-07-13 MED ORDER — SODIUM CHLORIDE 0.9 % IV BOLUS (SEPSIS)
500.0000 mL | Freq: Once | INTRAVENOUS | Status: AC
  Administered 2017-07-13: 500 mL via INTRAVENOUS

## 2017-07-13 MED ORDER — LIDOCAINE HCL 2 % EX GEL
CUTANEOUS | Status: AC
  Filled 2017-07-13: qty 5

## 2017-07-13 MED ORDER — FENTANYL CITRATE (PF) 250 MCG/5ML IJ SOLN
INTRAMUSCULAR | Status: AC
  Filled 2017-07-13: qty 5

## 2017-07-13 MED ORDER — LUTEIN-ZEAXANTHIN 20-1 MG PO CAPS: 1.0000 | ORAL_CAPSULE | Freq: Every day | ORAL | Status: DC

## 2017-07-13 MED ORDER — LEVOTHYROXINE SODIUM 25 MCG PO TABS
75.0000 ug | ORAL_TABLET | Freq: Every day | ORAL | Status: DC
  Administered 2017-07-14 – 2017-07-19 (×6): 75 ug via ORAL
  Filled 2017-07-13 (×2): qty 1
  Filled 2017-07-13: qty 3
  Filled 2017-07-13 (×3): qty 1

## 2017-07-13 MED ORDER — PIPERACILLIN-TAZOBACTAM IN DEX 2-0.25 GM/50ML IV SOLN
2.2500 g | Freq: Three times a day (TID) | INTRAVENOUS | Status: DC
  Administered 2017-07-13 – 2017-07-14 (×2): 2.25 g via INTRAVENOUS
  Filled 2017-07-13 (×3): qty 50

## 2017-07-13 MED ORDER — LIDOCAINE 2% (20 MG/ML) 5 ML SYRINGE
INTRAMUSCULAR | Status: DC | PRN
  Administered 2017-07-13: 50 mg via INTRAVENOUS

## 2017-07-13 MED ORDER — SODIUM CHLORIDE 0.9 % IR SOLN
Status: DC | PRN
  Administered 2017-07-13: 3000 mL

## 2017-07-13 MED ORDER — ADULT MULTIVITAMIN W/MINERALS CH
1.0000 | ORAL_TABLET | Freq: Every day | ORAL | Status: DC
  Filled 2017-07-13: qty 1

## 2017-07-13 MED ORDER — LIDOCAINE HCL 2 % EX GEL
CUTANEOUS | Status: DC | PRN
  Administered 2017-07-13: 1 via URETHRAL

## 2017-07-13 MED ORDER — POLYETHYLENE GLYCOL 3350 17 G PO PACK: 17.0000 g | PACK | Freq: Every day | ORAL | Status: DC | PRN

## 2017-07-13 MED ORDER — LEVOFLOXACIN IN D5W 750 MG/150ML IV SOLN
750.0000 mg | Freq: Once | INTRAVENOUS | Status: AC
  Administered 2017-07-13: 750 mg via INTRAVENOUS
  Filled 2017-07-13: qty 150

## 2017-07-13 MED ORDER — EPHEDRINE 5 MG/ML INJ
INTRAVENOUS | Status: AC
  Filled 2017-07-13: qty 10

## 2017-07-13 MED ORDER — FENTANYL CITRATE (PF) 100 MCG/2ML IJ SOLN
INTRAMUSCULAR | Status: DC | PRN
  Administered 2017-07-13 (×2): 50 ug via INTRAVENOUS

## 2017-07-13 MED ORDER — FENTANYL 25 MCG/HR TD PT72
25.0000 ug | MEDICATED_PATCH | TRANSDERMAL | Status: DC
  Administered 2017-07-13 – 2017-07-16 (×2): 25 ug via TRANSDERMAL
  Filled 2017-07-13 (×2): qty 1

## 2017-07-13 MED ORDER — SODIUM CHLORIDE 0.9 % IV BOLUS (SEPSIS)
1000.0000 mL | Freq: Once | INTRAVENOUS | Status: AC
  Administered 2017-07-13: 1000 mL via INTRAVENOUS

## 2017-07-13 MED ORDER — ESCITALOPRAM OXALATE 10 MG PO TABS
10.0000 mg | ORAL_TABLET | Freq: Every day | ORAL | Status: DC
  Administered 2017-07-14 – 2017-07-19 (×6): 10 mg via ORAL
  Filled 2017-07-13 (×6): qty 1

## 2017-07-13 MED ORDER — EPHEDRINE SULFATE-NACL 50-0.9 MG/10ML-% IV SOSY
PREFILLED_SYRINGE | INTRAVENOUS | Status: DC | PRN
  Administered 2017-07-13 (×2): 5 mg via INTRAVENOUS
  Administered 2017-07-13: 10 mg via INTRAVENOUS

## 2017-07-13 MED ORDER — LUBIPROSTONE 8 MCG PO CAPS
8.0000 ug | ORAL_CAPSULE | Freq: Every day | ORAL | Status: DC
  Administered 2017-07-14 – 2017-07-16 (×2): 8 ug via ORAL
  Filled 2017-07-13 (×3): qty 1

## 2017-07-13 MED ORDER — HEPARIN SODIUM (PORCINE) 5000 UNIT/ML IJ SOLN
5000.0000 [IU] | Freq: Three times a day (TID) | INTRAMUSCULAR | Status: DC
  Administered 2017-07-13 – 2017-07-19 (×17): 5000 [IU] via SUBCUTANEOUS
  Filled 2017-07-13 (×16): qty 1

## 2017-07-13 MED ORDER — SODIUM CHLORIDE 0.9 % IV SOLN
1000.0000 mL | INTRAVENOUS | Status: DC
  Administered 2017-07-13: 1000 mL via INTRAVENOUS

## 2017-07-13 MED ORDER — VANCOMYCIN HCL IN DEXTROSE 1-5 GM/200ML-% IV SOLN: 1000.0000 mg | Freq: Once | INTRAVENOUS | Status: DC

## 2017-07-13 MED ORDER — FENTANYL CITRATE (PF) 100 MCG/2ML IJ SOLN: 25.0000 ug | INTRAMUSCULAR | Status: DC | PRN

## 2017-07-13 MED ORDER — SODIUM CHLORIDE 0.9 % IV BOLUS (SEPSIS)
250.0000 mL | Freq: Once | INTRAVENOUS | Status: AC
  Administered 2017-07-13: 250 mL via INTRAVENOUS

## 2017-07-13 MED ORDER — AZTREONAM 2 G IJ SOLR
2.0000 g | Freq: Once | INTRAMUSCULAR | Status: AC
  Administered 2017-07-13: 2 g via INTRAVENOUS
  Filled 2017-07-13: qty 2

## 2017-07-13 SURGICAL SUPPLY — 14 items
BAG URO CATCHER STRL LF (MISCELLANEOUS) ×3 IMPLANT
CATH URET 5FR 28IN OPEN ENDED (CATHETERS) ×2 IMPLANT
CLOTH BEACON ORANGE TIMEOUT ST (SAFETY) ×3 IMPLANT
COVER FOOTSWITCH UNIV (MISCELLANEOUS) ×2 IMPLANT
COVER SURGICAL LIGHT HANDLE (MISCELLANEOUS) ×3 IMPLANT
GLOVE SURG SS PI 8.0 STRL IVOR (GLOVE) ×2 IMPLANT
GOWN STRL REUS W/TWL XL LVL3 (GOWN DISPOSABLE) ×3 IMPLANT
GUIDEWIRE ANG ZIPWIRE 038X150 (WIRE) ×2 IMPLANT
GUIDEWIRE STR DUAL SENSOR (WIRE) ×3 IMPLANT
MANIFOLD NEPTUNE II (INSTRUMENTS) ×3 IMPLANT
PACK CYSTO (CUSTOM PROCEDURE TRAY) ×3 IMPLANT
STENT URET 6FRX24 CONTOUR (STENTS) ×2 IMPLANT
TUBING CONNECTING 10 (TUBING) ×2 IMPLANT
TUBING CONNECTING 10' (TUBING) ×1

## 2017-07-13 NOTE — Consult Note (Signed)
Subjective: CC: Left flank pain.  Hx:  I was asked to see Alexandra Alexander by Dr. Daleen Bo for a left distal ureteral stone with sepsis.   She had the onset yesterday of left flank pain and nausea.  She began to have a fever to 102 earlier today.  She had no prior stones but she has had UTI's with the last in November.   She has some incontinence but no other voiding complaints.  She is confused today.    ROS:  Review of Systems  Constitutional: Positive for fever.  Respiratory: Negative for shortness of breath.   Cardiovascular: Negative for chest pain.  Gastrointestinal: Positive for nausea.  Genitourinary: Positive for flank pain.  Neurological:       Confusion.  All other systems reviewed and are negative.   Allergies  Allergen Reactions  . Codeine Hives and Itching  . Vancomycin Itching    Made the patient "feel like she was crawling out of her skin"    Past Medical History:  Diagnosis Date  . Cancer [facility]) breast  . H/O mastectomy right  . Hypertension     Past Surgical History:  Procedure Laterality Date  . ABDOMINAL HYSTERECTOMY    . BREAST SURGERY    . LEG AMPUTATION      Social History   Socioeconomic History  . Marital status: Widowed    Spouse name: Not on file  . Number of children: Not on file  . Years of education: Not on file  . Highest education level: Not on file  Social Needs  . Financial resource strain: Not on file  . Food insecurity - worry: Not on file  . Food insecurity - inability: Not on file  . Transportation needs - medical: Not on file  . Transportation needs - non-medical: Not on file  Occupational History  . Not on file  Tobacco Use  . Smoking status: Never Smoker  . Smokeless tobacco: Never Used  Substance and Sexual Activity  . Alcohol use: No  . Drug use: No  . Sexual activity: Not on file  Other Topics Concern  . Not on file  Social History Narrative  . Not on file    History reviewed. No pertinent family  history.  Anti-infectives: Anti-infectives (From admission, onward)   Start     Dose/Rate Route Frequency Ordered Stop   07/13/17 1215  piperacillin-tazobactam (ZOSYN) IVPB 3.375 g     3.375 g 100 mL/hr over 30 Minutes Intravenous  Once 07/13/17 1210 07/13/17 1322   07/13/17 0930  levofloxacin (LEVAQUIN) IVPB 750 mg     750 mg 100 mL/hr over 90 Minutes Intravenous  Once 07/13/17 0925 07/13/17 1138   07/13/17 0930  aztreonam (AZACTAM) 2 g in dextrose 5 % 50 mL IVPB     2 g 100 mL/hr over 30 Minutes Intravenous  Once 07/13/17 0925 07/13/17 1059   07/13/17 0930  vancomycin (VANCOCIN) IVPB 1000 mg/200 mL premix  Status:  Discontinued     1,000 mg 200 mL/hr over 60 Minutes Intravenous  Once 07/13/17 3295 07/13/17 0926      Current Facility-Administered Medications  Medication Dose Route Frequency Provider Last Rate Last Dose  . 0.9 %  sodium chloride infusion  1,000 mL Intravenous Continuous Lacretia Leigh, MD      . CENTRUM SILVER 50+WOMEN TABS 1 tablet  1 tablet Oral Daily Buriev, Arie Sabina, MD      . escitalopram (LEXAPRO) tablet 10 mg  10 mg Oral Daily Buriev, Ulugbek  N, MD      . fentaNYL (DURAGESIC - dosed mcg/hr) patch 50 mcg  50 mcg Transdermal Q72H Buriev, Arie Sabina, MD      . heparin injection 5,000 Units  5,000 Units Subcutaneous Q8H Buriev, Arie Sabina, MD      . Derrill Memo ON 07/14/2017] levothyroxine (SYNTHROID, LEVOTHROID) tablet 75 mcg  75 mcg Oral QAC breakfast Buriev, Arie Sabina, MD      . lubiprostone (AMITIZA) capsule 8 mcg  8 mcg Oral Daily Buriev, Arie Sabina, MD      . Lutein-Zeaxanthin 20-1 MG CAPS 1 capsule  1 capsule Oral Daily Kinnie Feil, MD      . Derrill Memo ON 07/14/2017] nebivolol (BYSTOLIC) tablet 10 mg  10 mg Oral Daily Buriev, Ulugbek N, MD      . polyethylene glycol (MIRALAX / GLYCOLAX) packet 17 g  17 g Oral Daily PRN Kinnie Feil, MD       Current Outpatient Medications  Medication Sig Dispense Refill  . acetaminophen (TYLENOL) 650 MG CR tablet Take 650 mg  by mouth 2 (two) times daily.    Marland Kitchen escitalopram (LEXAPRO) 10 MG tablet Take 10 mg by mouth daily.     . fentaNYL (DURAGESIC - DOSED MCG/HR) 50 MCG/HR Place 50 mcg onto the skin every 3 (three) days. REMOVE OLD ONE PRIOR TO APPLYING NEW ONE    . levothyroxine (SYNTHROID, LEVOTHROID) 75 MCG tablet Take 75 mcg by mouth daily before breakfast.    . losartan (COZAAR) 100 MG tablet Take 100 mg by mouth daily.    Marland Kitchen lubiprostone (AMITIZA) 8 MCG capsule Take 8 mcg by mouth daily.    . Lutein-Zeaxanthin 20-1 MG CAPS Take 1 capsule by mouth daily.    . Multiple Vitamins-Minerals (CENTRUM SILVER 50+WOMEN) TABS Take 1 tablet by mouth daily.    . nebivolol (BYSTOLIC) 10 MG tablet Take 10 mg by mouth daily.    . Omega-3 Fatty Acids (FISH OIL) 1000 MG CAPS Take 1,000 mg by mouth every evening.     . polyethylene glycol (MIRALAX / GLYCOLAX) packet Take 17 g by mouth daily as needed for mild constipation.    Marland Kitchen doxycycline (VIBRAMYCIN) 100 MG capsule Take 1 capsule (100 mg total) by mouth 2 (two) times daily. (Patient not taking: Reported on 05/03/2017) 10 capsule 0     Objective: Vital signs in last 24 hours: Temp:  [101.9 F (38.8 C)] 101.9 F (38.8 C) (02/03 0921) Pulse Rate:  [73-77] 76 (02/03 1227) Resp:  [20-25] 20 (02/03 1227) BP: (108-153)/(49-85) 115/49 (02/03 1227) SpO2:  [93 %-96 %] 96 % (02/03 1227) Weight:  [56.7 kg (125 lb)] 56.7 kg (125 lb) (02/03 0920)  Intake/Output from previous day: No intake/output data recorded. Intake/Output this shift: Total I/O In: 1900 [IV Piggyback:1900] Out: -    Physical Exam  Constitutional: She is well-developed, well-nourished, and in no distress.  Elderly, cachectic WF in NAD.   HENT:  Head: Normocephalic and atraumatic.  edentulous  Neck: Normal range of motion. Neck supple. No thyromegaly present.  Cardiovascular: Normal rate.  Pulmonary/Chest: Effort normal and breath sounds normal. No respiratory distress.  Abdominal: Soft. Bowel sounds are  normal. She exhibits no mass. There is tenderness (left UQ and flank tenderness. ).  Musculoskeletal: Normal range of motion. She exhibits no edema or tenderness.  Left BKA  Neurological: She is alert.  Oriented to person  Skin: Skin is warm and dry.  Vitals reviewed.   Lab Results:  Recent Labs  07/13/17 0915  WBC 9.2  HGB 12.0  HCT 34.9*  PLT 107*   BMET Recent Labs    07/13/17 0915  NA 143  K 3.6  CL 107  CO2 18*  GLUCOSE 176*  BUN 41*  CREATININE 2.39*  CALCIUM 9.2   PT/INR No results for input(s): LABPROT, INR in the last 72 hours. ABG No results for input(s): PHART, HCO3 in the last 72 hours.  Invalid input(s): PCO2, PO2  Studies/Results: Ct Abdomen Pelvis Wo Contrast  Result Date: 07/13/2017 CLINICAL DATA:  Fever, abdominal pain, nausea EXAM: CT ABDOMEN AND PELVIS WITHOUT CONTRAST TECHNIQUE: Multidetector CT imaging of the abdomen and pelvis was performed following the standard protocol without IV contrast. COMPARISON:  None. FINDINGS: Motion degraded images. Evaluation of the upper abdomen is also limited due to streak artifact from the patient's arms. Lower chest: Lung bases are clear. Hepatobiliary: Unenhanced liver is grossly unremarkable. Gallbladder is grossly unremarkable. No intrahepatic or extrahepatic ductal dilatation. Pancreas: Grossly unremarkable. Spleen: Within normal limits. Adrenals/Urinary Tract: Adrenal glands are grossly unremarkable. Right kidney is within normal limits. Left kidney is poorly evaluated due to motion degradation but mild left hydronephrosis with perinephric fluid/stranding is suspected. Associated 4 mm distal left ureteral calculus (series 2/image 58). Bladder is within normal limits. Stomach/Bowel: Stomach is grossly unremarkable. No evidence of bowel obstruction. Appendix is not discretely visualized. Vascular/Lymphatic: No evidence of abdominal aortic aneurysm. Atherosclerotic calcifications of the abdominal aorta and branch  vessels. No suspicious abdominopelvic lymphadenopathy. Reproductive: Status post hysterectomy. No adnexal masses. Other: No abdominopelvic ascites. Musculoskeletal: Degenerative changes of the visualized thoracolumbar spine. Lumbar levoscoliosis. IMPRESSION: Limited evaluation due to motion degradation and streak artifact. 4 mm distal left ureteral calculus. Suspected associated mild left hydronephrosis. Electronically Signed   By: Julian Hy M.D.   On: 07/13/2017 12:17   Dg Chest Port 1 View  Result Date: 07/13/2017 CLINICAL DATA:  Confusion, fever and weakness. EXAM: PORTABLE CHEST 1 VIEW COMPARISON:  Chest CT 06/24/2014 FINDINGS: Enlarged cardiac silhouette. Calcific atherosclerotic disease of the aorta and tortuosity. There is no evidence of focal airspace consolidation, pleural effusion or pneumothorax. Low lung volumes. Probable left pleural effusion. Osteolytic changes of the proximal left clavicle. Soft tissues are grossly normal. IMPRESSION: Probable left pleural effusion. Enlarged cardiac silhouette. Calcific atherosclerotic disease of the aorta. Osteolytic changes of the proximal left clavicle, at the site of previously demonstrated fracture. Please correlate clinically as osteomyelitis may have a similar appearance. Electronically Signed   By: Fidela Salisbury M.D.   On: 07/13/2017 10:06   I have discussed her case with Dr. Daleen Bo and have reviewed her CT films and report, UA and labs.   Assessment: Left distal ureteral stone with sepsis of urinary origin.  I am going to take her for cystoscopy with insertion of a left ureteral stent.  I have reviewed the risks of bleeding, infection, ureteral injury, need for secondary procedures to remove the stone, thrombotic events and anesthetic complications.     CC: Dr. Rowe Clack.      Irine Seal 07/13/2017 [phone]

## 2017-07-13 NOTE — H&P (View-Only) (Signed)
Subjective: CC: Left flank pain.  Hx:  I was asked to see Alexandra Alexander by Dr. Daleen Bo for a left distal ureteral stone with sepsis.   She had the onset yesterday of left flank pain and nausea.  She began to have a fever to 102 earlier today.  She had no prior stones but she has had UTI's with the last in November.   She has some incontinence but no other voiding complaints.  She is confused today.    ROS:  Review of Systems  Constitutional: Positive for fever.  Respiratory: Negative for shortness of breath.   Cardiovascular: Negative for chest pain.  Gastrointestinal: Positive for nausea.  Genitourinary: Positive for flank pain.  Neurological:       Confusion.  All other systems reviewed and are negative.   Allergies  Allergen Reactions  . Codeine Hives and Itching  . Vancomycin Itching    Made the patient "feel like she was crawling out of her skin"    Past Medical History:  Diagnosis Date  . Cancer [facility]) breast  . H/O mastectomy right  . Hypertension     Past Surgical History:  Procedure Laterality Date  . ABDOMINAL HYSTERECTOMY    . BREAST SURGERY    . LEG AMPUTATION      Social History   Socioeconomic History  . Marital status: Widowed    Spouse name: Not on file  . Number of children: Not on file  . Years of education: Not on file  . Highest education level: Not on file  Social Needs  . Financial resource strain: Not on file  . Food insecurity - worry: Not on file  . Food insecurity - inability: Not on file  . Transportation needs - medical: Not on file  . Transportation needs - non-medical: Not on file  Occupational History  . Not on file  Tobacco Use  . Smoking status: Never Smoker  . Smokeless tobacco: Never Used  Substance and Sexual Activity  . Alcohol use: No  . Drug use: No  . Sexual activity: Not on file  Other Topics Concern  . Not on file  Social History Narrative  . Not on file    History reviewed. No pertinent family  history.  Anti-infectives: Anti-infectives (From admission, onward)   Start     Dose/Rate Route Frequency Ordered Stop   07/13/17 1215  piperacillin-tazobactam (ZOSYN) IVPB 3.375 g     3.375 g 100 mL/hr over 30 Minutes Intravenous  Once 07/13/17 1210 07/13/17 1322   07/13/17 0930  levofloxacin (LEVAQUIN) IVPB 750 mg     750 mg 100 mL/hr over 90 Minutes Intravenous  Once 07/13/17 0925 07/13/17 1138   07/13/17 0930  aztreonam (AZACTAM) 2 g in dextrose 5 % 50 mL IVPB     2 g 100 mL/hr over 30 Minutes Intravenous  Once 07/13/17 0925 07/13/17 1059   07/13/17 0930  vancomycin (VANCOCIN) IVPB 1000 mg/200 mL premix  Status:  Discontinued     1,000 mg 200 mL/hr over 60 Minutes Intravenous  Once 07/13/17 9518 07/13/17 0926      Current Facility-Administered Medications  Medication Dose Route Frequency Provider Last Rate Last Dose  . 0.9 %  sodium chloride infusion  1,000 mL Intravenous Continuous Lacretia Leigh, MD      . CENTRUM SILVER 50+WOMEN TABS 1 tablet  1 tablet Oral Daily Buriev, Arie Sabina, MD      . escitalopram (LEXAPRO) tablet 10 mg  10 mg Oral Daily Buriev, Ulugbek  N, MD      . fentaNYL (DURAGESIC - dosed mcg/hr) patch 50 mcg  50 mcg Transdermal Q72H Buriev, Arie Sabina, MD      . heparin injection 5,000 Units  5,000 Units Subcutaneous Q8H Buriev, Arie Sabina, MD      . Derrill Memo ON 07/14/2017] levothyroxine (SYNTHROID, LEVOTHROID) tablet 75 mcg  75 mcg Oral QAC breakfast Buriev, Arie Sabina, MD      . lubiprostone (AMITIZA) capsule 8 mcg  8 mcg Oral Daily Buriev, Arie Sabina, MD      . Lutein-Zeaxanthin 20-1 MG CAPS 1 capsule  1 capsule Oral Daily Kinnie Feil, MD      . Derrill Memo ON 07/14/2017] nebivolol (BYSTOLIC) tablet 10 mg  10 mg Oral Daily Buriev, Ulugbek N, MD      . polyethylene glycol (MIRALAX / GLYCOLAX) packet 17 g  17 g Oral Daily PRN Kinnie Feil, MD       Current Outpatient Medications  Medication Sig Dispense Refill  . acetaminophen (TYLENOL) 650 MG CR tablet Take 650 mg  by mouth 2 (two) times daily.    Marland Kitchen escitalopram (LEXAPRO) 10 MG tablet Take 10 mg by mouth daily.     . fentaNYL (DURAGESIC - DOSED MCG/HR) 50 MCG/HR Place 50 mcg onto the skin every 3 (three) days. REMOVE OLD ONE PRIOR TO APPLYING NEW ONE    . levothyroxine (SYNTHROID, LEVOTHROID) 75 MCG tablet Take 75 mcg by mouth daily before breakfast.    . losartan (COZAAR) 100 MG tablet Take 100 mg by mouth daily.    Marland Kitchen lubiprostone (AMITIZA) 8 MCG capsule Take 8 mcg by mouth daily.    . Lutein-Zeaxanthin 20-1 MG CAPS Take 1 capsule by mouth daily.    . Multiple Vitamins-Minerals (CENTRUM SILVER 50+WOMEN) TABS Take 1 tablet by mouth daily.    . nebivolol (BYSTOLIC) 10 MG tablet Take 10 mg by mouth daily.    . Omega-3 Fatty Acids (FISH OIL) 1000 MG CAPS Take 1,000 mg by mouth every evening.     . polyethylene glycol (MIRALAX / GLYCOLAX) packet Take 17 g by mouth daily as needed for mild constipation.    Marland Kitchen doxycycline (VIBRAMYCIN) 100 MG capsule Take 1 capsule (100 mg total) by mouth 2 (two) times daily. (Patient not taking: Reported on 05/03/2017) 10 capsule 0     Objective: Vital signs in last 24 hours: Temp:  [101.9 F (38.8 C)] 101.9 F (38.8 C) (02/03 0921) Pulse Rate:  [73-77] 76 (02/03 1227) Resp:  [20-25] 20 (02/03 1227) BP: (108-153)/(49-85) 115/49 (02/03 1227) SpO2:  [93 %-96 %] 96 % (02/03 1227) Weight:  [56.7 kg (125 lb)] 56.7 kg (125 lb) (02/03 0920)  Intake/Output from previous day: No intake/output data recorded. Intake/Output this shift: Total I/O In: 1900 [IV Piggyback:1900] Out: -    Physical Exam  Constitutional: She is well-developed, well-nourished, and in no distress.  Elderly, cachectic WF in NAD.   HENT:  Head: Normocephalic and atraumatic.  edentulous  Neck: Normal range of motion. Neck supple. No thyromegaly present.  Cardiovascular: Normal rate.  Pulmonary/Chest: Effort normal and breath sounds normal. No respiratory distress.  Abdominal: Soft. Bowel sounds are  normal. She exhibits no mass. There is tenderness (left UQ and flank tenderness. ).  Musculoskeletal: Normal range of motion. She exhibits no edema or tenderness.  Left BKA  Neurological: She is alert.  Oriented to person  Skin: Skin is warm and dry.  Vitals reviewed.   Lab Results:  Recent Labs  07/13/17 0915  WBC 9.2  HGB 12.0  HCT 34.9*  PLT 107*   BMET Recent Labs    07/13/17 0915  NA 143  K 3.6  CL 107  CO2 18*  GLUCOSE 176*  BUN 41*  CREATININE 2.39*  CALCIUM 9.2   PT/INR No results for input(s): LABPROT, INR in the last 72 hours. ABG No results for input(s): PHART, HCO3 in the last 72 hours.  Invalid input(s): PCO2, PO2  Studies/Results: Ct Abdomen Pelvis Wo Contrast  Result Date: 07/13/2017 CLINICAL DATA:  Fever, abdominal pain, nausea EXAM: CT ABDOMEN AND PELVIS WITHOUT CONTRAST TECHNIQUE: Multidetector CT imaging of the abdomen and pelvis was performed following the standard protocol without IV contrast. COMPARISON:  None. FINDINGS: Motion degraded images. Evaluation of the upper abdomen is also limited due to streak artifact from the patient's arms. Lower chest: Lung bases are clear. Hepatobiliary: Unenhanced liver is grossly unremarkable. Gallbladder is grossly unremarkable. No intrahepatic or extrahepatic ductal dilatation. Pancreas: Grossly unremarkable. Spleen: Within normal limits. Adrenals/Urinary Tract: Adrenal glands are grossly unremarkable. Right kidney is within normal limits. Left kidney is poorly evaluated due to motion degradation but mild left hydronephrosis with perinephric fluid/stranding is suspected. Associated 4 mm distal left ureteral calculus (series 2/image 58). Bladder is within normal limits. Stomach/Bowel: Stomach is grossly unremarkable. No evidence of bowel obstruction. Appendix is not discretely visualized. Vascular/Lymphatic: No evidence of abdominal aortic aneurysm. Atherosclerotic calcifications of the abdominal aorta and branch  vessels. No suspicious abdominopelvic lymphadenopathy. Reproductive: Status post hysterectomy. No adnexal masses. Other: No abdominopelvic ascites. Musculoskeletal: Degenerative changes of the visualized thoracolumbar spine. Lumbar levoscoliosis. IMPRESSION: Limited evaluation due to motion degradation and streak artifact. 4 mm distal left ureteral calculus. Suspected associated mild left hydronephrosis. Electronically Signed   By: Julian Hy M.D.   On: 07/13/2017 12:17   Dg Chest Port 1 View  Result Date: 07/13/2017 CLINICAL DATA:  Confusion, fever and weakness. EXAM: PORTABLE CHEST 1 VIEW COMPARISON:  Chest CT 06/24/2014 FINDINGS: Enlarged cardiac silhouette. Calcific atherosclerotic disease of the aorta and tortuosity. There is no evidence of focal airspace consolidation, pleural effusion or pneumothorax. Low lung volumes. Probable left pleural effusion. Osteolytic changes of the proximal left clavicle. Soft tissues are grossly normal. IMPRESSION: Probable left pleural effusion. Enlarged cardiac silhouette. Calcific atherosclerotic disease of the aorta. Osteolytic changes of the proximal left clavicle, at the site of previously demonstrated fracture. Please correlate clinically as osteomyelitis may have a similar appearance. Electronically Signed   By: Fidela Salisbury M.D.   On: 07/13/2017 10:06   I have discussed her case with Dr. Daleen Bo and have reviewed her CT films and report, UA and labs.   Assessment: Left distal ureteral stone with sepsis of urinary origin.  I am going to take her for cystoscopy with insertion of a left ureteral stent.  I have reviewed the risks of bleeding, infection, ureteral injury, need for secondary procedures to remove the stone, thrombotic events and anesthetic complications.     CC: Dr. Rowe Clack.      Irine Seal 07/13/2017 [phone]

## 2017-07-13 NOTE — ED Provider Notes (Addendum)
[facility] DEPT Provider Note   CSN: [id number] Arrival date & time: 07/13/17  5465     History   Chief Complaint Chief Complaint  Patient presents with  . Fever    HPI Alexandra Alexander is a 84 y.o. female.  84 year old female presents from nursing home with fever times 1 day.  History of UTI in the past when she has had similar symptoms.  No reported cough or congestion.  No vomiting or diarrhea.  Patient has been more lethargic.  EMS was called and gave patient 500 mg of Tylenol and transported her here.  No further history obtainable due to her current state      Past Medical History:  Diagnosis Date  . Cancer [facility]) breast  . H/O mastectomy right  . Hypertension     Patient Active Problem List   Diagnosis Date Noted  . Hypertension 05/03/2017  . Mild renal insufficiency 05/03/2017    Past Surgical History:  Procedure Laterality Date  . ABDOMINAL HYSTERECTOMY    . BREAST SURGERY    . LEG AMPUTATION      OB History    No data available       Home Medications    Prior to Admission medications   Medication Sig Start Date End Date Taking? Authorizing Provider  acetaminophen (TYLENOL) 650 MG CR tablet Take 650 mg by mouth 2 (two) times daily.   Yes [provider]  escitalopram (LEXAPRO) 10 MG tablet Take 10 mg by mouth daily.    Yes [provider]  fentaNYL (DURAGESIC - DOSED MCG/HR) 50 MCG/HR Place 50 mcg onto the skin every 3 (three) days. REMOVE OLD ONE PRIOR TO APPLYING NEW ONE   Yes [provider]  levothyroxine (SYNTHROID, LEVOTHROID) 75 MCG tablet Take 75 mcg by mouth daily before breakfast.   Yes [provider]  losartan (COZAAR) 100 MG tablet Take 100 mg by mouth daily.   Yes [provider]  lubiprostone (AMITIZA) 8 MCG capsule Take 8 mcg by mouth daily.   Yes [provider]  Lutein-Zeaxanthin 20-1 MG CAPS Take 1 capsule by mouth daily.   Yes [provider]  Multiple Vitamins-Minerals (CENTRUM SILVER 50+WOMEN) TABS Take 1 tablet by mouth daily.   Yes [provider]  nebivolol (BYSTOLIC) 10 MG tablet Take 10 mg by mouth daily.   Yes [provider]  Omega-3 Fatty Acids (FISH OIL) 1000 MG CAPS Take 1,000 mg by mouth every evening.    Yes [provider]  polyethylene glycol (MIRALAX / GLYCOLAX) packet Take 17 g by mouth daily as needed for mild constipation.   Yes [provider]  doxycycline (VIBRAMYCIN) 100 MG capsule Take 1 capsule (100 mg total) by mouth 2 (two) times daily. Patient not taking: Reported on 05/03/2017 06/23/16   Recardo Evangelist, PA-C    Family History No family history on file.  Social History Social History   Tobacco Use  . Smoking status: Never Smoker  . Smokeless tobacco: Never Used  Substance Use Topics  . Alcohol use: No  . Drug use: No     Allergies   Codeine and Vancomycin   Review of Systems Review of Systems  Unable to perform ROS: Acuity of condition     Physical Exam Updated Vital Signs BP (!) 153/85 (BP Location: Right Arm)   Pulse 73   Temp (!) 101.9 F (38.8 C) Comment: Pt from SNF with fever and lethargy  Ht 1.549  m (5\' 1" )   Wt 56.7 kg (125 lb)   SpO2 94%   BMI 23.62 kg/m   Physical Exam  Constitutional: She appears well-developed and well-nourished. She appears lethargic.  Non-toxic appearance. No distress.  HENT:  Head: Normocephalic and atraumatic.  Eyes: Conjunctivae, EOM and lids are normal. Pupils are equal, round, and reactive to light.  Neck: Normal range of motion. Neck supple. No tracheal deviation present. No thyroid mass present.  Cardiovascular: Normal rate, regular rhythm and normal heart sounds. Exam reveals no gallop.  No murmur heard. Pulmonary/Chest: Effort normal. No stridor. No respiratory distress. She has decreased breath sounds in the right lower field and the left lower field. She has no wheezes. She has  no rhonchi. She has no rales.  Abdominal: Soft. Normal appearance and bowel sounds are normal. She exhibits no distension. There is no tenderness. There is no rebound and no CVA tenderness.  Musculoskeletal: Normal range of motion. She exhibits no edema or tenderness.  Neurological: She appears lethargic. She is disoriented. She displays atrophy. No cranial nerve deficit. GCS eye subscore is 4. GCS verbal subscore is 4. GCS motor subscore is 5.  Skin: Skin is warm and dry. No abrasion and no rash noted.  Psychiatric: Her speech is delayed.  Nursing note and vitals reviewed.    ED Treatments / Results  Labs (all labs ordered are listed, but only abnormal results are displayed) Labs Reviewed  I-STAT CG4 LACTIC ACID, ED - Abnormal; Notable for the following components:      Result Value   Lactic Acid, Venous 9.48 (*)    All other components within normal limits  CULTURE, BLOOD (ROUTINE X 2)  CULTURE, BLOOD (ROUTINE X 2)  COMPREHENSIVE METABOLIC PANEL  CBC WITH DIFFERENTIAL/PLATELET  URINALYSIS, ROUTINE W REFLEX MICROSCOPIC  INFLUENZA PANEL BY PCR (TYPE A & B)  TSH  I-STAT CG4 LACTIC ACID, ED  I-STAT CG4 LACTIC ACID, ED    EKG  EKG Interpretation None       Radiology No results found.  Procedures Procedures (including critical care time)  Medications Ordered in ED Medications  sodium chloride 0.9 % bolus 1,000 mL (not administered)    And  sodium chloride 0.9 % bolus 500 mL (not administered)    And  sodium chloride 0.9 % bolus 250 mL (not administered)  0.9 %  sodium chloride infusion (not administered)  levofloxacin (LEVAQUIN) IVPB 750 mg (not administered)  aztreonam (AZACTAM) 2 g in dextrose 5 % 50 mL IVPB (not administered)     Initial Impression / Assessment and Plan / ED Course  I have reviewed the triage vital signs and the nursing notes.  Pertinent labs & imaging results that were available during my care of the patient were reviewed by me and considered  in my medical decision making (see chart for details).     Patient's lactate noted and was 9.8.  Code sepsis called and patient given IV fluids as well as appropriate antibiotics.  Family arrived and patient is a DNR.  Patient's blood pressure monitor here remains appropriate.  She had been given antibiotics prior to arrival.  Urinalysis is pending at this time.  Chest x-ray does shows a pleural effusion.  Will consult hospitalist for admission  CRITICAL CARE Performed by: Leota Jacobsen Total critical care time: 50 minutes Critical care time was exclusive of separately billable procedures and treating other patients. Critical care was necessary to treat or prevent imminent or life-threatening deterioration. Critical care was time  spent personally by me on the following activities: development of treatment plan with patient and/or surrogate as well as nursing, discussions with consultants, evaluation of patient's response to treatment, examination of patient, obtaining history from patient or surrogate, ordering and performing treatments and interventions, ordering and review of laboratory studies, ordering and review of radiographic studies, pulse oximetry and re-evaluation of patient's condition.     Final Clinical Impressions(s) / ED Diagnoses   Final diagnoses:  None    ED Discharge Orders    None       Lacretia Leigh, MD 07/13/17 1054    Lacretia Leigh, MD 07/13/17 1055

## 2017-07-13 NOTE — Anesthesia Postprocedure Evaluation (Signed)
Anesthesia Post Note  Patient: Alexandra Alexander  Procedure(s) Performed: CYSTOSCOPY, LEFT RETROGRADE, LEFT URETEROSCOPY WITH LEFT URETERAL STENT PLACEMENT (Left Ureter)     Patient location during evaluation: PACU Anesthesia Type: MAC Level of consciousness: awake and alert Pain management: pain level controlled Vital Signs Assessment: post-procedure vital signs reviewed and stable Respiratory status: spontaneous breathing Cardiovascular status: stable Anesthetic complications: no    Last Vitals:  Vitals:   07/13/17 1630 07/13/17 1645  BP: 114/65 117/64  Pulse: 71 70  Resp: (!) 24 20  Temp: 36.6 C 36.6 C  SpO2: 97% 97%    Last Pain:  Vitals:   07/13/17 0920  TempSrc: Oral                 Nolon Nations

## 2017-07-13 NOTE — Anesthesia Preprocedure Evaluation (Addendum)
Anesthesia Evaluation  Patient identified by MRN, date of birth, ID band Patient awake    Reviewed: Allergy & Precautions, NPO status , Patient's Chart, lab work & pertinent test results  Airway Mallampati: II  TM Distance: >3 FB Neck ROM: Full    Dental no notable dental hx.    Pulmonary neg pulmonary ROS,    Pulmonary exam normal breath sounds clear to auscultation       Cardiovascular hypertension, Normal cardiovascular exam Rhythm:Regular Rate:Normal     Neuro/Psych negative neurological ROS  negative psych ROS   GI/Hepatic negative GI ROS, Neg liver ROS,   Endo/Other  negative endocrine ROSHypothyroidism   Renal/GU Renal disease  negative genitourinary   Musculoskeletal negative musculoskeletal ROS (+)   Abdominal   Peds negative pediatric ROS (+)  Hematology negative hematology ROS (+)   Anesthesia Other Findings   Reproductive/Obstetrics negative OB ROS                             Anesthesia Physical Anesthesia Plan  ASA: III and emergent  Anesthesia Plan: MAC   Post-op Pain Management:    Induction: Intravenous  PONV Risk Score and Plan: 4 or greater and Ondansetron, Dexamethasone and Treatment may vary due to age or medical condition  Airway Management Planned:   Additional Equipment:   Intra-op Plan:   Post-operative Plan:   Informed Consent: I have reviewed the patients History and Physical, chart, labs and discussed the procedure including the risks, benefits and alternatives for the proposed anesthesia with the patient or authorized representative who has indicated his/her understanding and acceptance.   Dental advisory given  Plan Discussed with: CRNA  Anesthesia Plan Comments:       Anesthesia Quick Evaluation

## 2017-07-13 NOTE — ED Notes (Signed)
PO Meds held, pt currently unable to swallow safely

## 2017-07-13 NOTE — Transfer of Care (Signed)
Immediate Anesthesia Transfer of Care Note  Patient: Alexandra Alexander  Procedure(s) Performed: CYSTOSCOPY, LEFT RETROGRADE, LEFT URETEROSCOPY WITH LEFT URETERAL STENT PLACEMENT (Left Ureter)  Patient Location: PACU  Anesthesia Type:MAC  Level of Consciousness: awake, alert  and oriented  Airway & Oxygen Therapy: Patient Spontanous Breathing and Patient connected to face mask oxygen  Post-op Assessment: Report given to RN and Post -op Vital signs reviewed and stable  Post vital signs: Reviewed and stable  Last Vitals:  Vitals:   07/13/17 1227 07/13/17 1300  BP: (!) 115/49 (!) 108/58  Pulse: 76 77  Resp: 20 (!) 30  Temp:    SpO2: 96% 95%    Last Pain:  Vitals:   07/13/17 0920  TempSrc: Oral         Complications: No apparent anesthesia complications

## 2017-07-13 NOTE — Progress Notes (Signed)
CT abd: IMPRESSION: Limited evaluation due to motion degradation and streak artifact.  4 mm distal left ureteral calculus. Suspected associated mild left Hydronephrosis.  Suspect left pyelonephritis, nephrolithiasis, hydronephrosis. D/w urology Dr. Jeffie Pollock who plans to review images for possible stenting. D/w patient, updated her family at the bedside Daleen Bo, Arie Sabina

## 2017-07-13 NOTE — ED Notes (Signed)
Bed: WA21 Expected date:  Expected time:  Means of arrival:  Comments: 84 yo fever

## 2017-07-13 NOTE — ED Notes (Signed)
ED TO INPATIENT HANDOFF REPORT  Name/Age/Gender Alexandra Alexander 84 y.o. female  Code Status    Code Status Orders  (From admission, onward)        Start     Ordered   07/13/17 1159  Do not attempt resuscitation (DNR)  Continuous    Question Answer Comment  In the event of cardiac or respiratory ARREST Do not call a "code blue"   In the event of cardiac or respiratory ARREST Do not perform Intubation, CPR, defibrillation or ACLS   In the event of cardiac or respiratory ARREST Use medication by any route, position, wound care, and other measures to relive pain and suffering. May use oxygen, suction and manual treatment of airway obstruction as needed for comfort.   Comments patient, her family      07/13/17 1200    Code Status History    Date Active Date Inactive Code Status Order ID Comments User Context   This patient has a current code status but no historical code status.      Home/SNF/Other Nursing Home  Chief Complaint Fever  Level of Care/Admitting Diagnosis ED Disposition    ED Disposition Condition [facility]: [place] [[id number]]  Level of Care: Stepdown [14]  Admit to SDU based on following criteria: Severe physiological/psychological symptoms:  Any diagnosis requiring assessment & intervention at least every 4 hours on an ongoing basis to obtain desired patient outcomes including stability and rehabilitation  Admit to SDU based on following criteria: Cardiac Instability:  Patients experiencing chest pain, unconfirmed MI and stable, arrhythmias and CHF requiring medical management and potentially compromising patient's stability  Diagnosis: Sepsis [facility]) [[id number]]  Admitting Physician: Kinnie Feil [[id number]]  Attending Physician: Kinnie Feil [[id number]]  Estimated length of stay: past midnight tomorrow  Certification:: I certify this patient will need inpatient services for at least 2 midnights  PT Class (Do Not  Modify): Inpatient [101]  PT Acc Code (Do Not Modify): Private [1]       Medical History Past Medical History:  Diagnosis Date  . Cancer [facility]) breast  . H/O mastectomy right  . Hypertension   . Hypothyroidism     Allergies Allergies  Allergen Reactions  . Codeine Hives and Itching  . Vancomycin Itching    Made the patient "feel like she was crawling out of her skin"    IV Location/Drains/Wounds Patient Lines/Drains/Airways Status   Active Line/Drains/Airways    Name:   Placement date:   Placement time:   Site:   Days:   Peripheral IV 07/13/17 Left Forearm   07/13/17    0901    Forearm   less than 1          Labs/Imaging Results for orders placed or performed during the hospital encounter of 07/13/17 (from the past 48 hour(s))  Comprehensive metabolic panel     Status: Abnormal   Collection Time: 07/13/17  9:15 AM  Result Value Ref Range   Sodium 143 135 - 145 mmol/L   Potassium 3.6 3.5 - 5.1 mmol/L   Chloride 107 101 - 111 mmol/L   CO2 18 (L) 22 - 32 mmol/L   Glucose, Bld 176 (H) 65 - 99 mg/dL   BUN 41 (H) 6 - 20 mg/dL   Creatinine, Ser 2.39 (H) 0.44 - 1.00 mg/dL   Calcium 9.2 8.9 - 10.3 mg/dL   Total Protein 6.7 6.5 - 8.1 g/dL   Albumin 3.7 3.5 - 5.0  g/dL   AST 92 (H) 15 - 41 U/L   ALT 57 (H) 14 - 54 U/L    Comment: RESULTS CONFIRMED BY MANUAL DILUTION   Alkaline Phosphatase 167 (H) 38 - 126 U/L   Total Bilirubin 0.8 0.3 - 1.2 mg/dL   GFR calc non Af Amer 16 (L) >60 mL/min   GFR calc Af Amer 19 (L) >60 mL/min    Comment: (NOTE) The eGFR has been calculated using the CKD EPI equation. This calculation has not been validated in all clinical situations. eGFR's persistently <60 mL/min signify possible Chronic Kidney Disease.    Anion gap 18 (H) 5 - 15    Comment: Performed at [facility], [place] [address], [place], [place] [address]  CBC WITH DIFFERENTIAL     Status: Abnormal   Collection Time: 07/13/17  9:15 AM  Result Value Ref Range    WBC 9.2 4.0 - 10.5 K/uL   RBC 3.94 3.87 - 5.11 MIL/uL   Hemoglobin 12.0 12.0 - 15.0 g/dL   HCT 34.9 (L) 36.0 - 46.0 %   MCV 88.6 78.0 - 100.0 fL   MCH 30.5 26.0 - 34.0 pg   MCHC 34.4 30.0 - 36.0 g/dL   RDW 13.8 11.5 - 15.5 %   Platelets 107 (L) 150 - 400 K/uL    Comment: REPEATED TO VERIFY SPECIMEN CHECKED FOR CLOTS PLATELET COUNT CONFIRMED BY SMEAR    Neutrophils Relative % 95 %   Lymphocytes Relative 4 %   Monocytes Relative 1 %   Eosinophils Relative 0 %   Basophils Relative 0 %   Neutro Abs 8.7 (H) 1.7 - 7.7 K/uL   Lymphs Abs 0.4 (L) 0.7 - 4.0 K/uL   Monocytes Absolute 0.1 0.1 - 1.0 K/uL   Eosinophils Absolute 0.0 0.0 - 0.7 K/uL   Basophils Absolute 0.0 0.0 - 0.1 K/uL    Comment: Performed at [facility], [place] [address] [place], [place] [address]  I-Stat CG4 Lactic Acid, ED     Status: Abnormal   Collection Time: 07/13/17  9:23 AM  Result Value Ref Range   Lactic Acid, Venous 9.48 (HH) 0.5 - 1.9 mmol/L   Comment NOTIFIED PHYSICIAN   Influenza panel by PCR (type A & B)     Status: None   Collection Time: 07/13/17  9:30 AM  Result Value Ref Range   Influenza A By PCR NEGATIVE NEGATIVE   Influenza B By PCR NEGATIVE NEGATIVE    Comment: (NOTE) The Xpert Xpress Flu assay is intended as an aid in the diagnosis of  influenza and should not be used as a sole basis for treatment.  This  assay is FDA approved for nasopharyngeal swab specimens only. Nasal  washings and aspirates are unacceptable for Xpert Xpress Flu testing. Performed at [facility], [place] [address] [place],  [address]   Urinalysis, Routine w reflex microscopic     Status: Abnormal   Collection Time: 07/13/17 11:40 AM  Result Value Ref Range   Color, Urine YELLOW YELLOW   APPearance CLOUDY (A) CLEAR   Specific Gravity, Urine 1.012 1.005 - 1.030   pH 5.0 5.0 - 8.0   Glucose, UA NEGATIVE NEGATIVE mg/dL   Hgb urine dipstick LARGE (A) NEGATIVE   Bilirubin  Urine NEGATIVE NEGATIVE   Ketones, ur NEGATIVE NEGATIVE mg/dL   Protein, ur 30 (A) NEGATIVE mg/dL   Nitrite POSITIVE (A) NEGATIVE   Leukocytes, UA MODERATE (A) NEGATIVE   RBC /  HPF TOO NUMEROUS TO COUNT 0 - 5 RBC/hpf   WBC, UA 6-30 0 - 5 WBC/hpf   Bacteria, UA MANY (A) NONE SEEN   Squamous Epithelial / LPF NONE SEEN NONE SEEN   Mucus PRESENT     Comment: Performed at [facility], [place] [address], [place], [place] [address]  I-Stat CG4 Lactic Acid, ED  (not at  [facility])     Status: Abnormal   Collection Time: 07/13/17 11:46 AM  Result Value Ref Range   Lactic Acid, Venous 7.83 (HH) 0.5 - 1.9 mmol/L   Comment NOTIFIED PHYSICIAN   Troponin I (q 6hr x 3)     Status: Abnormal   Collection Time: 07/13/17 12:25 PM  Result Value Ref Range   Troponin I 0.34 (HH) <0.03 ng/mL    Comment: CRITICAL RESULT CALLED TO, READ BACK BY AND VERIFIED WITH: J.Bernardo Heater 07/13/17 '@1324'$  BY V.WILKINS Performed at [facility] [address], [place], [place] [address]    Ct Abdomen Pelvis Wo Contrast  Result Date: 07/13/2017 CLINICAL DATA:  Fever, abdominal pain, nausea EXAM: CT ABDOMEN AND PELVIS WITHOUT CONTRAST TECHNIQUE: Multidetector CT imaging of the abdomen and pelvis was performed following the standard protocol without IV contrast. COMPARISON:  None. FINDINGS: Motion degraded images. Evaluation of the upper abdomen is also limited due to streak artifact from the patient's arms. Lower chest: Lung bases are clear. Hepatobiliary: Unenhanced liver is grossly unremarkable. Gallbladder is grossly unremarkable. No intrahepatic or extrahepatic ductal dilatation. Pancreas: Grossly unremarkable. Spleen: Within normal limits. Adrenals/Urinary Tract: Adrenal glands are grossly unremarkable. Right kidney is within normal limits. Left kidney is poorly evaluated due to motion degradation but mild left hydronephrosis with perinephric fluid/stranding is suspected. Associated 4 mm distal  left ureteral calculus (series 2/image 58). Bladder is within normal limits. Stomach/Bowel: Stomach is grossly unremarkable. No evidence of bowel obstruction. Appendix is not discretely visualized. Vascular/Lymphatic: No evidence of abdominal aortic aneurysm. Atherosclerotic calcifications of the abdominal aorta and branch vessels. No suspicious abdominopelvic lymphadenopathy. Reproductive: Status post hysterectomy. No adnexal masses. Other: No abdominopelvic ascites. Musculoskeletal: Degenerative changes of the visualized thoracolumbar spine. Lumbar levoscoliosis. IMPRESSION: Limited evaluation due to motion degradation and streak artifact. 4 mm distal left ureteral calculus. Suspected associated mild left hydronephrosis. Electronically Signed   By: Julian Hy M.D.   On: 07/13/2017 12:17   Dg Chest Port 1 View  Result Date: 07/13/2017 CLINICAL DATA:  Confusion, fever and weakness. EXAM: PORTABLE CHEST 1 VIEW COMPARISON:  Chest CT 06/24/2014 FINDINGS: Enlarged cardiac silhouette. Calcific atherosclerotic disease of the aorta and tortuosity. There is no evidence of focal airspace consolidation, pleural effusion or pneumothorax. Low lung volumes. Probable left pleural effusion. Osteolytic changes of the proximal left clavicle. Soft tissues are grossly normal. IMPRESSION: Probable left pleural effusion. Enlarged cardiac silhouette. Calcific atherosclerotic disease of the aorta. Osteolytic changes of the proximal left clavicle, at the site of previously demonstrated fracture. Please correlate clinically as osteomyelitis may have a similar appearance. Electronically Signed   By: Fidela Salisbury M.D.   On: 07/13/2017 10:06    Pending Labs Unresulted Labs (From admission, onward)   Start     Ordered   07/14/17 0500  Comprehensive metabolic panel  Tomorrow morning,   R     07/13/17 1200   07/14/17 0500  CBC WITH DIFFERENTIAL  Tomorrow morning,   R     07/13/17 1200   07/13/17 1352  MRSA PCR Screening   Once,   R  07/13/17 1352   07/13/17 1200  Lactic acid, plasma  STAT Now then every 3 hours,   R     07/13/17 1200   07/13/17 1154  Troponin I (q 6hr x 3)  Now then every 6 hours,   R     07/13/17 1154   07/13/17 0928  TSH  STAT,   STAT     07/13/17 0927   07/13/17 0925  Blood Culture (routine x 2)  BLOOD CULTURE X 2,   STAT     07/13/17 0925      Vitals/Pain Today's Vitals   07/13/17 1000 07/13/17 1100 07/13/17 1227 07/13/17 1300  BP: 123/79 (!) 108/56 (!) 115/49 (!) 108/58  Pulse: 73 77 76 77  Resp: 20 (!) 25 20 (!) 30  Temp:      TempSrc:      SpO2: 93% 94% 96% 95%  Weight:      Height:        Isolation Precautions No active isolations  Medications Medications  0.9 %  sodium chloride infusion (not administered)  escitalopram (LEXAPRO) tablet 10 mg (10 mg Oral Not Given 07/13/17 1254)  fentaNYL (DURAGESIC - dosed mcg/hr) patch 50 mcg (not administered)  levothyroxine (SYNTHROID, LEVOTHROID) tablet 75 mcg (not administered)  lubiprostone (AMITIZA) capsule 8 mcg (8 mcg Oral Not Given 07/13/17 1255)  Lutein-Zeaxanthin 20-1 MG CAPS 1 capsule (1 capsule Oral Not Given 07/13/17 1255)  CENTRUM SILVER 50+WOMEN TABS 1 tablet (1 tablet Oral Not Given 07/13/17 1255)  nebivolol (BYSTOLIC) tablet 10 mg (not administered)  polyethylene glycol (MIRALAX / GLYCOLAX) packet 17 g (not administered)  heparin injection 5,000 Units (not administered)  sodium chloride 0.9 % bolus 1,000 mL (0 mLs Intravenous Stopped 07/13/17 1015)    And  sodium chloride 0.9 % bolus 500 mL (0 mLs Intravenous Stopped 07/13/17 1059)    And  sodium chloride 0.9 % bolus 250 mL (0 mLs Intravenous Stopped 07/13/17 1059)  levofloxacin (LEVAQUIN) IVPB 750 mg (0 mg Intravenous Stopped 07/13/17 1138)  aztreonam (AZACTAM) 2 g in dextrose 5 % 50 mL IVPB (0 g Intravenous Stopped 07/13/17 1059)  piperacillin-tazobactam (ZOSYN) IVPB 3.375 g (0 g Intravenous Stopped 07/13/17 1325)    Mobility non-ambulatory

## 2017-07-13 NOTE — Progress Notes (Signed)
A consult was received from an ED physician for vancomycin/aztreonam/levaquin per pharmacy dosing.  The patient's profile has been reviewed for ht/wt/allergies/indication/available labs.   A one time order has been placed for vancomycin 1g, aztreonam 2g, levaquin 750mg .    Further antibiotics/pharmacy consults should be ordered by admitting physician if indicated.                       Thank you, Peggyann Juba, PharmD, BCPS 07/13/2017  9:42 AM

## 2017-07-13 NOTE — Progress Notes (Addendum)
Pharmacy Antibiotic Note  Alexandra Alexander is a 84 y.o. female admitted on 07/13/2017 with N/V, abd pain, febrile, met sepsis criteria. CXray with L pleural effusion.  Pharmacy has been consulted for Zosyn dosing. Plan Cysto and stent placement for L ureteral stone  Plan: Zosyn 3.375 gm x1 over 30 min in ED, followed by Zosyn 2.25gm q8hr Admit SCr 2.39 gm/dl, Cl ~ 11 ml/min, anticipate patient hypovolemic Previous SCr 11/24 was 1.4 gm/dl  MRSA PCR ordered to r/o any possible Staph aureus, Vancomycin not ordered, patient has allergy  Height: _0  (154.9 cm) Weight: 125 lb (56.7 kg) IBW/kg (Calculated) : 47.8  Temp (24hrs), Avg:101.9 F (38.8 C), Min:101.9 F (38.8 C), Max:101.9 F (38.8 C)  Recent Labs  Lab 07/13/17 0915 07/13/17 0923 07/13/17 1146  WBC 9.2  --   --   CREATININE 2.39*  --   --   LATICACIDVEN  --  9.48* 7.83*    Estimated Creatinine Clearance: 11.1 mL/min (A) (by C-G formula based on SCr of 2.39 mg/dL (H)).    Allergies  Allergen Reactions  . Codeine Hives and Itching  . Vancomycin Itching    Made the patient "feel like she was crawling out of her skin"   Antimicrobials this admission: 2/3 Levaquin/ Aztreonam x1 in ED 2/3 Zosyn >>   Dose adjustments this admission:  Microbiology results: 2/3 BCx: sent 2/3 Influenza PCR: neg/neg  2/3 MRSA PCR: in process  Thank you for allowing pharmacy to be a part of this patient's care.  Minda Ditto 07/13/2017 12:10 PM

## 2017-07-13 NOTE — ED Triage Notes (Signed)
Pt comes from [place] in [facility]. Pt arrived via [facility]. Pt has had fever since 0100 this morning 2017/07/13. Pt received 500mg  Tylenol orally around 0845. Pt is AOx3 to best of her ability via [facility].

## 2017-07-13 NOTE — Op Note (Signed)
Procedure: 1 cystoscopy with left retrograde pyelogram and interpretation.  2 cystoscopy with insertion of left double-J stent.  Preop diagnosis: Left distal ureteral stone with obstruction and sepsis.    Postoperative diagnosis: Same.  Surgeon: Dr. Irine Seal.  Anesthesia: MAC and local.  Drain: 6 [place] by 24 cm left contour double-J stent and 16 French Foley catheter.  Specimen: Urine culture from left renal pelvis.  Blood loss: Minimal.  Complication: None.  Indication: Alexandra Alexander is a 84 year old white female who presented to the emergency room with a 1 day history of left flank pain and nausea followed by a temperature to 102.  She was found to have a 4 mm obstructing left distal ureteral stone and signs of sepsis in the emergency room and it was felt that emergent cystoscopy and stent insertion was indicated.  Procedure: She had been given antibiotic in the ER.  She was taken operating room where she was placed on the table in the lithotomy position and was fitted with PAS hose.  She had a left BKA's only a right hose was placed.  She was given sedation as needed.  Her perineum and genitalia were prepped with Betadine solution and she was draped in usual sterile fashion.  Her urethra was instilled with 10 mL of 2% lidocaine jelly.  Cystoscopy was performed using the 23 [place] scope and 30 degree lens.  Inspection revealed a normal urethra the bladder wall had marked trabeculation with cellules and small diverticuli.  The mucosa had changes consistent with follicular cystitis particularly in the trigone.  The right ureteral orifice was unremarkable.  The left ureteral orifice was inside a small diverticulum but was effluxing bloody urine.  Initially an attempt was made to pass a 5 [place] opening catheter into the left ureteral orifice but that was not successful because of the location in the diverticulum.  Attempts with a guidewire through the opening catheter also unsuccessful.  I then  used a 6-1/2 [place] semirigid ureteroscope to directly visualize the orifice within the diverticulum and I passed a sensor wire through the scope.  The sensor wire passed into the proximal ureter but there was tortuosity and I could not get it to the kidney initially.  The opening catheter was reinserted over the wire to below the area of tortuosity and the wire was removed.  Contrast was instilled for retrograde pyelogram.  Retrograde pyelogram demonstrated 2 kinks in the proximal ureter with hydronephrosis.   I then passed an angled tip Glidewire and was able to get it into the kidney.  An attempt was made to pass the stent over the Glidewire but it looped into the bladder and attempts to straighten it out dislodge the wire.  I was then able to successfully reinsert the Glidewire using an opening catheter through the cystoscope and advanced it to the kidney.  The opening catheter was passed over the Glidewire to the kidney and the Glidewire was exchanged out for a sensor wire.  A 6 French 24 cm contour double-J stent was then passed successfully to the kidney under fluoroscopic guidance.  The wire was removed leaving a good coil in the kidney and a good coil in the bladder.  The cystoscope was then removed and a 16 French Silastic Foley catheter was inserted.  The balloon was filled with 10 cc of sterile fluid.  She was taken down from lithotomy position.  Her Sedation was allowed to reverse and she was taken to recovery in stable condition.  There were no  complications.

## 2017-07-13 NOTE — Progress Notes (Signed)
Troponin 0.34. Unlikely to perform intervention in the setting of renal issues in elderly patient. D/w Dr. Haroldine Laws who recommended to obtain echocardiogram, will start aspirin (called, agreed with urology), no heparin. cont monitor. D/w patient, updated her family at the bedside.  Kinnie Feil

## 2017-07-13 NOTE — Progress Notes (Signed)
Fentanyl Patch removed from right upper back, witnessed by Guerry Bruin, RN

## 2017-07-13 NOTE — H&P (Signed)
[facility] History and Physical  Alexandra Alexander JJO:[id number] DOB: Aug 12, 1921 DOA: 07/13/2017  Referring physician:  PCP: [name], MD  Specialists:   Chief Complaint: fever. abdominal pains  HPI: Alexandra Alexander is a 84 y.o. female with PMH of HTN, CKD, Chronic pain syndrome, UTIs  Presented with fever and abdominal pain. Patient is not a good historian. D/w her family at the bedside. Per family: patient started complaining of indigestion, associated with nausea yesterday around 4.00 pm. She had few episodes of regurgitation and small amount of vomiting associated with abdominal pains. Family decided to try some some laxatives for constipation, then she had a normal bowel movement. No hematemesis, no hematochezia. However, she started getting more tired fatigued and developed fevers and brought to emergency room for further evaluations. Patient is delirious, not a good historian in ED. She reports abdominal pains, nausea, no vomiting in ED. She denies acute shortness of breath, has had some dry cough. No focal weakness or paresthesias, no dizziness.  -ED: influenza is negative. Lactic acid-9. Creatinine -2.3. Febrile at 38.8. Started on empiric antibiotic treatment.  UA is pend. hospitalist is called for admission     Review of Systems: The patient denies anorexia, fever, weight loss,, vision loss, decreased hearing, hoarseness, chest pain, syncope, dyspnea on exertion, peripheral edema, balance deficits, hemoptysis, abdominal pain, melena, hematochezia, severe indigestion/heartburn, hematuria, incontinence, genital sores, muscle weakness, suspicious skin lesions, transient blindness, difficulty walking, depression, unusual weight change, abnormal bleeding, enlarged lymph nodes, angioedema, and breast masses.    Past Medical History:  Diagnosis Date  . Cancer [facility]) breast  . H/O mastectomy right  . Hypertension    Past Surgical History:  Procedure Laterality Date  .  ABDOMINAL HYSTERECTOMY    . BREAST SURGERY    . LEG AMPUTATION     Social History:  reports that  has never smoked. she has never used smokeless tobacco. She reports that she does not drink alcohol or use drugs. ALF.  where does patient live--home, ALF, SNF? and with whom if at home? Yes;  Can patient participate in ADLs?  Allergies  Allergen Reactions  . Codeine Hives and Itching  . Vancomycin Itching    Made the patient "feel like she was crawling out of her skin"    No family history on file.  (be sure to complete)  Prior to Admission medications   Medication Sig Start Date End Date Taking? Authorizing Provider  acetaminophen (TYLENOL) 650 MG CR tablet Take 650 mg by mouth 2 (two) times daily.   Yes [provider]  escitalopram (LEXAPRO) 10 MG tablet Take 10 mg by mouth daily.    Yes [provider]  fentaNYL (DURAGESIC - DOSED MCG/HR) 50 MCG/HR Place 50 mcg onto the skin every 3 (three) days. REMOVE OLD ONE PRIOR TO APPLYING NEW ONE   Yes [provider]  levothyroxine (SYNTHROID, LEVOTHROID) 75 MCG tablet Take 75 mcg by mouth daily before breakfast.   Yes [provider]  losartan (COZAAR) 100 MG tablet Take 100 mg by mouth daily.   Yes [provider]  lubiprostone (AMITIZA) 8 MCG capsule Take 8 mcg by mouth daily.   Yes [provider]  Lutein-Zeaxanthin 20-1 MG CAPS Take 1 capsule by mouth daily.   Yes [provider]  Multiple Vitamins-Minerals (CENTRUM SILVER 50+WOMEN) TABS Take 1 tablet by mouth daily.   Yes [provider]  nebivolol (BYSTOLIC) 10 MG tablet Take 10 mg by mouth daily.   Yes  [provider]  Omega-3 Fatty Acids (FISH OIL) 1000 MG CAPS Take 1,000 mg by mouth every evening.    Yes [provider]  polyethylene glycol (MIRALAX / GLYCOLAX) packet Take 17 g by mouth daily as needed for mild constipation.   Yes [provider]  doxycycline (VIBRAMYCIN) 100 MG capsule  Take 1 capsule (100 mg total) by mouth 2 (two) times daily. Patient not taking: Reported on 05/03/2017 06/23/16   Recardo Evangelist, PA-C   Physical Exam: Vitals:   07/13/17 0921 07/13/17 1000  BP:  123/79  Pulse:  73  Resp:  20  Temp: (!) 101.9 F (38.8 C)   SpO2:  93%     General:  Alert. delirious   Eyes: eom-i  ENT: no oral ulcers   Neck: supple, no JVD  Cardiovascular: s1,s2 rrr  Respiratory: few rales LLL  Abdomen: soft, mild tender in left side  Skin: no rash   Musculoskeletal: no pedal edema   Psychiatric: delirious   Neurologic: CN 2-12 intact. Motor: able to move extremities. Does not fully follow commands   Labs on Admission:  Basic Metabolic Panel: Recent Labs  Lab 07/13/17 0915  NA 143  K 3.6  CL 107  CO2 18*  GLUCOSE 176*  BUN 41*  CREATININE 2.39*  CALCIUM 9.2   Liver Function Tests: Recent Labs  Lab 07/13/17 0915  AST 92*  ALT 57*  ALKPHOS 167*  BILITOT 0.8  PROT 6.7  ALBUMIN 3.7   No results for input(s): LIPASE, AMYLASE in the last 168 hours. No results for input(s): AMMONIA in the last 168 hours. CBC: Recent Labs  Lab 07/13/17 0915  WBC 9.2  NEUTROABS 8.7*  HGB 12.0  HCT 34.9*  MCV 88.6  PLT 107*   Cardiac Enzymes: No results for input(s): CKTOTAL, CKMB, CKMBINDEX, TROPONINI in the last 168 hours.  BNP (last 3 results) No results for input(s): BNP in the last 8760 hours.  ProBNP (last 3 results) No results for input(s): PROBNP in the last 8760 hours.  CBG: No results for input(s): GLUCAP in the last 168 hours.  Radiological Exams on Admission: Dg Chest Port 1 View  Result Date: 07/13/2017 CLINICAL DATA:  Confusion, fever and weakness. EXAM: PORTABLE CHEST 1 VIEW COMPARISON:  Chest CT 06/24/2014 FINDINGS: Enlarged cardiac silhouette. Calcific atherosclerotic disease of the aorta and tortuosity. There is no evidence of focal airspace consolidation, pleural effusion or pneumothorax. Low lung volumes. Probable  left pleural effusion. Osteolytic changes of the proximal left clavicle. Soft tissues are grossly normal. IMPRESSION: Probable left pleural effusion. Enlarged cardiac silhouette. Calcific atherosclerotic disease of the aorta. Osteolytic changes of the proximal left clavicle, at the site of previously demonstrated fracture. Please correlate clinically as osteomyelitis may have a similar appearance. Electronically Signed   By: Fidela Salisbury M.D.   On: 07/13/2017 10:06    EKG: Independently reviewed.  Assessment/Plan Active Problems:   Sepsis ([place])   84 y.o. female with PMH of HTN, CKD, Chronic pain syndrome, UTIs  Presented with fever and abdominal pain, nausea.    Possible sepsis. Unclear source at this time. Lactic acid-9.4. BP is stable. Presented with GI/abdominal symptoms+fevers, also history of recurrent UTIs.  -cont empiric antibiotic treatment, pend cultures, pend urine analysis, will obtain ct abdomen without contrast. Will f/u lactic acid, labs, adjust antibiotics as needed. Monitor in SDU tonight   Abdominal pains. Unclear etiology. Will obtain ct abdomen, unable to use contrast due to renal issues. Cont iv fluids, antibiotic  treatment, antiemetics. NPO. Monitor  AKI on CKDII-III.  Likely ATN. Also on losartan at home. Will cont iv fluids, monitor urine output, UA, fluid status, labs.   Chronic pain syndrome. Cont home regimen. Adjust fentanyl patch as needed. Monitor   Reported upper abdominal/chest pains. ECG: new rbbb, probable previous underlying MI. Will check stat Trop. Patient is not on Aspirin, will consider Aspirin,, home BB. Not on statin   HTN. BP is soft. Hold BP meds today. Resume BB AM. Hold losartan due to renal issues    Prognosis is guarded in elderly patient with sepsis, lactic acidosis. D/w patient, her family. Patient is DNR  None;  if consultant consulted, please document name and whether formally or informally consulted  Code Status: DNR (must  indicate code status--if unknown or must be presumed, indicate so) Family Communication: d/w patient, her family. RN (indicate person spoken with, if applicable, with phone number if by telephone) Disposition Plan: pend clinical improvement  (indicate anticipated LOS)  Time spent: >45 minutes   Kinnie Feil [facility] Pager [phone]  If 7PM-7AM, please contact night-coverage [email or web address] Password [facility] 07/13/2017, 11:33 AM

## 2017-07-14 ENCOUNTER — Encounter (HOSPITAL_COMMUNITY): Payer: Self-pay | Admitting: Urology

## 2017-07-14 ENCOUNTER — Inpatient Hospital Stay (HOSPITAL_COMMUNITY): Payer: Medicare Other

## 2017-07-14 DIAGNOSIS — N183 Chronic kidney disease, stage 3 unspecified: Secondary | ICD-10-CM | POA: Diagnosis present

## 2017-07-14 DIAGNOSIS — I361 Nonrheumatic tricuspid (valve) insufficiency: Secondary | ICD-10-CM

## 2017-07-14 DIAGNOSIS — R748 Abnormal levels of other serum enzymes: Secondary | ICD-10-CM

## 2017-07-14 DIAGNOSIS — A4151 Sepsis due to Escherichia coli [E. coli]: Secondary | ICD-10-CM | POA: Diagnosis present

## 2017-07-14 DIAGNOSIS — N10 Acute tubulo-interstitial nephritis: Secondary | ICD-10-CM | POA: Diagnosis present

## 2017-07-14 DIAGNOSIS — R778 Other specified abnormalities of plasma proteins: Secondary | ICD-10-CM | POA: Diagnosis present

## 2017-07-14 DIAGNOSIS — E039 Hypothyroidism, unspecified: Secondary | ICD-10-CM | POA: Diagnosis present

## 2017-07-14 DIAGNOSIS — N179 Acute kidney failure, unspecified: Secondary | ICD-10-CM | POA: Diagnosis present

## 2017-07-14 DIAGNOSIS — N201 Calculus of ureter: Secondary | ICD-10-CM | POA: Diagnosis present

## 2017-07-14 DIAGNOSIS — R7881 Bacteremia: Secondary | ICD-10-CM | POA: Diagnosis present

## 2017-07-14 DIAGNOSIS — I10 Essential (primary) hypertension: Secondary | ICD-10-CM

## 2017-07-14 DIAGNOSIS — R7989 Other specified abnormal findings of blood chemistry: Secondary | ICD-10-CM | POA: Diagnosis present

## 2017-07-14 LAB — BLOOD CULTURE ID PANEL (REFLEXED)
ACINETOBACTER BAUMANNII: NOT DETECTED
CANDIDA ALBICANS: NOT DETECTED
CANDIDA PARAPSILOSIS: NOT DETECTED
Candida glabrata: NOT DETECTED
Candida krusei: NOT DETECTED
Candida tropicalis: NOT DETECTED
Carbapenem resistance: NOT DETECTED
ENTEROBACTERIACEAE SPECIES: DETECTED — AB
Enterobacter cloacae complex: NOT DETECTED
Enterococcus species: NOT DETECTED
Escherichia coli: DETECTED — AB
Haemophilus influenzae: NOT DETECTED
KLEBSIELLA PNEUMONIAE: NOT DETECTED
Klebsiella oxytoca: NOT DETECTED
Listeria monocytogenes: NOT DETECTED
Neisseria meningitidis: NOT DETECTED
PSEUDOMONAS AERUGINOSA: NOT DETECTED
Proteus species: NOT DETECTED
STREPTOCOCCUS PYOGENES: NOT DETECTED
STREPTOCOCCUS SPECIES: NOT DETECTED
Serratia marcescens: NOT DETECTED
Staphylococcus aureus (BCID): NOT DETECTED
Staphylococcus species: NOT DETECTED
Streptococcus agalactiae: NOT DETECTED
Streptococcus pneumoniae: NOT DETECTED

## 2017-07-14 LAB — COMPREHENSIVE METABOLIC PANEL
ALT: 38 U/L (ref 14–54)
ANION GAP: 8 (ref 5–15)
AST: 55 U/L — ABNORMAL HIGH (ref 15–41)
Albumin: 2.7 g/dL — ABNORMAL LOW (ref 3.5–5.0)
Alkaline Phosphatase: 64 U/L (ref 38–126)
BUN: 52 mg/dL — ABNORMAL HIGH (ref 6–20)
CO2: 23 mmol/L (ref 22–32)
Calcium: 7.9 mg/dL — ABNORMAL LOW (ref 8.9–10.3)
Chloride: 110 mmol/L (ref 101–111)
Creatinine, Ser: 2.54 mg/dL — ABNORMAL HIGH (ref 0.44–1.00)
GFR calc non Af Amer: 15 mL/min — ABNORMAL LOW (ref >60)
GFR, EST AFRICAN AMERICAN: 18 mL/min — AB (ref >60)
Glucose, Bld: 120 mg/dL — ABNORMAL HIGH (ref 65–99)
Potassium: 4.2 mmol/L (ref 3.5–5.1)
SODIUM: 141 mmol/L (ref 135–145)
Total Bilirubin: 0.7 mg/dL (ref 0.3–1.2)
Total Protein: 5.3 g/dL — ABNORMAL LOW (ref 6.5–8.1)

## 2017-07-14 LAB — ECHOCARDIOGRAM COMPLETE
AOASC: 26 cm
CHL CUP TV REG PEAK VELOCITY: 395 cm/s
E decel time: 370 msec
FS: 25 % — AB (ref 28–44)
HEIGHTINCHES: 62 in
IV/PV OW: 0.7
LA diam end sys: 38 mm
LA diam index: 2.38 cm/m2
LA vol A4C: 66.7 ml
LA vol: 75.2 mL
LASIZE: 38 mm
LAVOLIN: 47.1 mL/m2
LVOT area: 2.54 cm2
LVOTD: 18 mm
MV Dec: 370
MV pk E vel: 66.8 m/s
MVPKAVEL: 36.5 m/s
PW: 12.4 mm — AB (ref 0.6–1.1)
RV sys press: 77 mmHg
TAPSE: 19.4 mm
TRMAXVEL: 395 cm/s
WEIGHTICAEL: 2038.81 [oz_av]

## 2017-07-14 LAB — TROPONIN I: Troponin I: 0.26 ng/mL (ref <0.03)

## 2017-07-14 MED ORDER — HYDRALAZINE HCL 25 MG PO TABS
25.0000 mg | ORAL_TABLET | Freq: Four times a day (QID) | ORAL | Status: DC
  Administered 2017-07-14 – 2017-07-18 (×15): 25 mg via ORAL
  Filled 2017-07-14 (×15): qty 1

## 2017-07-14 MED ORDER — DEXTROSE 5 % IV SOLN
2.0000 g | INTRAVENOUS | Status: DC
  Administered 2017-07-14 – 2017-07-18 (×5): 2 g via INTRAVENOUS
  Filled 2017-07-14 (×6): qty 2

## 2017-07-14 MED ORDER — HYDRALAZINE HCL 20 MG/ML IJ SOLN
10.0000 mg | INTRAMUSCULAR | Status: DC | PRN
  Administered 2017-07-14 – 2017-07-16 (×2): 10 mg via INTRAVENOUS
  Filled 2017-07-14 (×2): qty 1

## 2017-07-14 MED ORDER — ADULT MULTIVITAMIN W/MINERALS CH
1.0000 | ORAL_TABLET | Freq: Every day | ORAL | Status: DC
  Administered 2017-07-14 – 2017-07-19 (×6): 1 via ORAL
  Filled 2017-07-14 (×6): qty 1

## 2017-07-14 MED ORDER — ONDANSETRON HCL 4 MG/2ML IJ SOLN
4.0000 mg | Freq: Four times a day (QID) | INTRAMUSCULAR | Status: DC | PRN
  Administered 2017-07-14 – 2017-07-17 (×3): 4 mg via INTRAVENOUS
  Filled 2017-07-14 (×3): qty 2

## 2017-07-14 NOTE — Care Management Note (Signed)
Case Management Note  Patient Details  Name: Alexandra Alexander MRN: [id number] Date of Birth: Jun 14, 1921  Subjective/Objective:                  Sepsis   Action/Plan: Date: July 14, 2017 Velva Harman, BSN, [place], [place] [phone] Chart and notes review for patient progress and needs. Will follow for case management and discharge needs. No cm or discharge needs present at time of this review. Next review date: [id number]  Expected Discharge Date:                  Expected Discharge Plan:  Home/Self Care  In-House Referral:     Discharge planning Services  CM Consult  Post Acute Care Choice:    Choice offered to:     DME Arranged:    DME Agency:     HH Arranged:    HH Agency:     Status of Service:  In process, will continue to follow  If discussed at Long Length of Stay Meetings, dates discussed:    Additional Comments:  Leeroy Cha, RN 07/14/2017, 9:07 AM

## 2017-07-14 NOTE — Progress Notes (Signed)
  Echocardiogram 2D Echocardiogram has been performed.  Alexandra Alexander 07/14/2017, 3:50 PM

## 2017-07-14 NOTE — Progress Notes (Signed)
1 Day Post-Op  Subjective: Jameria is POD#1 from cystoscopy with left ureteral stenting for a 3mm distal stone with obstruction and sepsis.  She is doing well this morning without specific complaints.  Her Lactate is falling but the Cr is up some.  She is producing urine.    ROS:  Review of Systems  Constitutional: Negative for fever.  Respiratory: Negative for shortness of breath.   Cardiovascular: Negative for chest pain.  Gastrointestinal: Negative for nausea.  Genitourinary: Positive for flank pain.    Anti-infectives: Anti-infectives (From admission, onward)   Start     Dose/Rate Route Frequency Ordered Stop   07/13/17 2100  piperacillin-tazobactam (ZOSYN) IVPB 2.25 g     2.25 g 100 mL/hr over 30 Minutes Intravenous Every 8 hours 07/13/17 1520     07/13/17 1215  piperacillin-tazobactam (ZOSYN) IVPB 3.375 g     3.375 g 100 mL/hr over 30 Minutes Intravenous  Once 07/13/17 1210 07/13/17 1325   07/13/17 0930  levofloxacin (LEVAQUIN) IVPB 750 mg     750 mg 100 mL/hr over 90 Minutes Intravenous  Once 07/13/17 0925 07/13/17 1138   07/13/17 0930  aztreonam (AZACTAM) 2 g in dextrose 5 % 50 mL IVPB     2 g 100 mL/hr over 30 Minutes Intravenous  Once 07/13/17 0925 07/13/17 1059   07/13/17 0930  vancomycin (VANCOCIN) IVPB 1000 mg/200 mL premix  Status:  Discontinued     1,000 mg 200 mL/hr over 60 Minutes Intravenous  Once 07/13/17 1610 07/13/17 0926      Current Facility-Administered Medications  Medication Dose Route Frequency Provider Last Rate Last Dose  . 0.9 %  sodium chloride infusion  1,000 mL Intravenous Continuous Lacretia Leigh, MD 125 mL/hr at 07/13/17 2019 1,000 mL at 07/13/17 2019  . aspirin tablet 325 mg  325 mg Oral Daily Kinnie Feil, MD   325 mg at 07/13/17 2017  . escitalopram (LEXAPRO) tablet 10 mg  10 mg Oral Daily Buriev, Arie Sabina, MD      . fentaNYL (DURAGESIC - dosed mcg/hr) patch 25 mcg  25 mcg Transdermal Q72H Kinnie Feil, MD   25 mcg at 07/13/17  2011  . heparin injection 5,000 Units  5,000 Units Subcutaneous Q8H Kinnie Feil, MD   5,000 Units at 07/14/17 0506  . levothyroxine (SYNTHROID, LEVOTHROID) tablet 75 mcg  75 mcg Oral QAC breakfast Buriev, Arie Sabina, MD      . lubiprostone (AMITIZA) capsule 8 mcg  8 mcg Oral Daily Buriev, Arie Sabina, MD      . multivitamin with minerals tablet 1 tablet  1 tablet Oral Daily Vance Gather B, MD      . nebivolol (BYSTOLIC) tablet 5 mg  5 mg Oral Daily Buriev, Arie Sabina, MD      . piperacillin-tazobactam (ZOSYN) IVPB 2.25 g  2.25 g Intravenous Q8H Minda Ditto, RPH   Stopped at 07/14/17 0540  . polyethylene glycol (MIRALAX / GLYCOLAX) packet 17 g  17 g Oral Daily PRN Kinnie Feil, MD         Objective: Vital signs in last 24 hours: Temp:  [97.9 F (36.6 C)-101.9 F (38.8 C)] 99 F (37.2 C) (02/04 0316) Pulse Rate:  [57-101] 75 (02/04 0700) Resp:  [17-30] 18 (02/04 0700) BP: (83-177)/(37-86) 177/70 (02/04 0700) SpO2:  [93 %-100 %] 95 % (02/04 0700) Weight:  [56.7 kg (125 lb)-57.8 kg (127 lb 6.8 oz)] 57.8 kg (127 lb 6.8 oz) (02/03 2000)  Intake/Output from previous  day: 02/03 0701 - 02/04 0700 In: 4240.4 [P.O.:280; I.V.:1910.4; IV Piggyback:2050] Out: 470 [Urine:470] Intake/Output this shift: No intake/output data recorded.   Physical Exam  Constitutional:  Elderly, cachectic WF in NAD  Cardiovascular: An irregularly irregular rhythm present. Frequent extrasystoles are present.  Pulmonary/Chest: Effort normal. No respiratory distress.  Abdominal: Soft. There is tenderness (mild LUQ).  Genitourinary:  Genitourinary Comments: Urine minimally bloody in tube.   Musculoskeletal: Normal range of motion. She exhibits no edema or tenderness.  Neurological: She is alert.  Skin: Skin is warm and dry.    Lab Results:  Recent Labs    07/13/17 0915 07/14/17 0004  WBC 9.2 25.0*  HGB 12.0 9.3*  HCT 34.9* 26.6*  PLT 107* 132*   BMET Recent Labs    07/13/17 0915  07/14/17 0154  NA 143 141  K 3.6 4.2  CL 107 110  CO2 18* 23  GLUCOSE 176* 120*  BUN 41* 52*  CREATININE 2.39* 2.54*  CALCIUM 9.2 7.9*   PT/INR No results for input(s): LABPROT, INR in the last 72 hours. ABG No results for input(s): PHART, HCO3 in the last 72 hours.  Invalid input(s): PCO2, PO2  Studies/Results: Ct Abdomen Pelvis Wo Contrast  Result Date: 07/13/2017 CLINICAL DATA:  Fever, abdominal pain, nausea EXAM: CT ABDOMEN AND PELVIS WITHOUT CONTRAST TECHNIQUE: Multidetector CT imaging of the abdomen and pelvis was performed following the standard protocol without IV contrast. COMPARISON:  None. FINDINGS: Motion degraded images. Evaluation of the upper abdomen is also limited due to streak artifact from the patient's arms. Lower chest: Lung bases are clear. Hepatobiliary: Unenhanced liver is grossly unremarkable. Gallbladder is grossly unremarkable. No intrahepatic or extrahepatic ductal dilatation. Pancreas: Grossly unremarkable. Spleen: Within normal limits. Adrenals/Urinary Tract: Adrenal glands are grossly unremarkable. Right kidney is within normal limits. Left kidney is poorly evaluated due to motion degradation but mild left hydronephrosis with perinephric fluid/stranding is suspected. Associated 4 mm distal left ureteral calculus (series 2/image 58). Bladder is within normal limits. Stomach/Bowel: Stomach is grossly unremarkable. No evidence of bowel obstruction. Appendix is not discretely visualized. Vascular/Lymphatic: No evidence of abdominal aortic aneurysm. Atherosclerotic calcifications of the abdominal aorta and branch vessels. No suspicious abdominopelvic lymphadenopathy. Reproductive: Status post hysterectomy. No adnexal masses. Other: No abdominopelvic ascites. Musculoskeletal: Degenerative changes of the visualized thoracolumbar spine. Lumbar levoscoliosis. IMPRESSION: Limited evaluation due to motion degradation and streak artifact. 4 mm distal left ureteral calculus.  Suspected associated mild left hydronephrosis. Electronically Signed   By: Julian Hy M.D.   On: 07/13/2017 12:17   Dg Chest Port 1 View  Result Date: 07/13/2017 CLINICAL DATA:  Confusion, fever and weakness. EXAM: PORTABLE CHEST 1 VIEW COMPARISON:  Chest CT 06/24/2014 FINDINGS: Enlarged cardiac silhouette. Calcific atherosclerotic disease of the aorta and tortuosity. There is no evidence of focal airspace consolidation, pleural effusion or pneumothorax. Low lung volumes. Probable left pleural effusion. Osteolytic changes of the proximal left clavicle. Soft tissues are grossly normal. IMPRESSION: Probable left pleural effusion. Enlarged cardiac silhouette. Calcific atherosclerotic disease of the aorta. Osteolytic changes of the proximal left clavicle, at the site of previously demonstrated fracture. Please correlate clinically as osteomyelitis may have a similar appearance. Electronically Signed   By: Fidela Salisbury M.D.   On: 07/13/2017 10:06   Dg C-arm 1-60 Min-no Report  Result Date: 07/13/2017 Fluoroscopy was utilized by the requesting physician.  No radiographic interpretation.   Labs reviewed.   Assessment and Plan: Left distal stone with sepsis and AKI.    Continue  foley drainage for now to better monitor UOP but could change to purewick system in a day or so depending on her progress.   I will arrange ureteroscopy for removal of the stone sometime in the next couple of weeks.         LOS: 1 day    Irine Seal 07/14/2017 [phone] ID: Jessee Avers, female   DOB: 1921-10-02, 84 y.o.   MRN: [id number]

## 2017-07-14 NOTE — Progress Notes (Signed)
PROGRESS NOTE  [name]  DXI:[id number] DOB: [birth year] DOA: 07/13/2017 PCP: [name], MD   Brief Narrative: Alexandra Alexander is a 84 y.o. female with a history of HTN, stage III CKD, chronic pain syndrome and UTI's who presented 2/3 with fever and abdominal pain found to be septic. Lactic acid was 9, Creatinine 2.3. Vancomycin and zosyn were administered and the patient admitted to the SDU. CT abdomen showed evidence of left pyelonephritis, nephrolithiasis and hydronephrosis and the patient underwent ureteral stenting 2/3. Troponins returned elevated at 0.34-0.35, trending downward in the absence of chest pain that, after discussion with cardiology, was felt to indicate demand ischemia. Blood cultures have grown E. coli, antibiotics narrowed to ceftriaxone.   Assessment & Plan: Principal Problem:   Sepsis ([place]) Active Problems:   Hypertension   AKI (acute kidney injury) ([place])   Stage 3 chronic kidney disease ([place])   Acute pyelonephritis   Left ureteral stone   Elevated troponin   Hypothyroidism  Sepsis due to E. coli bacteremia and left pyelonephritis: Lactic acid trending downward.  - Vanc/zosyn transitioned to ceftriaxone, follow up final culture data. - Continue IV fluids until taking reliable po.  - Will repeat blood cultures in AM.  Left obstructive nephrolithiasis: s/p stenting by Dr. Jeffie Pollock 2/3.  - Monitor renal function - Continue foley per urology, plan to transition to [facility] system.   AKI on CKD stage III: Due to obstructive uropathy and sepsis. SCr 2.39 on admission, up from baseline of 1-1.3.  - Monitor creatinine, maintaining urine output.  - Hold ARB  Chronic pain syndrome:  - Continue home medications  Troponin elevation: Most consistent with demand ischemia in setting of sepsis and possible ischemic event in the past.  - Started ASA, continue beta blocker (renally dosing for now). Not on statin.  - Frequent PACs/PVCs, will continue telemetry.    HTN: Holding BP meds with sepsis.   Hypothyroidism: TSH 2.012 - Continue synthroid  DVT prophylaxis: Heparin Code Status: DNR Family Communication: Daughter and SIL at bedside Disposition Plan: Transfer to floor, therapy evaluation ordered.   Consultants:   Cardiology, Dr. Jeffie Pollock by phone 2/3.   Procedures:   Echocardiogram ordered.   Antimicrobials:  Aztreonam, levaquin, vancomycin 2/3  Zosyn 2/3 - 2/4  Ceftriaxone 2/4 >>    Subjective: Tired and weak but feeling generally a lot better than yesterday. No chest pain or dyspnea or palpitations. No fever today. Ate some breakfast without nausea or vomiting, but still with poor appetite.   Objective: Vitals:   07/14/17 0700 07/14/17 0800 07/14/17 1000 07/14/17 1200  BP: (!) 177/70 (!) 154/72 (!) 167/70   Pulse: 75 90 75   Resp: 18 (!) 26 (!) 22   Temp:  97.8 F (36.6 C)  98.2 F (36.8 C)  TempSrc:  Oral  Oral  SpO2: 95% 96% 94%   Weight:      Height:        Intake/Output Summary (Last 24 hours) at 07/14/2017 1313 Last data filed at 07/14/2017 0900 Gross per 24 hour  Intake 2715.42 ml  Output 470 ml  Net 2245.42 ml   Filed Weights   07/13/17 0920 07/13/17 2000  Weight: 56.7 kg (125 lb) 57.8 kg (127 lb 6.8 oz)    Gen: Frail, elderly female in no distress Pulm: Non-labored breathing room air. Clear to auscultation bilaterally.  CV: Irregular with extrasystolic beats frequently. No murmur, rub, or gallop. No JVD. GI: Abdomen soft, non-tender, non-distended, with normoactive bowel sounds. No  organomegaly or masses felt. Ext: Warm, Left BKA site normal.  Skin: No rashes, lesions or ulcers Neuro: Alert and oriented. No focal neurological deficits. Psych: Judgement and insight appear normal. Mood & affect appropriate.   Data Reviewed: I have personally reviewed following labs and imaging studies  CBC: Recent Labs  Lab 07/13/17 0915 07/14/17 0004  WBC 9.2 25.0*  NEUTROABS 8.7* 22.4*  HGB 12.0 9.3*   HCT 34.9* 26.6*  MCV 88.6 88.4  PLT 107* 297*   Basic Metabolic Panel: Recent Labs  Lab 07/13/17 0915 07/14/17 0154  NA 143 141  K 3.6 4.2  CL 107 110  CO2 18* 23  GLUCOSE 176* 120*  BUN 41* 52*  CREATININE 2.39* 2.54*  CALCIUM 9.2 7.9*   GFR: Estimated Creatinine Clearance: 10.9 mL/min (A) (by C-G formula based on SCr of 2.54 mg/dL (H)). Liver Function Tests: Recent Labs  Lab 07/13/17 0915 07/14/17 0154  AST 92* 55*  ALT 57* 38  ALKPHOS 167* 64  BILITOT 0.8 0.7  PROT 6.7 5.3*  ALBUMIN 3.7 2.7*   No results for input(s): LIPASE, AMYLASE in the last 168 hours. No results for input(s): AMMONIA in the last 168 hours. Coagulation Profile: No results for input(s): INR, PROTIME in the last 168 hours. Cardiac Enzymes: Recent Labs  Lab 07/13/17 1225 07/13/17 1833 07/14/17 0004  TROPONINI 0.34* 0.35* 0.26*   BNP (last 3 results) No results for input(s): PROBNP in the last 8760 hours. HbA1C: No results for input(s): HGBA1C in the last 72 hours. CBG: No results for input(s): GLUCAP in the last 168 hours. Lipid Profile: No results for input(s): CHOL, HDL, LDLCALC, TRIG, CHOLHDL, LDLDIRECT in the last 72 hours. Thyroid Function Tests: Recent Labs    07/13/17 1833  TSH 2.012   Anemia Panel: No results for input(s): VITAMINB12, FOLATE, FERRITIN, TIBC, IRON, RETICCTPCT in the last 72 hours. Urine analysis:    Component Value Date/Time   COLORURINE YELLOW 07/13/2017 1140   APPEARANCEUR CLOUDY (A) 07/13/2017 1140   LABSPEC 1.012 07/13/2017 1140   PHURINE 5.0 07/13/2017 1140   GLUCOSEU NEGATIVE 07/13/2017 1140   HGBUR LARGE (A) 07/13/2017 1140   BILIRUBINUR NEGATIVE 07/13/2017 1140   KETONESUR NEGATIVE 07/13/2017 1140   PROTEINUR 30 (A) 07/13/2017 1140   NITRITE POSITIVE (A) 07/13/2017 1140   LEUKOCYTESUR MODERATE (A) 07/13/2017 1140   Recent Results (from the past 240 hour(s))  Blood Culture (routine x 2)     Status: None (Preliminary result)   Collection  Time: 07/13/17  9:20 AM  Result Value Ref Range Status   Specimen Description BLOOD RIGHT ARM  Final   Special Requests   Final    BOTTLES DRAWN AEROBIC AND ANAEROBIC Blood Culture adequate volume   Culture  Setup Time   Final    IN BOTH AEROBIC AND ANAEROBIC BOTTLES GRAM NEGATIVE RODS Performed at [facility], [place] [address], [place], [place] [address]    Culture PENDING  Incomplete   Report Status PENDING  Incomplete  Blood Culture (routine x 2)     Status: None (Preliminary result)   Collection Time: 07/13/17 10:15 AM  Result Value Ref Range Status   Specimen Description BLOOD RIGHT ARM  Final   Special Requests   Final    BOTTLES DRAWN AEROBIC AND ANAEROBIC Blood Culture adequate volume   Culture  Setup Time   Final    IN BOTH AEROBIC AND ANAEROBIC BOTTLES GRAM NEGATIVE RODS Organism ID to follow CRITICAL RESULT CALLED TO, READ  BACK BY AND VERIFIED WITH: JGRIMSLEY(PHARMD) BY [facility] 07/14/2017 AT 5:37AM Performed at [facility], [place] [address], [place], [place] [address]    Culture PENDING  Incomplete   Report Status PENDING  Incomplete  Blood Culture ID Panel (Reflexed)     Status: Abnormal   Collection Time: 07/13/17 10:15 AM  Result Value Ref Range Status   Enterococcus species NOT DETECTED NOT DETECTED Final   Listeria monocytogenes NOT DETECTED NOT DETECTED Final   Staphylococcus species NOT DETECTED NOT DETECTED Final   Staphylococcus aureus NOT DETECTED NOT DETECTED Final   Streptococcus species NOT DETECTED NOT DETECTED Final   Streptococcus agalactiae NOT DETECTED NOT DETECTED Final   Streptococcus pneumoniae NOT DETECTED NOT DETECTED Final   Streptococcus pyogenes NOT DETECTED NOT DETECTED Final   Acinetobacter baumannii NOT DETECTED NOT DETECTED Final   Enterobacteriaceae species DETECTED (A) NOT DETECTED Final    Comment: Enterobacteriaceae represent a large family of gram-negative bacteria, not a single organism. CRITICAL RESULT CALLED TO, READ  BACK BY AND VERIFIED WITH: JGRIMSLEY(PHARMD) BY [facility] 07/14/2017 AT 5:37AM    Enterobacter cloacae complex NOT DETECTED NOT DETECTED Final   Escherichia coli DETECTED (A) NOT DETECTED Final    Comment: CRITICAL RESULT CALLED TO, READ BACK BY AND VERIFIED WITH: JGRIMSLEY(PHARMD) BY [facility] 07/14/2017 AT 5:37AM    Klebsiella oxytoca NOT DETECTED NOT DETECTED Final   Klebsiella pneumoniae NOT DETECTED NOT DETECTED Final   Proteus species NOT DETECTED NOT DETECTED Final   Serratia marcescens NOT DETECTED NOT DETECTED Final   Carbapenem resistance NOT DETECTED NOT DETECTED Final   Haemophilus influenzae NOT DETECTED NOT DETECTED Final   Neisseria meningitidis NOT DETECTED NOT DETECTED Final   Pseudomonas aeruginosa NOT DETECTED NOT DETECTED Final   Candida albicans NOT DETECTED NOT DETECTED Final   Candida glabrata NOT DETECTED NOT DETECTED Final   Candida krusei NOT DETECTED NOT DETECTED Final   Candida parapsilosis NOT DETECTED NOT DETECTED Final   Candida tropicalis NOT DETECTED NOT DETECTED Final    Comment: Performed at [facility], [place] [address], [place], [place] [address]  MRSA PCR Screening     Status: None   Collection Time: 07/13/17  5:00 PM  Result Value Ref Range Status   MRSA by PCR NEGATIVE NEGATIVE Final    Comment:        The GeneXpert MRSA Assay (FDA approved for NASAL specimens only), is one component of a comprehensive MRSA colonization surveillance program. It is not intended to diagnose MRSA infection nor to guide or monitor treatment for MRSA infections. Performed at [facility], [place] [address], [place], [place] [address]       Radiology Studies: Ct Abdomen Pelvis Wo Contrast  Result Date: 07/13/2017 CLINICAL DATA:  Fever, abdominal pain, nausea EXAM: CT ABDOMEN AND PELVIS WITHOUT CONTRAST TECHNIQUE: Multidetector CT imaging of the abdomen and pelvis was performed following the standard protocol without IV contrast.  COMPARISON:  None. FINDINGS: Motion degraded images. Evaluation of the upper abdomen is also limited due to streak artifact from the patient's arms. Lower chest: Lung bases are clear. Hepatobiliary: Unenhanced liver is grossly unremarkable. Gallbladder is grossly unremarkable. No intrahepatic or extrahepatic ductal dilatation. Pancreas: Grossly unremarkable. Spleen: Within normal limits. Adrenals/Urinary Tract: Adrenal glands are grossly unremarkable. Right kidney is within normal limits. Left kidney is poorly evaluated due to motion degradation but mild left hydronephrosis with perinephric fluid/stranding is suspected. Associated 4 mm distal left ureteral calculus (series 2/image 58). Bladder is within  normal limits. Stomach/Bowel: Stomach is grossly unremarkable. No evidence of bowel obstruction. Appendix is not discretely visualized. Vascular/Lymphatic: No evidence of abdominal aortic aneurysm. Atherosclerotic calcifications of the abdominal aorta and branch vessels. No suspicious abdominopelvic lymphadenopathy. Reproductive: Status post hysterectomy. No adnexal masses. Other: No abdominopelvic ascites. Musculoskeletal: Degenerative changes of the visualized thoracolumbar spine. Lumbar levoscoliosis. IMPRESSION: Limited evaluation due to motion degradation and streak artifact. 4 mm distal left ureteral calculus. Suspected associated mild left hydronephrosis. Electronically Signed   By: Julian Hy M.D.   On: 07/13/2017 12:17   Dg Chest Port 1 View  Result Date: 07/13/2017 CLINICAL DATA:  Confusion, fever and weakness. EXAM: PORTABLE CHEST 1 VIEW COMPARISON:  Chest CT 06/24/2014 FINDINGS: Enlarged cardiac silhouette. Calcific atherosclerotic disease of the aorta and tortuosity. There is no evidence of focal airspace consolidation, pleural effusion or pneumothorax. Low lung volumes. Probable left pleural effusion. Osteolytic changes of the proximal left clavicle. Soft tissues are grossly normal.  IMPRESSION: Probable left pleural effusion. Enlarged cardiac silhouette. Calcific atherosclerotic disease of the aorta. Osteolytic changes of the proximal left clavicle, at the site of previously demonstrated fracture. Please correlate clinically as osteomyelitis may have a similar appearance. Electronically Signed   By: Fidela Salisbury M.D.   On: 07/13/2017 10:06   Dg C-arm 1-60 Min-no Report  Result Date: 07/13/2017 Fluoroscopy was utilized by the requesting physician.  No radiographic interpretation.    Scheduled Meds: . aspirin  325 mg Oral Daily  . escitalopram  10 mg Oral Daily  . fentaNYL  25 mcg Transdermal Q72H  . heparin  5,000 Units Subcutaneous Q8H  . levothyroxine  75 mcg Oral QAC breakfast  . lubiprostone  8 mcg Oral Daily  . multivitamin with minerals  1 tablet Oral Daily  . nebivolol  5 mg Oral Daily   Continuous Infusions: . sodium chloride 1,000 mL (07/13/17 2019)  . cefTRIAXone (ROCEPHIN)  IV       LOS: 1 day   Time spent: 35 minutes.  Vance Gather, MD [facility] Pager [phone]  If 7PM-7AM, please contact night-coverage [email or web address] Password [email or web address] 07/14/2017, 1:13 PM

## 2017-07-14 NOTE — Progress Notes (Signed)
PHARMACY - PHYSICIAN COMMUNICATION CRITICAL VALUE ALERT - BLOOD CULTURE IDENTIFICATION (BCID)  Alexandra Alexander is an 84 y.o. female who presented to [facility] on 07/13/2017 with a chief complaint of Sepsis  Assessment:  Patient with sepsis/UTI and in ICU (include suspected source if known)  Name of physician (or Provider) Contacted: [place]  Current antibiotics: zosyn 2.25gm iv q6hr  Changes to prescribed antibiotics recommended:  Since patient is still in ICU, recommend for rounding team to consider if change to ceftriaxone 2gm iv q24hr for E. Coli bacteremia is okay for patient. Recommendations accepted by provider  Results for orders placed or performed during the hospital encounter of 07/13/17  Blood Culture ID Panel (Reflexed) (Collected: 07/13/2017 10:15 AM)  Result Value Ref Range   Enterococcus species NOT DETECTED NOT DETECTED   Listeria monocytogenes NOT DETECTED NOT DETECTED   Staphylococcus species NOT DETECTED NOT DETECTED   Staphylococcus aureus NOT DETECTED NOT DETECTED   Streptococcus species NOT DETECTED NOT DETECTED   Streptococcus agalactiae NOT DETECTED NOT DETECTED   Streptococcus pneumoniae NOT DETECTED NOT DETECTED   Streptococcus pyogenes NOT DETECTED NOT DETECTED   Acinetobacter baumannii NOT DETECTED NOT DETECTED   Enterobacteriaceae species DETECTED (A) NOT DETECTED   Enterobacter cloacae complex NOT DETECTED NOT DETECTED   Escherichia coli DETECTED (A) NOT DETECTED   Klebsiella oxytoca NOT DETECTED NOT DETECTED   Klebsiella pneumoniae NOT DETECTED NOT DETECTED   Proteus species NOT DETECTED NOT DETECTED   Serratia marcescens NOT DETECTED NOT DETECTED   Carbapenem resistance NOT DETECTED NOT DETECTED   Haemophilus influenzae NOT DETECTED NOT DETECTED   Neisseria meningitidis NOT DETECTED NOT DETECTED   Pseudomonas aeruginosa NOT DETECTED NOT DETECTED   Candida albicans NOT DETECTED NOT DETECTED   Candida glabrata NOT DETECTED NOT DETECTED   Candida krusei NOT DETECTED NOT DETECTED   Candida parapsilosis NOT DETECTED NOT DETECTED   Candida tropicalis NOT DETECTED NOT DETECTED    [name] 07/14/2017  6:29 AM

## 2017-07-15 DIAGNOSIS — A419 Sepsis, unspecified organism: Secondary | ICD-10-CM

## 2017-07-15 DIAGNOSIS — R7881 Bacteremia: Secondary | ICD-10-CM

## 2017-07-15 LAB — CBC WITH DIFFERENTIAL/PLATELET
BASOS PCT: 0 %
Basophils Absolute: 0 10*3/uL (ref 0.0–0.1)
EOS ABS: 0 10*3/uL (ref 0.0–0.7)
Eosinophils Relative: 0 %
HCT: 26.6 % — ABNORMAL LOW (ref 36.0–46.0)
Hemoglobin: 9.3 g/dL — ABNORMAL LOW (ref 12.0–15.0)
Lymphocytes Relative: 5 %
Lymphs Abs: 1.3 10*3/uL (ref 0.7–4.0)
MCH: 30.9 pg (ref 26.0–34.0)
MCHC: 35 g/dL (ref 30.0–36.0)
MCV: 88.4 fL (ref 78.0–100.0)
MONO ABS: 1.3 10*3/uL — AB (ref 0.1–1.0)
Monocytes Relative: 5 %
NEUTROS PCT: 90 %
Neutro Abs: 22.4 10*3/uL — ABNORMAL HIGH (ref 1.7–7.7)
PLATELETS: 132 10*3/uL — AB (ref 150–400)
RBC: 3.01 MIL/uL — ABNORMAL LOW (ref 3.87–5.11)
RDW: 14.9 % (ref 11.5–15.5)
WBC: 25 10*3/uL — ABNORMAL HIGH (ref 4.0–10.5)

## 2017-07-15 LAB — LACTIC ACID, PLASMA: Lactic Acid, Venous: 1.5 mmol/L (ref 0.5–1.9)

## 2017-07-15 LAB — BASIC METABOLIC PANEL
ANION GAP: 8 (ref 5–15)
BUN: 41 mg/dL — ABNORMAL HIGH (ref 6–20)
CO2: 23 mmol/L (ref 22–32)
CREATININE: 1.74 mg/dL — AB (ref 0.44–1.00)
Calcium: 8.2 mg/dL — ABNORMAL LOW (ref 8.9–10.3)
Chloride: 113 mmol/L — ABNORMAL HIGH (ref 101–111)
GFR calc Af Amer: 28 mL/min — ABNORMAL LOW (ref >60)
GFR calc non Af Amer: 24 mL/min — ABNORMAL LOW (ref >60)
GLUCOSE: 101 mg/dL — AB (ref 65–99)
Potassium: 3.6 mmol/L (ref 3.5–5.1)
Sodium: 144 mmol/L (ref 135–145)

## 2017-07-15 LAB — URINE CULTURE

## 2017-07-15 NOTE — Progress Notes (Signed)
PROGRESS NOTE  Alexandra Alexander  HMC:[id number] DOB: 20-Mar-1922 DOA: 07/13/2017 PCP: [name], MD   Brief Narrative: Alexandra Alexander is a 84 y.o. female with a history of HTN, stage III CKD, chronic pain syndrome and UTI's who presented 2/3 with fever and abdominal pain found to be septic. Lactic acid was 9, Creatinine 2.3. Vancomycin and zosyn were administered and the patient admitted to the SDU. CT abdomen showed evidence of left pyelonephritis, nephrolithiasis and hydronephrosis and the patient underwent ureteral stenting 2/3. Troponins returned elevated at 0.34-0.35, trending downward in the absence of chest pain that, after discussion with cardiology, was felt to indicate demand ischemia. Blood cultures have grown E. coli, antibiotics narrowed to ceftriaxone. Repeat blood cultures drawn 2/5. Pt has defervesced.   Assessment & Plan: Principal Problem:   Sepsis due to Escherichia coli (E. coli) (HCC) Active Problems:   Hypertension   AKI (acute kidney injury) ([place])   Stage 3 chronic kidney disease (HCC)   Acute pyelonephritis   Left ureteral stone   Elevated troponin   Hypothyroidism   E coli bacteremia  Sepsis due to E. coli bacteremia and left pyelonephritis: Lactic acid trending downward.  - Vanc/zosyn transitioned to ceftriaxone, follow up final culture data. Awaiting susceptibilities. - Can DC fluids, taking po. - Repeat blood cultures 2/5.   Left obstructive nephrolithiasis: s/p stenting by Dr. Jeffie Pollock 2/3.  - Monitor renal function - Continued foley per urology, plan to transition to [facility] system. Defer to urology Re: removal.   AKI on CKD stage III: Due to obstructive uropathy and sepsis. SCr 2.39 on admission, up from baseline of 1-1.3. Improving.  - Monitor creatinine, maintaining urine output.  - Holding ARB  Chronic pain syndrome:  - Continue home medications  Troponin elevation: Most consistent with demand ischemia in setting of sepsis and possible  ischemic event in the past. No wall motion abnormalities on echo, EF preserved, no diastolic dysfunction noted and no vegetations noted. - Started ASA, continue beta blocker. Not on statin.  - Frequent PACs/PVCs, will continue telemetry.   HTN: Holding BP meds with sepsis.   Hypothyroidism: TSH 2.012 - Continue synthroid  DVT prophylaxis: Heparin Code Status: DNR Family Communication: Daughter and SIL at bedside Disposition Plan: Planning discharge to SNF. CSW consulted.  Consultants:   Cardiology, Dr. Jeffie Pollock by phone 2/3.   Procedures:   Echocardiogram 07/14/2017: - Left ventricle: The cavity size was normal. Wall thickness was   normal. Systolic function was normal. The estimated ejection   fraction was in the range of 55% to 60%. Wall motion was normal;   there were no regional wall motion abnormalities.  - Mitral valve: There was mild regurgitation. - Left atrium: The atrium was moderately to severely dilated. - Right ventricle: The cavity size was mildly dilated. - Right atrium: The atrium was moderately dilated. - Tricuspid valve: There was moderate regurgitation. - Pulmonary arteries: Systolic pressure was severely increased. PA   peak pressure: 77 mm Hg (S).  Antimicrobials:  Aztreonam, levaquin, vancomycin 2/3  Zosyn 2/3 - 2/4  Ceftriaxone 2/4 >>    Subjective: Walked with PT, will need rehabilitation. Did not sleep well last night, sleeping today. Eating, no fever.   Objective: Vitals:   07/15/17 0009 07/15/17 0546 07/15/17 1301 07/15/17 1403  BP: 123/60 (!) 150/77 135/78 136/69  Pulse: 70 (!) 116 65 66  Resp:  20 19 18   Temp:  98.2 F (36.8 C) 97.8 F (36.6 C) 98.4 F (36.9 C)  TempSrc:  Oral Oral Oral  SpO2:  100% 95% 94%  Weight:      Height:        Intake/Output Summary (Last 24 hours) at 07/15/2017 1451 Last data filed at 07/15/2017 1304 Gross per 24 hour  Intake 470 ml  Output 1651 ml  Net -1181 ml   Filed Weights   07/13/17 0920  07/13/17 2000  Weight: 56.7 kg (125 lb) 57.8 kg (127 lb 6.8 oz)    Gen: Frail, elderly female in no distress sitting on bedside chair. Pulm: Non-labored breathing room air. Clear to auscultation bilaterally.  CV: Irregular with extrasystolic beats frequently, rate normal. No murmur, rub, or gallop. No JVD. GI: Abdomen soft, non-tender, non-distended, with normoactive bowel sounds. No organomegaly or masses felt. Ext: Warm, Left BKA site normal.  Skin: No rashes, lesions or ulcers Neuro: Alert and oriented. No focal neurological deficits. Psych: Judgement and insight appear normal. Mood & affect appropriate.   Data Reviewed: I have personally reviewed following labs and imaging studies  CBC: Recent Labs  Lab 07/13/17 0915 07/14/17 0004  WBC 9.2 25.0*  NEUTROABS 8.7* 22.4*  HGB 12.0 9.3*  HCT 34.9* 26.6*  MCV 88.6 88.4  PLT 107* 938*   Basic Metabolic Panel: Recent Labs  Lab 07/13/17 0915 07/14/17 0154 07/15/17 0445  NA 143 141 144  K 3.6 4.2 3.6  CL 107 110 113*  CO2 18* 23 23  GLUCOSE 176* 120* 101*  BUN 41* 52* 41*  CREATININE 2.39* 2.54* 1.74*  CALCIUM 9.2 7.9* 8.2*   GFR: Estimated Creatinine Clearance: 16 mL/min (A) (by C-G formula based on SCr of 1.74 mg/dL (H)). Liver Function Tests: Recent Labs  Lab 07/13/17 0915 07/14/17 0154  AST 92* 55*  ALT 57* 38  ALKPHOS 167* 64  BILITOT 0.8 0.7  PROT 6.7 5.3*  ALBUMIN 3.7 2.7*   No results for input(s): LIPASE, AMYLASE in the last 168 hours. No results for input(s): AMMONIA in the last 168 hours. Coagulation Profile: No results for input(s): INR, PROTIME in the last 168 hours. Cardiac Enzymes: Recent Labs  Lab 07/13/17 1225 07/13/17 1833 07/14/17 0004  TROPONINI 0.34* 0.35* 0.26*   BNP (last 3 results) No results for input(s): PROBNP in the last 8760 hours. HbA1C: No results for input(s): HGBA1C in the last 72 hours. CBG: No results for input(s): GLUCAP in the last 168 hours. Lipid Profile: No  results for input(s): CHOL, HDL, LDLCALC, TRIG, CHOLHDL, LDLDIRECT in the last 72 hours. Thyroid Function Tests: Recent Labs    07/13/17 1833  TSH 2.012   Anemia Panel: No results for input(s): VITAMINB12, FOLATE, FERRITIN, TIBC, IRON, RETICCTPCT in the last 72 hours. Urine analysis:    Component Value Date/Time   COLORURINE YELLOW 07/13/2017 1140   APPEARANCEUR CLOUDY (A) 07/13/2017 1140   LABSPEC 1.012 07/13/2017 1140   PHURINE 5.0 07/13/2017 1140   GLUCOSEU NEGATIVE 07/13/2017 1140   HGBUR LARGE (A) 07/13/2017 1140   BILIRUBINUR NEGATIVE 07/13/2017 1140   KETONESUR NEGATIVE 07/13/2017 1140   PROTEINUR 30 (A) 07/13/2017 1140   NITRITE POSITIVE (A) 07/13/2017 1140   LEUKOCYTESUR MODERATE (A) 07/13/2017 1140   Recent Results (from the past 240 hour(s))  Blood Culture (routine x 2)     Status: Abnormal (Preliminary result)   Collection Time: 07/13/17  9:20 AM  Result Value Ref Range Status   Specimen Description BLOOD RIGHT ARM  Final   Special Requests   Final    BOTTLES DRAWN AEROBIC AND ANAEROBIC Blood Culture  adequate volume   Culture  Setup Time   Final    IN BOTH AEROBIC AND ANAEROBIC BOTTLES GRAM NEGATIVE RODS CRITICAL VALUE NOTED.  VALUE IS CONSISTENT WITH PREVIOUSLY REPORTED AND CALLED VALUE. Performed at [facility], [place] [address], [place], [place] [address]    Culture ESCHERICHIA COLI (A)  Final   Report Status PENDING  Incomplete  Blood Culture (routine x 2)     Status: Abnormal (Preliminary result)   Collection Time: 07/13/17 10:15 AM  Result Value Ref Range Status   Specimen Description BLOOD RIGHT ARM  Final   Special Requests   Final    BOTTLES DRAWN AEROBIC AND ANAEROBIC Blood Culture adequate volume   Culture  Setup Time   Final    IN BOTH AEROBIC AND ANAEROBIC BOTTLES GRAM NEGATIVE RODS CRITICAL RESULT CALLED TO, READ BACK BY AND VERIFIED WITH: JGRIMSLEY(PHARMD) BY TCLEVELAND 07/14/2017 AT 5:37AM    Culture (A)  Final    ESCHERICHIA  COLI SUSCEPTIBILITIES TO FOLLOW Performed at [facility], [place] [address], [place], [place] [address]    Report Status PENDING  Incomplete  Blood Culture ID Panel (Reflexed)     Status: Abnormal   Collection Time: 07/13/17 10:15 AM  Result Value Ref Range Status   Enterococcus species NOT DETECTED NOT DETECTED Final   Listeria monocytogenes NOT DETECTED NOT DETECTED Final   Staphylococcus species NOT DETECTED NOT DETECTED Final   Staphylococcus aureus NOT DETECTED NOT DETECTED Final   Streptococcus species NOT DETECTED NOT DETECTED Final   Streptococcus agalactiae NOT DETECTED NOT DETECTED Final   Streptococcus pneumoniae NOT DETECTED NOT DETECTED Final   Streptococcus pyogenes NOT DETECTED NOT DETECTED Final   Acinetobacter baumannii NOT DETECTED NOT DETECTED Final   Enterobacteriaceae species DETECTED (A) NOT DETECTED Final    Comment: Enterobacteriaceae represent a large family of gram-negative bacteria, not a single organism. CRITICAL RESULT CALLED TO, READ BACK BY AND VERIFIED WITH: JGRIMSLEY(PHARMD) BY TCLEVELAND 07/14/2017 AT 5:37AM    Enterobacter cloacae complex NOT DETECTED NOT DETECTED Final   Escherichia coli DETECTED (A) NOT DETECTED Final    Comment: CRITICAL RESULT CALLED TO, READ BACK BY AND VERIFIED WITH: JGRIMSLEY(PHARMD) BY TCLEVELAND 07/14/2017 AT 5:37AM    Klebsiella oxytoca NOT DETECTED NOT DETECTED Final   Klebsiella pneumoniae NOT DETECTED NOT DETECTED Final   Proteus species NOT DETECTED NOT DETECTED Final   Serratia marcescens NOT DETECTED NOT DETECTED Final   Carbapenem resistance NOT DETECTED NOT DETECTED Final   Haemophilus influenzae NOT DETECTED NOT DETECTED Final   Neisseria meningitidis NOT DETECTED NOT DETECTED Final   Pseudomonas aeruginosa NOT DETECTED NOT DETECTED Final   Candida albicans NOT DETECTED NOT DETECTED Final   Candida glabrata NOT DETECTED NOT DETECTED Final   Candida krusei NOT DETECTED NOT DETECTED Final   Candida parapsilosis  NOT DETECTED NOT DETECTED Final   Candida tropicalis NOT DETECTED NOT DETECTED Final    Comment: Performed at [facility], [place] [address] [place], [place] [address]  Urine Culture     Status: Abnormal   Collection Time: 07/13/17  3:07 PM  Result Value Ref Range Status   Specimen Description   Final    CYSTOSCOPY URINE Performed at [facility] [address] [place], [place] [address]    Special Requests   Final    NONE Performed at [facility], [place] [address] [place], [place] [address]    Culture MULTIPLE SPECIES PRESENT, SUGGEST RECOLLECTION (A)  Final   Report Status 07/15/2017 FINAL  Final  MRSA PCR Screening     Status: None   Collection Time: 07/13/17  5:00 PM  Result Value Ref Range Status   MRSA by PCR NEGATIVE NEGATIVE Final    Comment:        The GeneXpert MRSA Assay (FDA approved for NASAL specimens only), is one component of a comprehensive MRSA colonization surveillance program. It is not intended to diagnose MRSA infection nor to guide or monitor treatment for MRSA infections. Performed at [facility], [place] [address] [place], [place] [address]       Radiology Studies: Dg C-arm 1-60 Min-no Report  Result Date: 07/13/2017 Fluoroscopy was utilized by the requesting physician.  No radiographic interpretation.    Scheduled Meds: . aspirin  325 mg Oral Daily  . escitalopram  10 mg Oral Daily  . fentaNYL  25 mcg Transdermal Q72H  . heparin  5,000 Units Subcutaneous Q8H  . hydrALAZINE  25 mg Oral Q6H  . levothyroxine  75 mcg Oral QAC breakfast  . lubiprostone  8 mcg Oral Daily  . multivitamin with minerals  1 tablet Oral Daily  . nebivolol  5 mg Oral Daily   Continuous Infusions: . cefTRIAXone (ROCEPHIN)  IV Stopped (07/15/17 1347)     LOS: 2 days   Time spent: 25 minutes.  Vance Gather, MD [facility] Pager [phone]  If 7PM-7AM, please contact  night-coverage [email or web address] Password [email or web address] 07/15/2017, 2:51 PM

## 2017-07-15 NOTE — NC FL2 (Signed)
[place] LEVEL OF CARE SCREENING TOOL     IDENTIFICATION  Patient Name: Alexandra Alexander Birthdate: [birth year] Sex: female Admission Date (Current Location): 07/13/2017  [facility] and [place] Number:  Herbalist and Address:  [facility],  [place] [address], [place]      Provider Number: [id number]  Attending Physician Name and Address:  Patrecia Pour, MD  Relative Name and Phone Number:       Current Level of Care: Hospital Recommended Level of Care: [place] Prior Approval Number:    Date Approved/Denied:   PASRR Number:    Discharge Plan: SNF    Current Diagnoses: Patient Active Problem List   Diagnosis Date Noted  . AKI (acute kidney injury) ([place]) 07/14/2017  . Stage 3 chronic kidney disease ([place]) 07/14/2017  . Acute pyelonephritis 07/14/2017  . Left ureteral stone 07/14/2017  . Elevated troponin 07/14/2017  . Hypothyroidism 07/14/2017  . Sepsis due to Escherichia coli (E. coli) ([place]) 07/14/2017  . E coli bacteremia 07/14/2017  . Sepsis ([place]) 07/13/2017  . Hypertension 05/03/2017  . Mild renal insufficiency 05/03/2017    Orientation RESPIRATION BLADDER Height & Weight     Self, Time, Situation, Place  Normal Incontinent, Indwelling catheter Weight: 127 lb 6.8 oz (57.8 kg) Height:  5\' 2"  (157.5 cm)  BEHAVIORAL SYMPTOMS/MOOD NEUROLOGICAL BOWEL NUTRITION STATUS      Incontinent Diet(heart healthy)  AMBULATORY STATUS COMMUNICATION OF NEEDS Skin   Limited Assist Verbally Normal                       Personal Care Assistance Level of Assistance  Bathing, Feeding, Dressing Bathing Assistance: Limited assistance Feeding assistance: Independent Dressing Assistance: Limited assistance     Functional Limitations Info  Sight, Hearing, Speech Sight Info: Adequate Hearing Info: Adequate Speech Info: Adequate    SPECIAL CARE FACTORS FREQUENCY  PT (By licensed PT), OT (By licensed OT)     PT  Frequency: 5x OT Frequency: 5x            Contractures Contractures Info: Not present    Additional Factors Info  Code Status, Allergies Code Status Info: DNR Allergies Info: Codeine, vancomycin           Current Medications (07/15/2017):  This is the current hospital active medication list Current Facility-Administered Medications  Medication Dose Route Frequency Provider Last Rate Last Dose  . aspirin tablet 325 mg  325 mg Oral Daily Kinnie Feil, MD   325 mg at 07/15/17 0920  . cefTRIAXone (ROCEPHIN) 2 g in dextrose 5 % 50 mL IVPB  2 g Intravenous Q24H Patrecia Pour, MD   Stopped at 07/15/17 1347  . escitalopram (LEXAPRO) tablet 10 mg  10 mg Oral Daily Kinnie Feil, MD   10 mg at 07/15/17 0920  . fentaNYL (DURAGESIC - dosed mcg/hr) patch 25 mcg  25 mcg Transdermal Q72H Kinnie Feil, MD   25 mcg at 07/13/17 2011  . heparin injection 5,000 Units  5,000 Units Subcutaneous Q8H Kinnie Feil, MD   5,000 Units at 07/15/17 1425  . hydrALAZINE (APRESOLINE) injection 10 mg  10 mg Intravenous Q2H PRN Patrecia Pour, MD   10 mg at 07/14/17 1747  . hydrALAZINE (APRESOLINE) tablet 25 mg  25 mg Oral Q6H Patrecia Pour, MD   25 mg at 07/15/17 1202  . levothyroxine (SYNTHROID, LEVOTHROID) tablet 75 mcg  75 mcg Oral QAC breakfast  Kinnie Feil, MD   75 mcg at 07/15/17 [phone]  . lubiprostone (AMITIZA) capsule 8 mcg  8 mcg Oral Daily Kinnie Feil, MD   8 mcg at 07/14/17 1048  . multivitamin with minerals tablet 1 tablet  1 tablet Oral Daily Patrecia Pour, MD   1 tablet at 07/15/17 8588  . nebivolol (BYSTOLIC) tablet 5 mg  5 mg Oral Daily Kinnie Feil, MD   5 mg at 07/15/17 5027  . ondansetron (ZOFRAN) injection 4 mg  4 mg Intravenous Q6H PRN Schorr, Rhetta Mura, NP   4 mg at 07/14/17 2153  . polyethylene glycol (MIRALAX / GLYCOLAX) packet 17 g  17 g Oral Daily PRN Kinnie Feil, MD         Discharge Medications: Please see discharge summary for a list of  discharge medications.  Relevant Imaging Results:  Relevant Lab Results:   Additional Information SS# [id number]. Has leg prosthesis  Nila Nephew, LCSW

## 2017-07-15 NOTE — Progress Notes (Deleted)
Medications administered by student RN [id number] with supervision of Clinical Instructor Keylah Darwish MSN, RN-BC or patient's assigned RN.   

## 2017-07-15 NOTE — Evaluation (Signed)
Physical Therapy Evaluation Patient Details Name: Alexandra Alexander MRN: [id number] DOB: Mar 06, 1922 Today's Date: 07/15/2017   History of Present Illness  Pt is a 84 y.o. female admitted with fever and abdominal pain and weakness, underwent cystoscopy with left retrograde pyelogram and insertion of left double-J stent (2/3). Hx significant for HTN, CKD, Chronic pain syndrome, UTIs, L BKA.     Clinical Impression  Pt admitted with problems listed above. Pt currently with functional limitations due to the deficits listed below (See PT Problem List). Pt will benefit from skilled PT to increase their independence and safety with mobility to allow discharge. Pt requested to use BSC prior to ambulating, performed pericare with minimal assistance. Pt ambulated in hallway, limited distance to due fatigue. Pt reported SOB upon return to room, checked SpO2 (97%) on RA, telemetry reading HR in 80s with Afib, RN was notified. Discussed pt need for more assistance with daughter, daughter in agreement. Recommend SNF.     Follow Up Recommendations SNF;Supervision/Assistance - 24 hour    Equipment Recommendations  None recommended by PT    Recommendations for Other Services       Precautions / Restrictions Precautions Precautions: Fall Restrictions Weight Bearing Restrictions: No      Mobility  Bed Mobility Overal bed mobility: Needs Assistance Bed Mobility: Supine to Sit     Supine to sit: Min assist     General bed mobility comments: assist for trunk and donning prosthesis in bed  Transfers Overall transfer level: Needs assistance Equipment used: Rolling walker (2 wheeled) Transfers: Sit to/from [facility] Sit to Stand: Min assist Stand pivot transfers: Min assist       General transfer comment: assist for rising and descending, steadying; cues for UE placement, posture, sequence for pivoting to [facility]  Ambulation/Gait Ambulation/Gait assistance: Min guard Ambulation  Distance (Feet): 80 Feet Assistive device: Rolling walker (2 wheeled) Gait Pattern/deviations: Step-through pattern;Decreased step length - left;Decreased stride length;Trunk flexed     General Gait Details: min/guard for safety, limited distance due to fatigue; cues for posture, RW positioning  Stairs            Wheelchair Mobility    Modified Rankin (Stroke Patients Only)       Balance Overall balance assessment: Needs assistance Sitting-balance support: Feet supported Sitting balance-Leahy Scale: Good     Standing balance support: Bilateral upper extremity supported;During functional activity Standing balance-Leahy Scale: Poor                               Pertinent Vitals/Pain Pain Assessment: No/denies pain    Home Living Family/patient expects to be discharged to:: Assisted living               Home Equipment: Walker - 2 wheels      Prior Function Level of Independence: Independent with assistive device(s)         Comments: ambulates with walker, has L BKA with prosthesis     Hand Dominance        Extremity/Trunk Assessment   Upper Extremity Assessment Upper Extremity Assessment: Generalized weakness    Lower Extremity Assessment Lower Extremity Assessment: Generalized weakness;LLE deficits/detail LLE Deficits / Details: L BKA with prosthesis    Cervical / Trunk Assessment Cervical / Trunk Assessment: Kyphotic  Communication   Communication: HOH  Cognition Arousal/Alertness: Awake/alert Behavior During Therapy: WFL for tasks assessed/performed Overall Cognitive Status: Within Functional Limits for tasks assessed  General Comments      Exercises     Assessment/Plan    PT Assessment Patient needs continued PT services  PT Problem List Decreased strength;Decreased mobility;Decreased safety awareness;Decreased coordination;Decreased activity tolerance;Decreased  knowledge of use of DME;Decreased balance       PT Treatment Interventions DME instruction;Therapeutic activities;Gait training;Therapeutic exercise;Patient/family education;Functional mobility training;Balance training    PT Goals (Current goals can be found in the Care Plan section)  Acute Rehab PT Goals Patient Stated Goal: to feel better to go home PT Goal Formulation: With patient/family Time For Goal Achievement: 07/22/17 Potential to Achieve Goals: Good    Frequency Min 2X/week   Barriers to discharge        Co-evaluation               AM-PAC PT "6 Clicks" Daily Activity  Outcome Measure Difficulty turning over in bed (including adjusting bedclothes, sheets and blankets)?: A Little Difficulty moving from lying on back to sitting on the side of the bed? : Unable Difficulty sitting down on and standing up from a chair with arms (e.g., wheelchair, bedside commode, etc,.)?: Unable Help needed moving to and from a bed to chair (including a wheelchair)?: A Little Help needed walking in hospital room?: A Little Help needed climbing 3-5 steps with a railing? : Total 6 Click Score: 12    End of Session Equipment Utilized During Treatment: Gait belt Activity Tolerance: Patient tolerated treatment well Patient left: in chair;with call bell/phone within reach;with family/visitor present Nurse Communication: Mobility status PT Visit Diagnosis: Other abnormalities of gait and mobility (R26.89);Muscle weakness (generalized) (M62.81)    Time: 0165-5374 PT Time Calculation (min) (ACUTE ONLY): 28 min   Charges:   PT Evaluation $PT Eval Moderate Complexity: 1 Mod     PT G Codes:       [place] Seanpatrick Maisano, SPT  [place] Jennelle Pinkstaff 07/15/2017, 12:37 PM

## 2017-07-16 ENCOUNTER — Inpatient Hospital Stay (HOSPITAL_COMMUNITY): Payer: Medicare Other

## 2017-07-16 DIAGNOSIS — N179 Acute kidney failure, unspecified: Secondary | ICD-10-CM

## 2017-07-16 DIAGNOSIS — R140 Abdominal distension (gaseous): Secondary | ICD-10-CM

## 2017-07-16 DIAGNOSIS — N10 Acute tubulo-interstitial nephritis: Secondary | ICD-10-CM

## 2017-07-16 DIAGNOSIS — A4151 Sepsis due to Escherichia coli [E. coli]: Principal | ICD-10-CM

## 2017-07-16 LAB — CBC
HEMATOCRIT: 27.7 % — AB (ref 36.0–46.0)
Hemoglobin: 9.3 g/dL — ABNORMAL LOW (ref 12.0–15.0)
MCH: 29.9 pg (ref 26.0–34.0)
MCHC: 33.6 g/dL (ref 30.0–36.0)
MCV: 89.1 fL (ref 78.0–100.0)
Platelets: 123 10*3/uL — ABNORMAL LOW (ref 150–400)
RBC: 3.11 MIL/uL — AB (ref 3.87–5.11)
RDW: 15.1 % (ref 11.5–15.5)
WBC: 20.7 10*3/uL — AB (ref 4.0–10.5)

## 2017-07-16 LAB — CULTURE, BLOOD (ROUTINE X 2)
SPECIAL REQUESTS: ADEQUATE
SPECIAL REQUESTS: ADEQUATE

## 2017-07-16 LAB — BASIC METABOLIC PANEL
ANION GAP: 5 (ref 5–15)
BUN: 39 mg/dL — ABNORMAL HIGH (ref 6–20)
CHLORIDE: 114 mmol/L — AB (ref 101–111)
CO2: 23 mmol/L (ref 22–32)
Calcium: 8.2 mg/dL — ABNORMAL LOW (ref 8.9–10.3)
Creatinine, Ser: 1.36 mg/dL — ABNORMAL HIGH (ref 0.44–1.00)
GFR calc non Af Amer: 32 mL/min — ABNORMAL LOW (ref >60)
GFR, EST AFRICAN AMERICAN: 38 mL/min — AB (ref >60)
Glucose, Bld: 97 mg/dL (ref 65–99)
Potassium: 3.8 mmol/L (ref 3.5–5.1)
SODIUM: 142 mmol/L (ref 135–145)

## 2017-07-16 MED ORDER — FENTANYL CITRATE (PF) 100 MCG/2ML IJ SOLN
12.5000 ug | INTRAMUSCULAR | Status: DC | PRN
  Administered 2017-07-16 (×2): 12.5 ug via INTRAVENOUS
  Filled 2017-07-16 (×2): qty 2

## 2017-07-16 MED ORDER — FENTANYL 50 MCG/HR TD PT72
50.0000 ug | MEDICATED_PATCH | TRANSDERMAL | Status: DC
  Administered 2017-07-16: 50 ug via TRANSDERMAL
  Filled 2017-07-16: qty 1

## 2017-07-16 MED ORDER — ALUM & MAG HYDROXIDE-SIMETH 200-200-20 MG/5ML PO SUSP
15.0000 mL | ORAL | Status: DC | PRN
  Administered 2017-07-16 – 2017-07-18 (×2): 15 mL via ORAL
  Filled 2017-07-16 (×2): qty 30

## 2017-07-16 MED ORDER — ACETAMINOPHEN 325 MG PO TABS
650.0000 mg | ORAL_TABLET | Freq: Four times a day (QID) | ORAL | Status: DC | PRN
  Administered 2017-07-16: 650 mg via ORAL
  Filled 2017-07-16: qty 2

## 2017-07-16 NOTE — Plan of Care (Signed)
  Progressing Education: Knowledge of General Education information will improve 07/16/2017 0108 - Progressing by Talbert Forest, RN Clinical Measurements: Ability to maintain clinical measurements within normal limits will improve 07/16/2017 0108 - Progressing by Talbert Forest, RN Will remain free from infection 07/16/2017 0108 - Progressing by Talbert Forest, RN Diagnostic test results will improve 07/16/2017 0108 - Progressing by Talbert Forest, RN Respiratory complications will improve 07/16/2017 0108 - Progressing by Talbert Forest, RN Cardiovascular complication will be avoided 07/16/2017 0108 - Progressing by Talbert Forest, RN Nutrition: Adequate nutrition will be maintained 07/16/2017 0108 - Progressing by Talbert Forest, RN Elimination: Will not experience complications related to bowel motility 07/16/2017 0108 - Progressing by Talbert Forest, RN Will not experience complications related to urinary retention 07/16/2017 0108 - Progressing by Talbert Forest, RN Pain Managment: General experience of comfort will improve 07/16/2017 0108 - Progressing by Talbert Forest, RN Safety: Ability to remain free from injury will improve 07/16/2017 0108 - Progressing by Talbert Forest, RN

## 2017-07-16 NOTE — Progress Notes (Addendum)
PROGRESS NOTE  Alexandra Alexander  VOH:[id number] DOB: Apr 30, 1922 DOA: 07/13/2017 PCP: [name], MD  Brief Narrative:  Alexandra Alexander is a 84 y.o. female with a history of HTN, stage III CKD, chronic pain syndrome and UTI's who presented 2/3 with fever and abdominal pain found to be septic. Lactic acid was 9, Creatinine 2.3. Vancomycin and zosyn were administered and the patient admitted to the SDU. CT abdomen showed evidence of left pyelonephritis, nephrolithiasis and hydronephrosis and the patient underwent ureteral stenting 2/3. Troponins returned elevated at 0.34-0.35, trending downward in the absence of chest pain that, after discussion with cardiology, was felt to indicate demand ischemia. Blood cultures have grown E. coli, antibiotics narrowed to ceftriaxone. Repeat blood cultures drawn 2/5. Pt has defervesced.  Developed diarrhea on 2/6, but was on laxative.  Laxative has been held.  Patient complaining of abdominal pain and distended on exam.    Assessment & Plan:  Sepsis due to E. coli bacteremia and left pyelonephritis: Lactic acid trending downward.  Sepsis physiology resolved. -  Continue ceftriaxone and plan to transition keflex at discharge - Can DC fluids, taking po. - Repeat blood cultures 2/5 NGTD  Left obstructive nephrolithiasis: s/p stenting by Dr. Jeffie Pollock 2/3.  - Monitor renal function - Continued foley per urology, plan to transition to [place] system. Defer to urology Re: removal.   AKI on CKD stage III: Due to obstructive uropathy and sepsis. SCr 2.39 on admission, now back to baseline - Holding ARB  Diarrhea and abdominal pain, may be related to pyelonephritis but at risk for C. diff -  D/c lubiprostone -  Monitor for ongoing diarrhea and if persistent, plan to check for C. Diff -  PVR with insignificant urine  Chronic pain syndrome:  - increase back to fentanyl 73mcg patch (home dose) -  Add prn fentanyl  Troponin elevation: Most consistent with  demand ischemia in setting of sepsis and possible ischemic event in the past. No wall motion abnormalities on echo, EF preserved, no diastolic dysfunction noted and no vegetations noted. - Started ASA, continue beta blocker. Not on statin.  - Frequent PACs/PVCs, will continue telemetry.   HTN: Holding BP meds with sepsis.   Hypothyroidism: TSH 2.012 - Continue synthroid  Leukocytosis, persistent despite 3 days of antibiotics -  eval for C. Diff as above -  Continue tx for UTI -  Repeat CBC in AM  Normocytic anemia, likely due to acute illness, hemoglobin appears to be stabilizing near 9.3g/dl.  DVT prophylaxis: Heparin Code Status: DNR Family Communication: Daughter and SIL at bedside Disposition Plan: Planning discharge to SNF. CSW consulted.    Consultants:   Urology   Procedures:  Left ureteral stent placed on 2/3  Antimicrobials:  Anti-infectives (From admission, onward)   Start     Dose/Rate Route Frequency Ordered Stop   07/14/17 1200  cefTRIAXone (ROCEPHIN) 2 g in dextrose 5 % 50 mL IVPB     2 g 100 mL/hr over 30 Minutes Intravenous Every 24 hours 07/14/17 0824     07/13/17 2100  piperacillin-tazobactam (ZOSYN) IVPB 2.25 g  Status:  Discontinued     2.25 g 100 mL/hr over 30 Minutes Intravenous Every 8 hours 07/13/17 1520 07/14/17 0824   07/13/17 1215  piperacillin-tazobactam (ZOSYN) IVPB 3.375 g     3.375 g 100 mL/hr over 30 Minutes Intravenous  Once 07/13/17 1210 07/13/17 1325   07/13/17 0930  levofloxacin (LEVAQUIN) IVPB 750 mg     750 mg 100 mL/hr  over 90 Minutes Intravenous  Once 07/13/17 0925 07/13/17 1138   07/13/17 0930  aztreonam (AZACTAM) 2 g in dextrose 5 % 50 mL IVPB     2 g 100 mL/hr over 30 Minutes Intravenous  Once 07/13/17 0925 07/13/17 1059   07/13/17 0930  vancomycin (VANCOCIN) IVPB 1000 mg/200 mL premix  Status:  Discontinued     1,000 mg 200 mL/hr over 60 Minutes Intravenous  Once 07/13/17 0925 07/13/17 0926        Subjective:  Having severe pain in her "bottom," possibly dysuria, but hard to discern if she means from her urethra or anus.  Per RN, having frequent loose stools.  Mildly confused.  Objective: Vitals:   07/16/17 0926 07/16/17 1025 07/16/17 1500 07/16/17 2049  BP: (!) 184/76 (!) 142/70 (!) 146/88 131/73  Pulse:   70 92  Resp:   17 17  Temp:    98.9 F (37.2 C)  TempSrc:    Oral  SpO2:   98% 97%  Weight:      Height:        Intake/Output Summary (Last 24 hours) at 07/16/2017 2134 Last data filed at 07/16/2017 2056 Gross per 24 hour  Intake 702 ml  Output 100 ml  Net 602 ml   Filed Weights   07/13/17 0920 07/13/17 2000  Weight: 56.7 kg (125 lb) 57.8 kg (127 lb 6.8 oz)    Examination:  General exam:  Adult female.  No acute distress. Sitting in chair, confused.   HEENT:  NCAT, MMM Respiratory system: Clear to auscultation bilaterally Cardiovascular system: Regular rate and rhythm, normal S1/S2. No murmurs, rubs, gallops or clicks.  Warm extremities Gastrointestinal system: Normal active bowel sounds, moderately distended, TTP with distended loops of bowel palpable.   MSK:  Normal tone and bulk, no lower extremity edema Neuro:  Grossly moves all extremities    Data Reviewed: I have personally reviewed following labs and imaging studies  CBC: Recent Labs  Lab 07/13/17 0915 07/14/17 0004 07/16/17 0418  WBC 9.2 25.0* 20.7*  NEUTROABS 8.7* 22.4*  --   HGB 12.0 9.3* 9.3*  HCT 34.9* 26.6* 27.7*  MCV 88.6 88.4 89.1  PLT 107* 132* 354*   Basic Metabolic Panel: Recent Labs  Lab 07/13/17 0915 07/14/17 0154 07/15/17 0445 07/16/17 0418  NA 143 141 144 142  K 3.6 4.2 3.6 3.8  CL 107 110 113* 114*  CO2 18* 23 23 23   GLUCOSE 176* 120* 101* 97  BUN 41* 52* 41* 39*  CREATININE 2.39* 2.54* 1.74* 1.36*  CALCIUM 9.2 7.9* 8.2* 8.2*   GFR: Estimated Creatinine Clearance: 20.4 mL/min (A) (by C-G formula based on SCr of 1.36 mg/dL (H)). Liver Function  Tests: Recent Labs  Lab 07/13/17 0915 07/14/17 0154  AST 92* 55*  ALT 57* 38  ALKPHOS 167* 64  BILITOT 0.8 0.7  PROT 6.7 5.3*  ALBUMIN 3.7 2.7*   No results for input(s): LIPASE, AMYLASE in the last 168 hours. No results for input(s): AMMONIA in the last 168 hours. Coagulation Profile: No results for input(s): INR, PROTIME in the last 168 hours. Cardiac Enzymes: Recent Labs  Lab 07/13/17 1225 07/13/17 1833 07/14/17 0004  TROPONINI 0.34* 0.35* 0.26*   BNP (last 3 results) No results for input(s): PROBNP in the last 8760 hours. HbA1C: No results for input(s): HGBA1C in the last 72 hours. CBG: No results for input(s): GLUCAP in the last 168 hours. Lipid Profile: No results for input(s): CHOL, HDL, LDLCALC, TRIG, CHOLHDL, LDLDIRECT  in the last 72 hours. Thyroid Function Tests: No results for input(s): TSH, T4TOTAL, FREET4, T3FREE, THYROIDAB in the last 72 hours. Anemia Panel: No results for input(s): VITAMINB12, FOLATE, FERRITIN, TIBC, IRON, RETICCTPCT in the last 72 hours. Urine analysis:    Component Value Date/Time   COLORURINE YELLOW 07/13/2017 1140   APPEARANCEUR CLOUDY (A) 07/13/2017 1140   LABSPEC 1.012 07/13/2017 1140   PHURINE 5.0 07/13/2017 1140   GLUCOSEU NEGATIVE 07/13/2017 1140   HGBUR LARGE (A) 07/13/2017 1140   BILIRUBINUR NEGATIVE 07/13/2017 1140   KETONESUR NEGATIVE 07/13/2017 1140   PROTEINUR 30 (A) 07/13/2017 1140   NITRITE POSITIVE (A) 07/13/2017 1140   LEUKOCYTESUR MODERATE (A) 07/13/2017 1140   Sepsis Labs: @LABRCNTIP (procalcitonin:4,lacticidven:4)  ) Recent Results (from the past 240 hour(s))  Blood Culture (routine x 2)     Status: Abnormal   Collection Time: 07/13/17  9:20 AM  Result Value Ref Range Status   Specimen Description BLOOD RIGHT ARM  Final   Special Requests   Final    BOTTLES DRAWN AEROBIC AND ANAEROBIC Blood Culture adequate volume   Culture  Setup Time   Final    IN BOTH AEROBIC AND ANAEROBIC BOTTLES GRAM NEGATIVE  RODS CRITICAL VALUE NOTED.  VALUE IS CONSISTENT WITH PREVIOUSLY REPORTED AND CALLED VALUE.    Culture (A)  Final    ESCHERICHIA COLI SUSCEPTIBILITIES PERFORMED ON PREVIOUS CULTURE WITHIN THE LAST 5 DAYS. Performed at [facility], [place] [address] [place], [place] [address]    Report Status 07/16/2017 FINAL  Final  Blood Culture (routine x 2)     Status: Abnormal   Collection Time: 07/13/17 10:15 AM  Result Value Ref Range Status   Specimen Description BLOOD RIGHT ARM  Final   Special Requests   Final    BOTTLES DRAWN AEROBIC AND ANAEROBIC Blood Culture adequate volume   Culture  Setup Time   Final    IN BOTH AEROBIC AND ANAEROBIC BOTTLES GRAM NEGATIVE RODS CRITICAL RESULT CALLED TO, READ BACK BY AND VERIFIED WITH: JGRIMSLEY(PHARMD) BY TCLEVELAND 07/14/2017 AT 5:37AM Performed at [facility], [address] [place], [place] [address]    Culture ESCHERICHIA COLI (A)  Final   Report Status 07/16/2017 FINAL  Final   Organism ID, Bacteria ESCHERICHIA COLI  Final      Susceptibility   Escherichia coli - MIC*    AMPICILLIN >=32 RESISTANT Resistant     CEFAZOLIN 8 SENSITIVE Sensitive     CEFEPIME <=1 SENSITIVE Sensitive     CEFTAZIDIME <=1 SENSITIVE Sensitive     CEFTRIAXONE <=1 SENSITIVE Sensitive     CIPROFLOXACIN <=0.25 SENSITIVE Sensitive     GENTAMICIN <=1 SENSITIVE Sensitive     IMIPENEM <=0.25 SENSITIVE Sensitive     TRIMETH/SULFA <=20 SENSITIVE Sensitive     AMPICILLIN/SULBACTAM >=32 RESISTANT Resistant     PIP/TAZO <=4 SENSITIVE Sensitive     Extended ESBL NEGATIVE Sensitive     * ESCHERICHIA COLI  Blood Culture ID Panel (Reflexed)     Status: Abnormal   Collection Time: 07/13/17 10:15 AM  Result Value Ref Range Status   Enterococcus species NOT DETECTED NOT DETECTED Final   Listeria monocytogenes NOT DETECTED NOT DETECTED Final   Staphylococcus species NOT DETECTED NOT DETECTED Final   Staphylococcus aureus NOT DETECTED NOT DETECTED Final   Streptococcus  species NOT DETECTED NOT DETECTED Final   Streptococcus agalactiae NOT DETECTED NOT DETECTED Final   Streptococcus pneumoniae NOT DETECTED NOT DETECTED Final   Streptococcus  pyogenes NOT DETECTED NOT DETECTED Final   Acinetobacter baumannii NOT DETECTED NOT DETECTED Final   Enterobacteriaceae species DETECTED (A) NOT DETECTED Final    Comment: Enterobacteriaceae represent a large family of gram-negative bacteria, not a single organism. CRITICAL RESULT CALLED TO, READ BACK BY AND VERIFIED WITH: JGRIMSLEY(PHARMD) BY TCLEVELAND 07/14/2017 AT 5:37AM    Enterobacter cloacae complex NOT DETECTED NOT DETECTED Final   Escherichia coli DETECTED (A) NOT DETECTED Final    Comment: CRITICAL RESULT CALLED TO, READ BACK BY AND VERIFIED WITH: JGRIMSLEY(PHARMD) BY TCLEVELAND 07/14/2017 AT 5:37AM    Klebsiella oxytoca NOT DETECTED NOT DETECTED Final   Klebsiella pneumoniae NOT DETECTED NOT DETECTED Final   Proteus species NOT DETECTED NOT DETECTED Final   Serratia marcescens NOT DETECTED NOT DETECTED Final   Carbapenem resistance NOT DETECTED NOT DETECTED Final   Haemophilus influenzae NOT DETECTED NOT DETECTED Final   Neisseria meningitidis NOT DETECTED NOT DETECTED Final   Pseudomonas aeruginosa NOT DETECTED NOT DETECTED Final   Candida albicans NOT DETECTED NOT DETECTED Final   Candida glabrata NOT DETECTED NOT DETECTED Final   Candida krusei NOT DETECTED NOT DETECTED Final   Candida parapsilosis NOT DETECTED NOT DETECTED Final   Candida tropicalis NOT DETECTED NOT DETECTED Final    Comment: Performed at [facility], [place] [address], [place], [place] [address]  Urine Culture     Status: Abnormal   Collection Time: 07/13/17  3:07 PM  Result Value Ref Range Status   Specimen Description   Final    CYSTOSCOPY URINE Performed at [facility] [address] [place], [place] [address]    Special Requests   Final    NONE Performed at [facility], [place]  [address] [place], [place] [address]    Culture MULTIPLE SPECIES PRESENT, SUGGEST RECOLLECTION (A)  Final   Report Status 07/15/2017 FINAL  Final  MRSA PCR Screening     Status: None   Collection Time: 07/13/17  5:00 PM  Result Value Ref Range Status   MRSA by PCR NEGATIVE NEGATIVE Final    Comment:        The GeneXpert MRSA Assay (FDA approved for NASAL specimens only), is one component of a comprehensive MRSA colonization surveillance program. It is not intended to diagnose MRSA infection nor to guide or monitor treatment for MRSA infections. Performed at [facility], [place] [address] [place], [place] [address]   Culture, blood (routine x 2)     Status: None (Preliminary result)   Collection Time: 07/15/17  4:43 AM  Result Value Ref Range Status   Specimen Description   Final    BLOOD RIGHT ANTECUBITAL Performed at [facility] [address], [place], [place] [address]    Special Requests   Final    BOTTLES DRAWN AEROBIC AND ANAEROBIC Blood Culture adequate volume Performed at [facility] [address], [place], [place] [address]    Culture   Final    NO GROWTH 1 DAY Performed at [facility], [place] [address], [place], [place] [address]    Report Status PENDING  Incomplete  Culture, blood (routine x 2)     Status: None (Preliminary result)   Collection Time: 07/15/17  4:53 AM  Result Value Ref Range Status   Specimen Description   Final    BLOOD RIGHT FOREARM Performed at [facility] [address] [place], [place] [address]    Special Requests  Final    BOTTLES DRAWN AEROBIC ONLY Blood Culture adequate volume Performed at [facility] [address] [place], [place] [address]    Culture   Final    NO GROWTH 1 DAY Performed at [facility], [place] [address], [place], [place] [address]    Report Status PENDING  Incomplete      Radiology Studies: Dg  Abd Portable 1v  Result Date: 07/16/2017 CLINICAL DATA:  Abdominal distention and pain EXAM: PORTABLE ABDOMEN - 1 VIEW COMPARISON:  07/13/2017 FINDINGS: Scattered large and small bowel gas is noted. No obstructive changes are seen. A left ureteral stent is noted in satisfactory position. Degenerative changes of the lumbar spine are noted. No other focal abnormality is seen. IMPRESSION: No acute abnormality noted. Left ureteral stent in satisfactory position. Electronically Signed   By: Inez Catalina M.D.   On: 07/16/2017 14:56     Scheduled Meds: . aspirin  325 mg Oral Daily  . escitalopram  10 mg Oral Daily  . fentaNYL  50 mcg Transdermal Q72H  . heparin  5,000 Units Subcutaneous Q8H  . hydrALAZINE  25 mg Oral Q6H  . levothyroxine  75 mcg Oral QAC breakfast  . multivitamin with minerals  1 tablet Oral Daily  . nebivolol  5 mg Oral Daily   Continuous Infusions: . cefTRIAXone (ROCEPHIN)  IV Stopped (07/16/17 1200)     LOS: 3 days    Time spent: 30 min    Janece Canterbury, MD [facility] Pager [phone]  If 7PM-7AM, please contact night-coverage [email or web address] Password [facility] 07/16/2017, 9:34 PM

## 2017-07-16 NOTE — Care Management Important Message (Signed)
Important Message  Patient Details  Name: Alexandra Alexander MRN: [id number] Date of Birth: 18-Mar-1922   Medicare Important Message Given:  Yes    Kerin Salen 07/16/2017, 10:27 AMImportant Message  Patient Details  Name: Alexandra Alexander MRN: [id number] Date of Birth: 14-Sep-1921   Medicare Important Message Given:  Yes    Kerin Salen 07/16/2017, 10:27 AM

## 2017-07-16 NOTE — Clinical Social Work Note (Signed)
Clinical Social Work Assessment  Patient Details  Name: Alexandra Alexander MRN: [id number] Date of Birth: [birth year]  Date of referral:  07/16/17               Reason for consult:  Facility Placement(pt from facility)                Permission sought to share information with:  Family Supports Permission granted to share information::  Yes, Verbal Permission Granted  Name::     daughter Gregary Signs  Agency::     Relationship::     Contact Information:     Housing/Transportation Living arrangements for the past 2 months:  [place] of Information:  Patient, Adult Children Patient Interpreter Needed:  None Criminal Activity/Legal Involvement Pertinent to Current Situation/Hospitalization:  No - Comment as needed Significant Relationships:  None Lives with:  Facility Resident Do you feel safe going back to the place where you live?  Yes Need for family participation in patient care:  Yes (Comment)(pt asks that daughter be involved)  Care giving concerns:  Pt admitted from [facility]. She has resided there since October 2018.  At baseline ambulates around the halls of the ALF sometimes using walker or cane per daughter. Is oriented and no cognitive issues.    Social Worker assessment / plan:  CSW following as pt is admitted from facility- [facility]. Met with pt at bedside along with [place] [facility] intern. Pt was engaged and oriented. States she likes living at [facility]. Discussed with her that therapy recommendation is to go to SNF for rehab at [place] prior to returning home to ALF. Pt sates she is agreeable but advises speak with daughter. Discussed with daughter via phone- she states she and family agree with SNF recommendation and prefer [facility] or [facility]. CSW obtained PASSR, completed FL2 and made referrals.   Plan: SNF at [place] for [facility] [place] rehab.  Employment status:  Retired Forensic scientist:  [employer] PT Recommendations:  [place],  [place] / Referral to community resources:  [facility]  Patient/Family's Response to care:  Both pt and daughter appreciative  Patient/Family's Understanding of and Emotional Response to Diagnosis, Current Treatment, and Prognosis:  Pt demonstrates adequate understanding of her treatment and plan. Emotionally she was sad and CSW discussed for some time with her the cause of her mood. Pt states, "I am 84 years old, almost 89. I wish I didn't feel so bad. I am starting to wonder if my body is just breaking down." CSW validated her emotions and discussed with pt the activities and people in her life who bring her enjoyment. Pt expressed thanks.  Daughter also demonstrated good understanding and was invested in plan and emotionally well-adjusted.   Emotional Assessment Appearance:  Appears stated age Attitude/Demeanor/Rapport:  (sad, reflective) Affect (typically observed):  Sad Orientation:  Oriented to Self, Oriented to Place, Oriented to  Time, Oriented to Situation Alcohol / Substance use:  Not Applicable Psych involvement (Current and /or in the community):  No (Comment)  Discharge Needs  Concerns to be addressed:  Care Coordination, Discharge Planning Concerns Readmission within the last 30 days:  No Current discharge risk:  Dependent with Mobility Barriers to Discharge:  Continued Medical Work up   [employer], LCSW 07/16/2017, 8:12 AM  [phone]

## 2017-07-17 LAB — BASIC METABOLIC PANEL
Anion gap: 4 — ABNORMAL LOW (ref 5–15)
BUN: 29 mg/dL — ABNORMAL HIGH (ref 6–20)
CALCIUM: 8.4 mg/dL — AB (ref 8.9–10.3)
CO2: 27 mmol/L (ref 22–32)
CREATININE: 1.24 mg/dL — AB (ref 0.44–1.00)
Chloride: 109 mmol/L (ref 101–111)
GFR, EST AFRICAN AMERICAN: 42 mL/min — AB (ref >60)
GFR, EST NON AFRICAN AMERICAN: 36 mL/min — AB (ref >60)
Glucose, Bld: 99 mg/dL (ref 65–99)
Potassium: 3.7 mmol/L (ref 3.5–5.1)
SODIUM: 140 mmol/L (ref 135–145)

## 2017-07-17 LAB — CBC
HCT: 29.6 % — ABNORMAL LOW (ref 36.0–46.0)
Hemoglobin: 9.9 g/dL — ABNORMAL LOW (ref 12.0–15.0)
MCH: 30.4 pg (ref 26.0–34.0)
MCHC: 33.4 g/dL (ref 30.0–36.0)
MCV: 90.8 fL (ref 78.0–100.0)
PLATELETS: 119 10*3/uL — AB (ref 150–400)
RBC: 3.26 MIL/uL — AB (ref 3.87–5.11)
RDW: 14.9 % (ref 11.5–15.5)
WBC: 11.4 10*3/uL — ABNORMAL HIGH (ref 4.0–10.5)

## 2017-07-17 NOTE — Progress Notes (Signed)
Physical Therapy Treatment Patient Details Name: Alexandra Alexander MRN: [id number] DOB: 1922-02-05 Today's Date: 07/17/2017    History of Present Illness Pt is a 84 y.o. female admitted with fever and abdominal pain and weakness, underwent cystoscopy with left retrograde pyelogram and insertion of left double-J stent (2/3). Hx significant for HTN, CKD, Chronic pain syndrome, UTIs, L BKA.    PT Comments    Pt was pleasant and agreeable to therapy today. Pt able to stand pivot transfer to [facility] and was independent with pericare, prior to ambulating in hall with RW. Continue to recommend SNF, additional supervision upon d/c.  Follow Up Recommendations  SNF;Supervision/Assistance - 24 hour     Equipment Recommendations  None recommended by PT    Recommendations for Other Services       Precautions / Restrictions Precautions Precautions: Fall Restrictions Weight Bearing Restrictions: No    Mobility  Bed Mobility Overal bed mobility: Needs Assistance Bed Mobility: Supine to Sit     Supine to sit: Min guard     General bed mobility comments: assist for donning prosthesis in bed, min/guard for safety  Transfers Overall transfer level: Needs assistance Equipment used: Rolling walker (2 wheeled) Transfers: Sit to/from [facility] Sit to Stand: Min guard Stand pivot transfers: Min guard       General transfer comment: min/guard for safety, cues for UE placement, posture; pt transfered to [facility], self-performed pericare, pt tends to perform posterior lean against BSC surface to self support however able to correct with cues  Ambulation/Gait Ambulation/Gait assistance: Min guard Ambulation Distance (Feet): 100 Feet Assistive device: Rolling walker (2 wheeled) Gait Pattern/deviations: Step-through pattern;Decreased stride length;Trunk flexed     General Gait Details: min/guard for safety, verbal cues for posture, RW positioning   Stairs             Wheelchair Mobility    Modified Rankin (Stroke Patients Only)       Balance                                            Cognition Arousal/Alertness: Awake/alert Behavior During Therapy: WFL for tasks assessed/performed Overall Cognitive Status: Within Functional Limits for tasks assessed                                        Exercises      General Comments        Pertinent Vitals/Pain Pain Assessment: No/denies pain    Home Living                      Prior Function            PT Goals (current goals can now be found in the care plan section) Progress towards PT goals: Progressing toward goals    Frequency    Min 2X/week      PT Plan Current plan remains appropriate    Co-evaluation              AM-PAC PT "6 Clicks" Daily Activity  Outcome Measure  Difficulty turning over in bed (including adjusting bedclothes, sheets and blankets)?: A Little Difficulty moving from lying on back to sitting on the side of the bed? : A Lot Difficulty sitting down on and standing up from  a chair with arms (e.g., wheelchair, bedside commode, etc,.)?: A Lot Help needed moving to and from a bed to chair (including a wheelchair)?: A Little Help needed walking in hospital room?: A Little Help needed climbing 3-5 steps with a railing? : A Lot 6 Click Score: 15    End of Session Equipment Utilized During Treatment: Gait belt Activity Tolerance: Patient tolerated treatment well Patient left: in chair;with call bell/phone within reach;with family/visitor present Nurse Communication: Mobility status PT Visit Diagnosis: Other abnormalities of gait and mobility (R26.89);Muscle weakness (generalized) (M62.81)     Time: 7473-4037 PT Time Calculation (min) (ACUTE ONLY): 21 min  Charges:  $Gait Training: 8-22 mins                    G Codes:      [place] Emileo Semel, SPT  [place] Macedonio Scallon 07/17/2017, 4:14 PM

## 2017-07-17 NOTE — Progress Notes (Signed)
PROGRESS NOTE  Alexandra Alexander  NTI:[id number] DOB: February 11, 1922 DOA: 07/13/2017 PCP: [name], MD  Brief Narrative:  Alexandra Alexander is a 84 y.o. female with a history of HTN, stage III CKD, chronic pain syndrome and UTI's who presented 2/3 with fever and abdominal pain found to be septic. Lactic acid was 9, Creatinine 2.3. Vancomycin and zosyn were administered and the patient admitted to the SDU. CT abdomen showed evidence of left pyelonephritis, nephrolithiasis and hydronephrosis and the patient underwent ureteral stenting 2/3. Troponins returned elevated at 0.34-0.35, trending downward in the absence of chest pain that, after discussion with cardiology, was felt to indicate demand ischemia. Blood cultures have grown E. coli, antibiotics narrowed to ceftriaxone. Repeat blood cultures drawn 2/5. Pt has defervesced.  Developed diarrhea on 2/6, but was on laxative.  Laxative has been held.  ABdominal pain improving some, but still having frequent soft stools.  Had nausea this morning.    Assessment & Plan:  Sepsis due to E. coli bacteremia and left pyelonephritis: Lactic acid trending downward.  Sepsis physiology resolved. -  Continue ceftriaxone and plan to transition keflex at discharge - Can DC fluids, taking po. - Repeat blood cultures 2/5 NGTD  Left obstructive nephrolithiasis: s/p stenting by Dr. Jeffie Pollock 2/3.  - Monitor renal function - plan for stone extraction on Tues Feb 12 by Dr. Jeffie Pollock  AKI on CKD stage III: Due to obstructive uropathy and sepsis. SCr 2.39 on admission, now back to baseline - Holding ARB  Diarrhea and abdominal pain, may be related to pyelonephritis but at risk for C. Diff, still having frequent stools -  Monitor for ongoing diarrhea and if persistent, plan to check for C. Diff -  PVR with insignificant urine -  KUB:  No obstruction -  Advance to regular diet today   Chronic pain syndrome:  - continue fentanyl 22mcg patch (home dose) -  Add prn  fentanyl  Troponin elevation: Most consistent with demand ischemia in setting of sepsis and possible ischemic event in the past. No wall motion abnormalities on echo, EF preserved, no diastolic dysfunction noted and no vegetations noted. - Continue ASA, continue beta blocker. Not on statin.  - Frequent PACs/PVCs, will continue telemetry.   HTN: Holding BP meds with sepsis.   Hypothyroidism: TSH 2.012 - Continue synthroid  Leukocytosis, resolving -  eval for C. Diff as above -  Continue tx for UTI -  Repeat CBC in AM  Normocytic anemia, likely due to acute illness, hemoglobin appears to be stabilizing near 9.3g/dl.  DVT prophylaxis: Heparin Code Status: DNR Family Communication: Daughter and SIL at bedside Disposition Plan: Planning discharge to SNF if tolerating diet today. CSW consulted.    She is at risk for C. difficile diarrhea but I still feel that her stools are probably because of the laxatives that she was receiving until yesterday.  If her stools worsen, we will check for C. difficile.      Consultants:   Urology   Procedures:  Left ureteral stent placed on 2/3  Antimicrobials:  Anti-infectives (From admission, onward)   Start     Dose/Rate Route Frequency Ordered Stop   07/14/17 1200  cefTRIAXone (ROCEPHIN) 2 g in dextrose 5 % 50 mL IVPB     2 g 100 mL/hr over 30 Minutes Intravenous Every 24 hours 07/14/17 0824     07/13/17 2100  piperacillin-tazobactam (ZOSYN) IVPB 2.25 g  Status:  Discontinued     2.25 g 100 mL/hr over 30  Minutes Intravenous Every 8 hours 07/13/17 1520 07/14/17 0824   07/13/17 1215  piperacillin-tazobactam (ZOSYN) IVPB 3.375 g     3.375 g 100 mL/hr over 30 Minutes Intravenous  Once 07/13/17 1210 07/13/17 1325   07/13/17 0930  levofloxacin (LEVAQUIN) IVPB 750 mg     750 mg 100 mL/hr over 90 Minutes Intravenous  Once 07/13/17 0925 07/13/17 1138   07/13/17 0930  aztreonam (AZACTAM) 2 g in dextrose 5 % 50 mL IVPB     2 g 100 mL/hr over 30  Minutes Intravenous  Once 07/13/17 0925 07/13/17 1059   07/13/17 0930  vancomycin (VANCOCIN) IVPB 1000 mg/200 mL premix  Status:  Discontinued     1,000 mg 200 mL/hr over 60 Minutes Intravenous  Once 07/13/17 0925 07/13/17 0926       Subjective:  Had some nausea this morning that required antiemetic.  She had 4-5 loose stools since yesterday.  Denies abdominal pain.  Objective: Vitals:   07/17/17 0452 07/17/17 0800 07/17/17 1200 07/17/17 1431  BP: (!) 143/89 119/89 (!) 163/74 (!) 116/59  Pulse: 81 82  (!) 53  Resp: 18 20  18   Temp: 98.8 F (37.1 C) 97.8 F (36.6 C)  98 F (36.7 C)  TempSrc: Oral Oral  Oral  SpO2: 98% 98%  96%  Weight:      Height:        Intake/Output Summary (Last 24 hours) at 07/17/2017 1902 Last data filed at 07/17/2017 0531 Gross per 24 hour  Intake 0 ml  Output 400 ml  Net -400 ml   Filed Weights   07/13/17 0920 07/13/17 2000  Weight: 56.7 kg (125 lb) 57.8 kg (127 lb 6.8 oz)    Examination:  General exam:  Adult female.  No acute distress.  HEENT:  NCAT, MMM Respiratory system: Clear to auscultation bilaterally Cardiovascular system: Regular rate and rhythm, normal S1/S2. No murmurs, rubs, gallops or clicks.  Warm extremities Gastrointestinal system: Normal active bowel sounds, soft, mildly distended, nontender to palpation MSK:  Normal tone and bulk, no lower extremity edema Neuro:  Grossly intact  Data Reviewed: I have personally reviewed following labs and imaging studies  CBC: Recent Labs  Lab 07/13/17 0915 07/14/17 0004 07/16/17 0418 07/17/17 0831  WBC 9.2 25.0* 20.7* 11.4*  NEUTROABS 8.7* 22.4*  --   --   HGB 12.0 9.3* 9.3* 9.9*  HCT 34.9* 26.6* 27.7* 29.6*  MCV 88.6 88.4 89.1 90.8  PLT 107* 132* 123* 440*   Basic Metabolic Panel: Recent Labs  Lab 07/13/17 0915 07/14/17 0154 07/15/17 0445 07/16/17 0418 07/17/17 0831  NA 143 141 144 142 140  K 3.6 4.2 3.6 3.8 3.7  CL 107 110 113* 114* 109  CO2 18* 23 23 23 27   GLUCOSE  176* 120* 101* 97 99  BUN 41* 52* 41* 39* 29*  CREATININE 2.39* 2.54* 1.74* 1.36* 1.24*  CALCIUM 9.2 7.9* 8.2* 8.2* 8.4*   GFR: Estimated Creatinine Clearance: 22.4 mL/min (A) (by C-G formula based on SCr of 1.24 mg/dL (H)). Liver Function Tests: Recent Labs  Lab 07/13/17 0915 07/14/17 0154  AST 92* 55*  ALT 57* 38  ALKPHOS 167* 64  BILITOT 0.8 0.7  PROT 6.7 5.3*  ALBUMIN 3.7 2.7*   No results for input(s): LIPASE, AMYLASE in the last 168 hours. No results for input(s): AMMONIA in the last 168 hours. Coagulation Profile: No results for input(s): INR, PROTIME in the last 168 hours. Cardiac Enzymes: Recent Labs  Lab 07/13/17 1225 07/13/17  1833 07/14/17 0004  TROPONINI 0.34* 0.35* 0.26*   BNP (last 3 results) No results for input(s): PROBNP in the last 8760 hours. HbA1C: No results for input(s): HGBA1C in the last 72 hours. CBG: No results for input(s): GLUCAP in the last 168 hours. Lipid Profile: No results for input(s): CHOL, HDL, LDLCALC, TRIG, CHOLHDL, LDLDIRECT in the last 72 hours. Thyroid Function Tests: No results for input(s): TSH, T4TOTAL, FREET4, T3FREE, THYROIDAB in the last 72 hours. Anemia Panel: No results for input(s): VITAMINB12, FOLATE, FERRITIN, TIBC, IRON, RETICCTPCT in the last 72 hours. Urine analysis:    Component Value Date/Time   COLORURINE YELLOW 07/13/2017 1140   APPEARANCEUR CLOUDY (A) 07/13/2017 1140   LABSPEC 1.012 07/13/2017 1140   PHURINE 5.0 07/13/2017 1140   GLUCOSEU NEGATIVE 07/13/2017 1140   HGBUR LARGE (A) 07/13/2017 1140   BILIRUBINUR NEGATIVE 07/13/2017 1140   KETONESUR NEGATIVE 07/13/2017 1140   PROTEINUR 30 (A) 07/13/2017 1140   NITRITE POSITIVE (A) 07/13/2017 1140   LEUKOCYTESUR MODERATE (A) 07/13/2017 1140   Sepsis Labs: @LABRCNTIP (procalcitonin:4,lacticidven:4)  ) Recent Results (from the past 240 hour(s))  Blood Culture (routine x 2)     Status: Abnormal   Collection Time: 07/13/17  9:20 AM  Result Value Ref  Range Status   Specimen Description BLOOD RIGHT ARM  Final   Special Requests   Final    BOTTLES DRAWN AEROBIC AND ANAEROBIC Blood Culture adequate volume   Culture  Setup Time   Final    IN BOTH AEROBIC AND ANAEROBIC BOTTLES GRAM NEGATIVE RODS CRITICAL VALUE NOTED.  VALUE IS CONSISTENT WITH PREVIOUSLY REPORTED AND CALLED VALUE.    Culture (A)  Final    ESCHERICHIA COLI SUSCEPTIBILITIES PERFORMED ON PREVIOUS CULTURE WITHIN THE LAST 5 DAYS. Performed at [facility], [place] [address] [place], [place] [address]    Report Status 07/16/2017 FINAL  Final  Blood Culture (routine x 2)     Status: Abnormal   Collection Time: 07/13/17 10:15 AM  Result Value Ref Range Status   Specimen Description BLOOD RIGHT ARM  Final   Special Requests   Final    BOTTLES DRAWN AEROBIC AND ANAEROBIC Blood Culture adequate volume   Culture  Setup Time   Final    IN BOTH AEROBIC AND ANAEROBIC BOTTLES GRAM NEGATIVE RODS CRITICAL RESULT CALLED TO, READ BACK BY AND VERIFIED WITH: JGRIMSLEY(PHARMD) BY TCLEVELAND 07/14/2017 AT 5:37AM Performed at [facility], [place] [address], [place], [place] [address]    Culture ESCHERICHIA COLI (A)  Final   Report Status 07/16/2017 FINAL  Final   Organism ID, Bacteria ESCHERICHIA COLI  Final      Susceptibility   Escherichia coli - MIC*    AMPICILLIN >=32 RESISTANT Resistant     CEFAZOLIN 8 SENSITIVE Sensitive     CEFEPIME <=1 SENSITIVE Sensitive     CEFTAZIDIME <=1 SENSITIVE Sensitive     CEFTRIAXONE <=1 SENSITIVE Sensitive     CIPROFLOXACIN <=0.25 SENSITIVE Sensitive     GENTAMICIN <=1 SENSITIVE Sensitive     IMIPENEM <=0.25 SENSITIVE Sensitive     TRIMETH/SULFA <=20 SENSITIVE Sensitive     AMPICILLIN/SULBACTAM >=32 RESISTANT Resistant     PIP/TAZO <=4 SENSITIVE Sensitive     Extended ESBL NEGATIVE Sensitive     * ESCHERICHIA COLI  Blood Culture ID Panel (Reflexed)     Status: Abnormal   Collection Time: 07/13/17 10:15 AM  Result Value Ref Range Status    Enterococcus species NOT DETECTED NOT DETECTED  Final   Listeria monocytogenes NOT DETECTED NOT DETECTED Final   Staphylococcus species NOT DETECTED NOT DETECTED Final   Staphylococcus aureus NOT DETECTED NOT DETECTED Final   Streptococcus species NOT DETECTED NOT DETECTED Final   Streptococcus agalactiae NOT DETECTED NOT DETECTED Final   Streptococcus pneumoniae NOT DETECTED NOT DETECTED Final   Streptococcus pyogenes NOT DETECTED NOT DETECTED Final   Acinetobacter baumannii NOT DETECTED NOT DETECTED Final   Enterobacteriaceae species DETECTED (A) NOT DETECTED Final    Comment: Enterobacteriaceae represent a large family of gram-negative bacteria, not a single organism. CRITICAL RESULT CALLED TO, READ BACK BY AND VERIFIED WITH: JGRIMSLEY(PHARMD) BY TCLEVELAND 07/14/2017 AT 5:37AM    Enterobacter cloacae complex NOT DETECTED NOT DETECTED Final   Escherichia coli DETECTED (A) NOT DETECTED Final    Comment: CRITICAL RESULT CALLED TO, READ BACK BY AND VERIFIED WITH: JGRIMSLEY(PHARMD) BY TCLEVELAND 07/14/2017 AT 5:37AM    Klebsiella oxytoca NOT DETECTED NOT DETECTED Final   Klebsiella pneumoniae NOT DETECTED NOT DETECTED Final   Proteus species NOT DETECTED NOT DETECTED Final   Serratia marcescens NOT DETECTED NOT DETECTED Final   Carbapenem resistance NOT DETECTED NOT DETECTED Final   Haemophilus influenzae NOT DETECTED NOT DETECTED Final   Neisseria meningitidis NOT DETECTED NOT DETECTED Final   Pseudomonas aeruginosa NOT DETECTED NOT DETECTED Final   Candida albicans NOT DETECTED NOT DETECTED Final   Candida glabrata NOT DETECTED NOT DETECTED Final   Candida krusei NOT DETECTED NOT DETECTED Final   Candida parapsilosis NOT DETECTED NOT DETECTED Final   Candida tropicalis NOT DETECTED NOT DETECTED Final    Comment: Performed at [facility], [place] [address], [place], [place] [address]  Urine Culture     Status: Abnormal   Collection Time: 07/13/17  3:07 PM  Result Value Ref Range  Status   Specimen Description   Final    CYSTOSCOPY URINE Performed at [facility] [address], [place], [place] [address]    Special Requests   Final    NONE Performed at [facility], [place] [address], [place], [place] [address]    Culture MULTIPLE SPECIES PRESENT, SUGGEST RECOLLECTION (A)  Final   Report Status 07/15/2017 FINAL  Final  MRSA PCR Screening     Status: None   Collection Time: 07/13/17  5:00 PM  Result Value Ref Range Status   MRSA by PCR NEGATIVE NEGATIVE Final    Comment:        The GeneXpert MRSA Assay (FDA approved for NASAL specimens only), is one component of a comprehensive MRSA colonization surveillance program. It is not intended to diagnose MRSA infection nor to guide or monitor treatment for MRSA infections. Performed at [facility], [place] [address], [place], [place] [address]   Culture, blood (routine x 2)     Status: None (Preliminary result)   Collection Time: 07/15/17  4:43 AM  Result Value Ref Range Status   Specimen Description   Final    BLOOD RIGHT ANTECUBITAL Performed at [facility] [address] [place], [place] [address]    Special Requests   Final    BOTTLES DRAWN AEROBIC AND ANAEROBIC Blood Culture adequate volume Performed at [place] [address] [place], [place] [address]    Culture   Final    NO GROWTH 2 DAYS Performed at [facility] [address], [place], [place] [address]    Report Status PENDING  Incomplete  Culture, blood (routine x 2)     Status: None (Preliminary result)   Collection Time: 07/15/17  4:53 AM  Result Value Ref Range Status   Specimen Description   Final    BLOOD RIGHT FOREARM Performed at [facility] [address], [place], [place] [address]    Special Requests   Final    BOTTLES DRAWN AEROBIC ONLY Blood Culture adequate volume Performed at [facility] [address] [place], [place] [address]    Culture   Final    NO GROWTH 2 DAYS Performed at [facility] [address], [place], [place] [address]    Report Status PENDING  Incomplete      Radiology Studies: Dg Abd Portable 1v  Result Date: 07/16/2017 CLINICAL DATA:  Abdominal distention and pain EXAM: PORTABLE ABDOMEN - 1 VIEW COMPARISON:  07/13/2017 FINDINGS: Scattered large and small bowel gas is noted. No obstructive changes are seen. A left ureteral stent is noted in satisfactory position. Degenerative changes of the lumbar spine are noted. No other focal abnormality is seen. IMPRESSION: No acute abnormality noted. Left ureteral stent in satisfactory position. Electronically Signed   By: Inez Catalina M.D.   On: 07/16/2017 14:56     Scheduled Meds: . aspirin  325 mg Oral Daily  . escitalopram  10 mg Oral Daily  . fentaNYL  50 mcg Transdermal Q72H  . heparin  5,000 Units Subcutaneous Q8H  . hydrALAZINE  25 mg Oral Q6H  . levothyroxine  75 mcg Oral QAC breakfast  . multivitamin with minerals  1 tablet Oral Daily  . nebivolol  5 mg Oral Daily   Continuous Infusions: . cefTRIAXone (ROCEPHIN)  IV Stopped (07/17/17 1352)     LOS: 4 days    Time spent: 30 min    Janece Canterbury, MD [facility] Pager [phone]  If 7PM-7AM, please contact night-coverage [email or web address] Password [email or web address] 07/17/2017, 7:02 PM

## 2017-07-17 NOTE — Progress Notes (Signed)
Assumed care from previous nurses. Agree with previous nurses assessment. Will continue to monitor.

## 2017-07-17 NOTE — Progress Notes (Signed)
Patient ID: Alexandra Alexander, female   DOB: September 01, 1921, 84 y.o.   MRN: [id number]  I have Mrs. Kong scheduled on Tuesday February 12th for left ureteroscopic stone extraction.

## 2017-07-18 ENCOUNTER — Other Ambulatory Visit: Payer: Self-pay

## 2017-07-18 DIAGNOSIS — N201 Calculus of ureter: Secondary | ICD-10-CM

## 2017-07-18 DIAGNOSIS — R001 Bradycardia, unspecified: Secondary | ICD-10-CM

## 2017-07-18 DIAGNOSIS — E039 Hypothyroidism, unspecified: Secondary | ICD-10-CM

## 2017-07-18 LAB — COMPREHENSIVE METABOLIC PANEL
ALK PHOS: 74 U/L (ref 38–126)
ALT: 28 U/L (ref 14–54)
AST: 26 U/L (ref 15–41)
Albumin: 2.6 g/dL — ABNORMAL LOW (ref 3.5–5.0)
Anion gap: 7 (ref 5–15)
BUN: 24 mg/dL — ABNORMAL HIGH (ref 6–20)
CALCIUM: 8.1 mg/dL — AB (ref 8.9–10.3)
CO2: 25 mmol/L (ref 22–32)
CREATININE: 1.19 mg/dL — AB (ref 0.44–1.00)
Chloride: 106 mmol/L (ref 101–111)
GFR calc non Af Amer: 38 mL/min — ABNORMAL LOW (ref >60)
GFR, EST AFRICAN AMERICAN: 44 mL/min — AB (ref >60)
Glucose, Bld: 162 mg/dL — ABNORMAL HIGH (ref 65–99)
Potassium: 3.8 mmol/L (ref 3.5–5.1)
SODIUM: 138 mmol/L (ref 135–145)
Total Bilirubin: 0.3 mg/dL (ref 0.3–1.2)
Total Protein: 5.2 g/dL — ABNORMAL LOW (ref 6.5–8.1)

## 2017-07-18 LAB — CBC
HCT: 27.9 % — ABNORMAL LOW (ref 36.0–46.0)
HEMOGLOBIN: 9.4 g/dL — AB (ref 12.0–15.0)
MCH: 30.1 pg (ref 26.0–34.0)
MCHC: 33.7 g/dL (ref 30.0–36.0)
MCV: 89.4 fL (ref 78.0–100.0)
Platelets: 134 10*3/uL — ABNORMAL LOW (ref 150–400)
RBC: 3.12 MIL/uL — AB (ref 3.87–5.11)
RDW: 14.7 % (ref 11.5–15.5)
WBC: 8.7 10*3/uL (ref 4.0–10.5)

## 2017-07-18 LAB — LIPASE, BLOOD: Lipase: 37 U/L (ref 11–51)

## 2017-07-18 LAB — TROPONIN I
Troponin I: 0.04 ng/mL
Troponin I: 0.04 ng/mL

## 2017-07-18 MED ORDER — HYDRALAZINE HCL 50 MG PO TABS
50.0000 mg | ORAL_TABLET | Freq: Four times a day (QID) | ORAL | Status: DC
  Administered 2017-07-18 – 2017-07-19 (×4): 50 mg via ORAL
  Filled 2017-07-18 (×4): qty 1

## 2017-07-18 MED ORDER — CEFDINIR 300 MG PO CAPS: 300.0000 mg | ORAL_CAPSULE | Freq: Two times a day (BID) | ORAL | 0 refills | Status: AC

## 2017-07-18 MED ORDER — ASPIRIN EC 81 MG PO TBEC: 81.0000 mg | DELAYED_RELEASE_TABLET | Freq: Every day | ORAL | 0 refills | Status: AC

## 2017-07-18 MED ORDER — LOSARTAN POTASSIUM 50 MG PO TABS
100.0000 mg | ORAL_TABLET | Freq: Every day | ORAL | Status: DC
  Administered 2017-07-18 – 2017-07-19 (×2): 100 mg via ORAL
  Filled 2017-07-18 (×2): qty 2

## 2017-07-18 MED ORDER — LUBIPROSTONE 8 MCG PO CAPS
8.0000 ug | ORAL_CAPSULE | Freq: Every day | ORAL | Status: DC
  Administered 2017-07-19: 8 ug via ORAL
  Filled 2017-07-18: qty 1

## 2017-07-18 NOTE — Progress Notes (Addendum)
PROGRESS NOTE  Alexandra Alexander  YJE:[id number] DOB: 09/06/1921 DOA: 07/13/2017 PCP: [name], MD  Brief Narrative:  Alexandra Alexander is a 84 y.o. female with a history of HTN, stage III CKD, chronic pain syndrome and UTI's who presented 2/3 with fever and abdominal pain found to be septic. Lactic acid was 9, Creatinine 2.3. Vancomycin and zosyn were administered and the patient admitted to the SDU. CT abdomen showed evidence of left pyelonephritis, nephrolithiasis and hydronephrosis and the patient underwent ureteral stenting 2/3. Troponins returned elevated at 0.34-0.35, trending downward in the absence of chest pain that, after discussion with cardiology, was felt to indicate demand ischemia. Blood cultures have grown E. coli, antibiotics narrowed to ceftriaxone. Repeat blood cultures drawn 2/5. Pt has defervesced.  Developed diarrhea on 2/6, but was on laxative.  Stools slowed by holding laxative, but now no BM in 2 days.  Still having some hematuria  Had an episode of bradycardia to the 30s this morning with agitation that resolved. Her beta blocker has been held.  Blood pressures are rising.  Will observe overnight and if HR remains in 40s or higher (no more episodes of bradycardia), stable for discharge to SNF tomorrow.     Assessment & Plan:  Sepsis due to E. coli bacteremia and left pyelonephritis: Lactic acid trending downward.  Sepsis physiology resolved. -  Continue ceftriaxone and plan to transition cefdinir at discharge - Repeat blood cultures 2/5 NGTD  Left obstructive nephrolithiasis: s/p stenting by Dr. Jeffie Pollock 2/3.  Still having some hematuria.    - plan for stone extraction on Tues Feb 12 by Dr. Jeffie Pollock  AKI on CKD stage III: Due to obstructive uropathy and sepsis. SCr 2.39 on admission, now back to baseline for several days.    Diarrhea and abdominal pain, may be related to pyelonephritis, stone and stent.  Diarrhea has resolved but now no stools in 2 days. -  Resume  amitiza -  PVR with insignificant urine -  KUB:  No obstruction -  LFTs and lipase wnl -  Regular diet   Chronic pain syndrome:  - continue fentanyl 62mcg patch (home dose) -  Add prn fentanyl  Episode of agitation/delirium and bradycardia to 30s this morning -  D/c beta blocker -  Cycle troponins -  ECG  Troponin elevation: Most consistent with demand ischemia in setting of sepsis and possible ischemic event in the past. No wall motion abnormalities on echo, EF preserved, no diastolic dysfunction noted and no vegetations noted. - Continue ASA, continue beta blocker. Not on statin.  - Frequent PACs/PVCs  HTN -  D/c beta blocker -  Increase hydralazine -  Resume ARB  Hypothyroidism: TSH 2.012 - Continue synthroid  Leukocytosis due to stone and UTI, resolved  Normocytic anemia, likely due to acute illness, hemoglobin appears to be stabilizing near 9.3g/dl.  DVT prophylaxis: Heparin Code Status: DNR Family Communication: Daughter at bedside Disposition Plan: Planning discharge to SNF tomorrow if HR stable    Consultants:   Urology   Procedures:  Left ureteral stent placed on 2/3  Antimicrobials:  Anti-infectives (From admission, onward)   Start     Dose/Rate Route Frequency Ordered Stop   07/14/17 1200  cefTRIAXone (ROCEPHIN) 2 g in dextrose 5 % 50 mL IVPB     2 g 100 mL/hr over 30 Minutes Intravenous Every 24 hours 07/14/17 0824     07/13/17 2100  piperacillin-tazobactam (ZOSYN) IVPB 2.25 g  Status:  Discontinued     2.25  g 100 mL/hr over 30 Minutes Intravenous Every 8 hours 07/13/17 1520 07/14/17 0824   07/13/17 1215  piperacillin-tazobactam (ZOSYN) IVPB 3.375 g     3.375 g 100 mL/hr over 30 Minutes Intravenous  Once 07/13/17 1210 07/13/17 1325   07/13/17 0930  levofloxacin (LEVAQUIN) IVPB 750 mg     750 mg 100 mL/hr over 90 Minutes Intravenous  Once 07/13/17 0925 07/13/17 1138   07/13/17 0930  aztreonam (AZACTAM) 2 g in dextrose 5 % 50 mL IVPB     2  g 100 mL/hr over 30 Minutes Intravenous  Once 07/13/17 0925 07/13/17 1059   07/13/17 0930  vancomycin (VANCOCIN) IVPB 1000 mg/200 mL premix  Status:  Discontinued     1,000 mg 200 mL/hr over 60 Minutes Intravenous  Once 07/13/17 0925 07/13/17 0926       Subjective:  Had episode of agitation this morning with bradycardia to the low 30s.  Patient denied chest pains or difficulty breathing at the time and her blood pressure per RN remained stable.  No diarrhea.  No stools since a day ago.  Intermittent abdominal pains with nausea seem to be improving.    Objective: Vitals:   07/18/17 0043 07/18/17 0556 07/18/17 0753 07/18/17 1519  BP: (!) 159/80 (!) 177/80 (!) 156/78 (!) 177/72  Pulse:  68 (!) 56 (!) 50  Resp:  18 20 20   Temp:  98.1 F (36.7 C)  98.7 F (37.1 C)  TempSrc:  Oral  Oral  SpO2:  94% 96% 97%  Weight:      Height:        Intake/Output Summary (Last 24 hours) at 07/18/2017 1545 Last data filed at 07/18/2017 1532 Gross per 24 hour  Intake 200 ml  Output 200 ml  Net 0 ml   Filed Weights   07/13/17 0920 07/13/17 2000  Weight: 56.7 kg (125 lb) 57.8 kg (127 lb 6.8 oz)    Examination:  General exam:  Adult female.  No acute distress.  HEENT:  NCAT, MMM Respiratory system: Clear to auscultation bilaterally Cardiovascular system: Regular rate and rhythm, normal S1/S2. No murmurs, rubs, gallops or clicks.  Warm extremities Gastrointestinal system: Normal active bowel sounds, soft, nondistended, nontender. MSK:  Normal tone and bulk, no lower extremity edema Neuro:  Grossly intact Psych:  Pleasantly confused this morning during my visit  Data Reviewed: I have personally reviewed following labs and imaging studies  CBC: Recent Labs  Lab 07/13/17 0915 07/14/17 0004 07/16/17 0418 07/17/17 0831 07/18/17 1104  WBC 9.2 25.0* 20.7* 11.4* 8.7  NEUTROABS 8.7* 22.4*  --   --   --   HGB 12.0 9.3* 9.3* 9.9* 9.4*  HCT 34.9* 26.6* 27.7* 29.6* 27.9*  MCV 88.6 88.4 89.1 90.8  89.4  PLT 107* 132* 123* 119* 518*   Basic Metabolic Panel: Recent Labs  Lab 07/14/17 0154 07/15/17 0445 07/16/17 0418 07/17/17 0831 07/18/17 1104  NA 141 144 142 140 138  K 4.2 3.6 3.8 3.7 3.8  CL 110 113* 114* 109 106  CO2 23 23 23 27 25   GLUCOSE 120* 101* 97 99 162*  BUN 52* 41* 39* 29* 24*  CREATININE 2.54* 1.74* 1.36* 1.24* 1.19*  CALCIUM 7.9* 8.2* 8.2* 8.4* 8.1*   GFR: Estimated Creatinine Clearance: 23.4 mL/min (A) (by C-G formula based on SCr of 1.19 mg/dL (H)). Liver Function Tests: Recent Labs  Lab 07/13/17 0915 07/14/17 0154 07/18/17 1104  AST 92* 55* 26  ALT 57* 38 28  ALKPHOS 167* 64  74  BILITOT 0.8 0.7 0.3  PROT 6.7 5.3* 5.2*  ALBUMIN 3.7 2.7* 2.6*   Recent Labs  Lab 07/18/17 1104  LIPASE 37   No results for input(s): AMMONIA in the last 168 hours. Coagulation Profile: No results for input(s): INR, PROTIME in the last 168 hours. Cardiac Enzymes: Recent Labs  Lab 07/13/17 1225 07/13/17 1833 07/14/17 0004  TROPONINI 0.34* 0.35* 0.26*   BNP (last 3 results) No results for input(s): PROBNP in the last 8760 hours. HbA1C: No results for input(s): HGBA1C in the last 72 hours. CBG: No results for input(s): GLUCAP in the last 168 hours. Lipid Profile: No results for input(s): CHOL, HDL, LDLCALC, TRIG, CHOLHDL, LDLDIRECT in the last 72 hours. Thyroid Function Tests: No results for input(s): TSH, T4TOTAL, FREET4, T3FREE, THYROIDAB in the last 72 hours. Anemia Panel: No results for input(s): VITAMINB12, FOLATE, FERRITIN, TIBC, IRON, RETICCTPCT in the last 72 hours. Urine analysis:    Component Value Date/Time   COLORURINE YELLOW 07/13/2017 1140   APPEARANCEUR CLOUDY (A) 07/13/2017 1140   LABSPEC 1.012 07/13/2017 1140   PHURINE 5.0 07/13/2017 1140   GLUCOSEU NEGATIVE 07/13/2017 1140   HGBUR LARGE (A) 07/13/2017 1140   BILIRUBINUR NEGATIVE 07/13/2017 1140   KETONESUR NEGATIVE 07/13/2017 1140   PROTEINUR 30 (A) 07/13/2017 1140   NITRITE  POSITIVE (A) 07/13/2017 1140   LEUKOCYTESUR MODERATE (A) 07/13/2017 1140   Sepsis Labs: @LABRCNTIP (procalcitonin:4,lacticidven:4)  ) Recent Results (from the past 240 hour(s))  Blood Culture (routine x 2)     Status: Abnormal   Collection Time: 07/13/17  9:20 AM  Result Value Ref Range Status   Specimen Description BLOOD RIGHT ARM  Final   Special Requests   Final    BOTTLES DRAWN AEROBIC AND ANAEROBIC Blood Culture adequate volume   Culture  Setup Time   Final    IN BOTH AEROBIC AND ANAEROBIC BOTTLES GRAM NEGATIVE RODS CRITICAL VALUE NOTED.  VALUE IS CONSISTENT WITH PREVIOUSLY REPORTED AND CALLED VALUE.    Culture (A)  Final    ESCHERICHIA COLI SUSCEPTIBILITIES PERFORMED ON PREVIOUS CULTURE WITHIN THE LAST 5 DAYS. Performed at [facility], [place] [address], [place], [place] [address]    Report Status 07/16/2017 FINAL  Final  Blood Culture (routine x 2)     Status: Abnormal   Collection Time: 07/13/17 10:15 AM  Result Value Ref Range Status   Specimen Description BLOOD RIGHT ARM  Final   Special Requests   Final    BOTTLES DRAWN AEROBIC AND ANAEROBIC Blood Culture adequate volume   Culture  Setup Time   Final    IN BOTH AEROBIC AND ANAEROBIC BOTTLES GRAM NEGATIVE RODS CRITICAL RESULT CALLED TO, READ BACK BY AND VERIFIED WITH: JGRIMSLEY(PHARMD) BY TCLEVELAND 07/14/2017 AT 5:37AM Performed at [facility], [place] [address] [place], [place] [address]    Culture ESCHERICHIA COLI (A)  Final   Report Status 07/16/2017 FINAL  Final   Organism ID, Bacteria ESCHERICHIA COLI  Final      Susceptibility   Escherichia coli - MIC*    AMPICILLIN >=32 RESISTANT Resistant     CEFAZOLIN 8 SENSITIVE Sensitive     CEFEPIME <=1 SENSITIVE Sensitive     CEFTAZIDIME <=1 SENSITIVE Sensitive     CEFTRIAXONE <=1 SENSITIVE Sensitive     CIPROFLOXACIN <=0.25 SENSITIVE Sensitive     GENTAMICIN <=1 SENSITIVE Sensitive     IMIPENEM <=0.25 SENSITIVE Sensitive     TRIMETH/SULFA <=20 SENSITIVE  Sensitive  AMPICILLIN/SULBACTAM >=32 RESISTANT Resistant     PIP/TAZO <=4 SENSITIVE Sensitive     Extended ESBL NEGATIVE Sensitive     * ESCHERICHIA COLI  Blood Culture ID Panel (Reflexed)     Status: Abnormal   Collection Time: 07/13/17 10:15 AM  Result Value Ref Range Status   Enterococcus species NOT DETECTED NOT DETECTED Final   Listeria monocytogenes NOT DETECTED NOT DETECTED Final   Staphylococcus species NOT DETECTED NOT DETECTED Final   Staphylococcus aureus NOT DETECTED NOT DETECTED Final   Streptococcus species NOT DETECTED NOT DETECTED Final   Streptococcus agalactiae NOT DETECTED NOT DETECTED Final   Streptococcus pneumoniae NOT DETECTED NOT DETECTED Final   Streptococcus pyogenes NOT DETECTED NOT DETECTED Final   Acinetobacter baumannii NOT DETECTED NOT DETECTED Final   Enterobacteriaceae species DETECTED (A) NOT DETECTED Final    Comment: Enterobacteriaceae represent a large family of gram-negative bacteria, not a single organism. CRITICAL RESULT CALLED TO, READ BACK BY AND VERIFIED WITH: JGRIMSLEY(PHARMD) BY TCLEVELAND 07/14/2017 AT 5:37AM    Enterobacter cloacae complex NOT DETECTED NOT DETECTED Final   Escherichia coli DETECTED (A) NOT DETECTED Final    Comment: CRITICAL RESULT CALLED TO, READ BACK BY AND VERIFIED WITH: JGRIMSLEY(PHARMD) BY TCLEVELAND 07/14/2017 AT 5:37AM    Klebsiella oxytoca NOT DETECTED NOT DETECTED Final   Klebsiella pneumoniae NOT DETECTED NOT DETECTED Final   Proteus species NOT DETECTED NOT DETECTED Final   Serratia marcescens NOT DETECTED NOT DETECTED Final   Carbapenem resistance NOT DETECTED NOT DETECTED Final   Haemophilus influenzae NOT DETECTED NOT DETECTED Final   Neisseria meningitidis NOT DETECTED NOT DETECTED Final   Pseudomonas aeruginosa NOT DETECTED NOT DETECTED Final   Candida albicans NOT DETECTED NOT DETECTED Final   Candida glabrata NOT DETECTED NOT DETECTED Final   Candida krusei NOT DETECTED NOT DETECTED Final   Candida  parapsilosis NOT DETECTED NOT DETECTED Final   Candida tropicalis NOT DETECTED NOT DETECTED Final    Comment: Performed at [facility], [place] [address] [place], [place] [address]  Urine Culture     Status: Abnormal   Collection Time: 07/13/17  3:07 PM  Result Value Ref Range Status   Specimen Description   Final    CYSTOSCOPY URINE Performed at [place] [address] [place], [place] [address]    Special Requests   Final    NONE Performed at [facility] [place] [address] [place], [place] [address]    Culture MULTIPLE SPECIES PRESENT, SUGGEST RECOLLECTION (A)  Final   Report Status 07/15/2017 FINAL  Final  MRSA PCR Screening     Status: None   Collection Time: 07/13/17  5:00 PM  Result Value Ref Range Status   MRSA by PCR NEGATIVE NEGATIVE Final    Comment:        The GeneXpert MRSA Assay (FDA approved for NASAL specimens only), is one component of a comprehensive MRSA colonization surveillance program. It is not intended to diagnose MRSA infection nor to guide or monitor treatment for MRSA infections. Performed at [facility], [place] [address] [place], [place] [address]   Culture, blood (routine x 2)     Status: None (Preliminary result)   Collection Time: 07/15/17  4:43 AM  Result Value Ref Range Status   Specimen Description   Final    BLOOD RIGHT ANTECUBITAL Performed at [place] [address], [place], [place] [address]    Special Requests   Final  BOTTLES DRAWN AEROBIC AND ANAEROBIC Blood Culture adequate volume Performed at [place] [address], [place], [place] [address]    Culture   Final    NO GROWTH 3 DAYS Performed at [facility], [place] [address] [place], [place] [address]    Report Status PENDING  Incomplete  Culture, blood (routine x 2)     Status: None (Preliminary result)   Collection Time: 07/15/17  4:53 AM  Result Value  Ref Range Status   Specimen Description   Final    BLOOD RIGHT FOREARM Performed at [place] [address] [place], [place] [address]    Special Requests   Final    BOTTLES DRAWN AEROBIC ONLY Blood Culture adequate volume Performed at [place] [address] [place], [place] [address]    Culture   Final    NO GROWTH 3 DAYS Performed at [facility], [place] [address] [place], [place] [address]    Report Status PENDING  Incomplete      Radiology Studies: No results found.   Scheduled Meds: . aspirin  325 mg Oral Daily  . escitalopram  10 mg Oral Daily  . fentaNYL  50 mcg Transdermal Q72H  . heparin  5,000 Units Subcutaneous Q8H  . hydrALAZINE  50 mg Oral Q6H  . levothyroxine  75 mcg Oral QAC breakfast  . [START ON 07/19/2017] lubiprostone  8 mcg Oral Q breakfast  . multivitamin with minerals  1 tablet Oral Daily   Continuous Infusions: . cefTRIAXone (ROCEPHIN)  IV Stopped (07/18/17 1532)     LOS: 5 days    Time spent: 30 min    Janece Canterbury, MD [facility] Pager [phone]  If 7PM-7AM, please contact night-coverage [email or web address] Password [facility] 07/18/2017, 3:45 PM

## 2017-07-18 NOTE — Discharge Summary (Addendum)
Physician Discharge Summary  Alexandra Alexander CZY:[id number] DOB: August 09, 1921 DOA: 07/13/2017  PCP: [name], MD  Admit date: 07/13/2017 Discharge date: 07/19/2017  Admitted From: home  Disposition:  SNF   Recommendations for Outpatient Follow-up:  1. Follow up with PCP in 1-2 weeks 2. Please obtain BMP/CBC in one week to follow up anemia and acute kidney injury, particularly after resuming her ARB 3. Follow up for stone extraction on 07/22/2017 with Dr. Jeffie Pollock, Urology 4. Continue cefdinir through 07/26/2017  Home Health:  Ongoing PT/OT  Equipment/Devices:  none  Discharge Condition:  Stable, improved CODE STATUS:  DNR  Diet recommendation:  regular   Brief/Interim Summary:  Alexandra Alexander U89 y.o.femalewith a history of HTN, stage III CKD, chronic pain syndrome and UTI's who presented 2/3 with fever and abdominal pain found to be septic. Lactic acid was 9, Creatinine 2.3. Vancomycin and zosyn were administered and the patient admitted to the SDU. CT abdomen showed evidence of left pyelonephritis, nephrolithiasis and hydronephrosis and the patient underwent ureteral stenting 2/3. Troponins returned elevated at 0.34-0.35, trending downward in the absence of chest pain that, after discussion with cardiology, was felt to indicate demand ischemia. Blood cultures have grown E. coli, antibiotics narrowed to ceftriaxone.Repeat blood cultures drawn 2/5. Pt has defervesced. Developed diarrhea on 2/6, but was on laxative.  Stools slowed by holding laxative, but now no BM in 2 days.  Still having some hematuria but this may linger given her recent stent and UTI.   She had an episode of bradycardia to the 30s this morning with agitation that resolved. Her beta blocker has been stopped and her HR trended up to the 60s.     Discharge Diagnoses:  Principal Problem:   Sepsis due to Escherichia coli (E. coli) (HCC) Active Problems:   Hypertension   AKI (acute kidney injury) ([place])   Stage 3  chronic kidney disease ([place])   Acute pyelonephritis   Left ureteral stone   Elevated troponin   Hypothyroidism   E coli bacteremia  Sepsis due to E. coli bacteremia and left pyelonephritis:  Lactic acidosis and sepsis physiology resolved.    -  Continue cefdinir to complete a 14-day course.  Last day on 07/26/2017.  -  Repeat blood cultures 2/5 NGTD  Left obstructive nephrolithiasis: s/p stenting by Dr. Jeffie Pollock 2/3.  Still having some hematuria but hemoglobin has been stable for several days. - Plan for stone extraction on Tues Feb 12 by Dr. Jeffie Pollock  AKI on CKD stage III: Due to obstructive uropathy and sepsis. SCr 2.39 on admission, now back to baseline of 1.2-1.3 for several days prior to discharge.    Diarrhea and abdominal pain, may be related to pyelonephritis, stone and stent.  Diarrhea has resolved but now no stools in 2 days. -  Resume amitiza -  PVR with insignificant urine -  KUB:  No obstruction -  LFTs and lipase wnl - Tolerated regular diet   Chronic pain syndrome:  -  Continue fentanyl 21mcg patch (home dose)  Episode of agitation/delirium and bradycardia to 30s the morning of 2/8 -  D/c'd beta blocker and HR trended up to the 60s -  Troponins flat 0.04 and stable -  ECG with RBBB, similar to prior  Troponin elevation: Most consistent with demand ischemia in setting of sepsis and possible ischemic event in the past.No wall motion abnormalities on echo, EF preserved, no diastolic dysfunction noted and no vegetations noted. - Continue ASA. Not on statin given age, memory  problems, frailty.  Beta blocker held due to bradycardia. - Frequent PACs/PVCs -  Follow up with cardiology   HTN, blood pressures labile.   -  D/c beta blocker due to bradycardia -  Increased hydralazine to 100mg  TID  -  Resumed ARB on 2/8   Hypothyroidism: TSH 2.012 - Continue synthroid  Leukocytosis due to stone and UTI, resolved  Normocytic anemia, likely due to acute illness,  hemoglobin stabilizing near 9 g/dl.   Discharge Instructions   Allergies as of 07/19/2017      Reactions   Codeine Hives, Itching   Vancomycin Itching   Made the patient "feel like she was crawling out of her skin"      Medication List    STOP taking these medications   doxycycline 100 MG capsule Commonly known as:  VIBRAMYCIN   nebivolol 10 MG tablet Commonly known as:  BYSTOLIC     TAKE these medications   acetaminophen 650 MG CR tablet Commonly known as:  TYLENOL Take 650 mg by mouth 2 (two) times daily.   aspirin EC 81 MG tablet Take 1 tablet (81 mg total) by mouth daily.   cefdinir 300 MG capsule Commonly known as:  OMNICEF Take 1 capsule (300 mg total) by mouth 2 (two) times daily for 8 days.   CENTRUM SILVER 50+WOMEN Tabs Take 1 tablet by mouth daily.   escitalopram 10 MG tablet Commonly known as:  LEXAPRO Take 10 mg by mouth daily.   fentaNYL 50 MCG/HR Commonly known as:  DURAGESIC - dosed mcg/hr Place 1 patch (50 mcg total) onto the skin every 3 (three) days. REMOVE OLD ONE PRIOR TO APPLYING NEW ONE   Fish Oil 1000 MG Caps Take 1,000 mg by mouth every evening.   hydrALAZINE 100 MG tablet Commonly known as:  APRESOLINE Take 1 tablet (100 mg total) by mouth 3 (three) times daily.   levothyroxine 75 MCG tablet Commonly known as:  SYNTHROID, LEVOTHROID Take 75 mcg by mouth daily before breakfast.   losartan 100 MG tablet Commonly known as:  COZAAR Take 100 mg by mouth daily.   lubiprostone 8 MCG capsule Commonly known as:  AMITIZA Take 8 mcg by mouth daily.   Lutein-Zeaxanthin 20-1 MG Caps Take 1 capsule by mouth daily.   polyethylene glycol packet Commonly known as:  MIRALAX / GLYCOLAX Take 17 g by mouth daily as needed for mild constipation.      Follow-up Information    Irine Seal, MD. Go on 07/22/2017.   Specialty:  Urology Why:  For stone extraction.   Contact information: [place] [address] [phone]         [name], MD. Schedule an appointment as soon as possible for a visit in 2 week(s).   Specialty:  Internal Medicine Contact information: [place] [address] [phone]        [facility] [place] [facility] Follow up.   Specialty:  Cardiology Contact information: [address], [place] [address] [phone]         Allergies  Allergen Reactions  . Codeine Hives and Itching  . Vancomycin Itching    Made the patient "feel like she was crawling out of her skin"    Consultations:  Urology     Procedures/Studies: Ct Abdomen Pelvis Wo Contrast  Result Date: 07/13/2017 CLINICAL DATA:  Fever, abdominal pain, nausea EXAM: CT ABDOMEN AND PELVIS WITHOUT CONTRAST TECHNIQUE: Multidetector CT imaging of the abdomen and  pelvis was performed following the standard protocol without IV contrast. COMPARISON:  None. FINDINGS: Motion degraded images. Evaluation of the upper abdomen is also limited due to streak artifact from the patient's arms. Lower chest: Lung bases are clear. Hepatobiliary: Unenhanced liver is grossly unremarkable. Gallbladder is grossly unremarkable. No intrahepatic or extrahepatic ductal dilatation. Pancreas: Grossly unremarkable. Spleen: Within normal limits. Adrenals/Urinary Tract: Adrenal glands are grossly unremarkable. Right kidney is within normal limits. Left kidney is poorly evaluated due to motion degradation but mild left hydronephrosis with perinephric fluid/stranding is suspected. Associated 4 mm distal left ureteral calculus (series 2/image 58). Bladder is within normal limits. Stomach/Bowel: Stomach is grossly unremarkable. No evidence of bowel obstruction. Appendix is not discretely visualized. Vascular/Lymphatic: No evidence of abdominal aortic aneurysm. Atherosclerotic calcifications of the abdominal aorta and branch vessels. No suspicious abdominopelvic lymphadenopathy. Reproductive: Status post  hysterectomy. No adnexal masses. Other: No abdominopelvic ascites. Musculoskeletal: Degenerative changes of the visualized thoracolumbar spine. Lumbar levoscoliosis. IMPRESSION: Limited evaluation due to motion degradation and streak artifact. 4 mm distal left ureteral calculus. Suspected associated mild left hydronephrosis. Electronically Signed   By: Julian Hy M.D.   On: 07/13/2017 12:17   Dg Chest Port 1 View  Result Date: 07/13/2017 CLINICAL DATA:  Confusion, fever and weakness. EXAM: PORTABLE CHEST 1 VIEW COMPARISON:  Chest CT 06/24/2014 FINDINGS: Enlarged cardiac silhouette. Calcific atherosclerotic disease of the aorta and tortuosity. There is no evidence of focal airspace consolidation, pleural effusion or pneumothorax. Low lung volumes. Probable left pleural effusion. Osteolytic changes of the proximal left clavicle. Soft tissues are grossly normal. IMPRESSION: Probable left pleural effusion. Enlarged cardiac silhouette. Calcific atherosclerotic disease of the aorta. Osteolytic changes of the proximal left clavicle, at the site of previously demonstrated fracture. Please correlate clinically as osteomyelitis may have a similar appearance. Electronically Signed   By: Fidela Salisbury M.D.   On: 07/13/2017 10:06   Dg Abd Portable 1v  Result Date: 07/16/2017 CLINICAL DATA:  Abdominal distention and pain EXAM: PORTABLE ABDOMEN - 1 VIEW COMPARISON:  07/13/2017 FINDINGS: Scattered large and small bowel gas is noted. No obstructive changes are seen. A left ureteral stent is noted in satisfactory position. Degenerative changes of the lumbar spine are noted. No other focal abnormality is seen. IMPRESSION: No acute abnormality noted. Left ureteral stent in satisfactory position. Electronically Signed   By: Inez Catalina M.D.   On: 07/16/2017 14:56   Dg C-arm 1-60 Min-no Report  Result Date: 07/13/2017 Fluoroscopy was utilized by the requesting physician.  No radiographic interpretation.     Subjective:  Had a quiet night.  Has chronic abdominal discomfort and nausea, but eating well.  NO diarrhea.    Discharge Exam: Vitals:   07/19/17 0058 07/19/17 0443  BP: (!) 184/61 (!) 153/83  Pulse:  76  Resp:  20  Temp:  99.1 F (37.3 C)  SpO2:  95%   Vitals:   07/18/17 1815 07/18/17 2109 07/19/17 0058 07/19/17 0443  BP: (!) 143/71 (!) 156/66 (!) 184/61 (!) 153/83  Pulse:  67  76  Resp:  20  20  Temp:  98.3 F (36.8 C)  99.1 F (37.3 C)  TempSrc:  Oral  Oral  SpO2:  96%  95%  Weight:      Height:        General: Pt is alert, awake, not in acute distress Cardiovascular: RRR, S1/S2 +, no rubs, no gallops Respiratory: CTA bilaterally, no wheezing, no rhonchi Abdominal: Soft, mildly distended, minimally TTP diffusely without rebound  or guarding Extremities: no edema, no cyanosis Psych:  Awake, alert, pleasant    The results of significant diagnostics from this hospitalization (including imaging, microbiology, ancillary and laboratory) are listed below for reference.     Microbiology: Recent Results (from the past 240 hour(s))  Blood Culture (routine x 2)     Status: Abnormal   Collection Time: 07/13/17  9:20 AM  Result Value Ref Range Status   Specimen Description BLOOD RIGHT ARM  Final   Special Requests   Final    BOTTLES DRAWN AEROBIC AND ANAEROBIC Blood Culture adequate volume   Culture  Setup Time   Final    IN BOTH AEROBIC AND ANAEROBIC BOTTLES GRAM NEGATIVE RODS CRITICAL VALUE NOTED.  VALUE IS CONSISTENT WITH PREVIOUSLY REPORTED AND CALLED VALUE.    Culture (A)  Final    ESCHERICHIA COLI SUSCEPTIBILITIES PERFORMED ON PREVIOUS CULTURE WITHIN THE LAST 5 DAYS. Performed at [facility], [place] [address], [place], [place] [address]    Report Status 07/16/2017 FINAL  Final  Blood Culture (routine x 2)     Status: Abnormal   Collection Time: 07/13/17 10:15 AM  Result Value Ref Range Status   Specimen Description BLOOD RIGHT ARM  Final   Special  Requests   Final    BOTTLES DRAWN AEROBIC AND ANAEROBIC Blood Culture adequate volume   Culture  Setup Time   Final    IN BOTH AEROBIC AND ANAEROBIC BOTTLES GRAM NEGATIVE RODS CRITICAL RESULT CALLED TO, READ BACK BY AND VERIFIED WITH: JGRIMSLEY(PHARMD) BY TCLEVELAND 07/14/2017 AT 5:37AM Performed at [facility], [place] [address] [place], [place] [address]    Culture ESCHERICHIA COLI (A)  Final   Report Status 07/16/2017 FINAL  Final   Organism ID, Bacteria ESCHERICHIA COLI  Final      Susceptibility   Escherichia coli - MIC*    AMPICILLIN >=32 RESISTANT Resistant     CEFAZOLIN 8 SENSITIVE Sensitive     CEFEPIME <=1 SENSITIVE Sensitive     CEFTAZIDIME <=1 SENSITIVE Sensitive     CEFTRIAXONE <=1 SENSITIVE Sensitive     CIPROFLOXACIN <=0.25 SENSITIVE Sensitive     GENTAMICIN <=1 SENSITIVE Sensitive     IMIPENEM <=0.25 SENSITIVE Sensitive     TRIMETH/SULFA <=20 SENSITIVE Sensitive     AMPICILLIN/SULBACTAM >=32 RESISTANT Resistant     PIP/TAZO <=4 SENSITIVE Sensitive     Extended ESBL NEGATIVE Sensitive     * ESCHERICHIA COLI  Blood Culture ID Panel (Reflexed)     Status: Abnormal   Collection Time: 07/13/17 10:15 AM  Result Value Ref Range Status   Enterococcus species NOT DETECTED NOT DETECTED Final   Listeria monocytogenes NOT DETECTED NOT DETECTED Final   Staphylococcus species NOT DETECTED NOT DETECTED Final   Staphylococcus aureus NOT DETECTED NOT DETECTED Final   Streptococcus species NOT DETECTED NOT DETECTED Final   Streptococcus agalactiae NOT DETECTED NOT DETECTED Final   Streptococcus pneumoniae NOT DETECTED NOT DETECTED Final   Streptococcus pyogenes NOT DETECTED NOT DETECTED Final   Acinetobacter baumannii NOT DETECTED NOT DETECTED Final   Enterobacteriaceae species DETECTED (A) NOT DETECTED Final    Comment: Enterobacteriaceae represent a large family of gram-negative bacteria, not a single organism. CRITICAL RESULT CALLED TO, READ BACK BY AND VERIFIED  WITH: JGRIMSLEY(PHARMD) BY TCLEVELAND 07/14/2017 AT 5:37AM    Enterobacter cloacae complex NOT DETECTED NOT DETECTED Final   Escherichia coli DETECTED (A) NOT DETECTED Final    Comment: CRITICAL RESULT CALLED TO, READ BACK  BY AND VERIFIED WITH: JGRIMSLEY(PHARMD) BY TCLEVELAND 07/14/2017 AT 5:37AM    Klebsiella oxytoca NOT DETECTED NOT DETECTED Final   Klebsiella pneumoniae NOT DETECTED NOT DETECTED Final   Proteus species NOT DETECTED NOT DETECTED Final   Serratia marcescens NOT DETECTED NOT DETECTED Final   Carbapenem resistance NOT DETECTED NOT DETECTED Final   Haemophilus influenzae NOT DETECTED NOT DETECTED Final   Neisseria meningitidis NOT DETECTED NOT DETECTED Final   Pseudomonas aeruginosa NOT DETECTED NOT DETECTED Final   Candida albicans NOT DETECTED NOT DETECTED Final   Candida glabrata NOT DETECTED NOT DETECTED Final   Candida krusei NOT DETECTED NOT DETECTED Final   Candida parapsilosis NOT DETECTED NOT DETECTED Final   Candida tropicalis NOT DETECTED NOT DETECTED Final    Comment: Performed at [facility], [place] [address] [place], [place] [address]  Urine Culture     Status: Abnormal   Collection Time: 07/13/17  3:07 PM  Result Value Ref Range Status   Specimen Description   Final    CYSTOSCOPY URINE Performed at [place] [address], [place], [place] [address]    Special Requests   Final    NONE Performed at [facility], [place] [address], [place], [place] [address]    Culture MULTIPLE SPECIES PRESENT, SUGGEST RECOLLECTION (A)  Final   Report Status 07/15/2017 FINAL  Final  MRSA PCR Screening     Status: None   Collection Time: 07/13/17  5:00 PM  Result Value Ref Range Status   MRSA by PCR NEGATIVE NEGATIVE Final    Comment:        The GeneXpert MRSA Assay (FDA approved for NASAL specimens only), is one component of a comprehensive MRSA colonization surveillance program. It is not intended to diagnose  MRSA infection nor to guide or monitor treatment for MRSA infections. Performed at [facility], [place] [address] [place], [place] [address]   Culture, blood (routine x 2)     Status: None (Preliminary result)   Collection Time: 07/15/17  4:43 AM  Result Value Ref Range Status   Specimen Description   Final    BLOOD RIGHT ANTECUBITAL Performed at [place] [address], [place], [place] [address]    Special Requests   Final    BOTTLES DRAWN AEROBIC AND ANAEROBIC Blood Culture adequate volume Performed at [place] [address], [place], [place] [address]    Culture   Final    NO GROWTH 3 DAYS Performed at [facility], [place] [address] [place], [place] [address]    Report Status PENDING  Incomplete  Culture, blood (routine x 2)     Status: None (Preliminary result)   Collection Time: 07/15/17  4:53 AM  Result Value Ref Range Status   Specimen Description   Final    BLOOD RIGHT FOREARM Performed at [place] [address], [place], [place] [address]    Special Requests   Final    BOTTLES DRAWN AEROBIC ONLY Blood Culture adequate volume Performed at [place] [address], [place], [place] [address]    Culture   Final    NO GROWTH 3 DAYS Performed at [facility], [place] [address] [place],  [address]    Report Status PENDING  Incomplete     Labs: BNP (last 3 results) No results for input(s): BNP in the last 8760 hours. Basic Metabolic Panel: Recent Labs  Lab 07/14/17 0154 07/15/17 0445 07/16/17 0418 07/17/17 0831 07/18/17 1104  NA 141 144 142 140 138  K 4.2 3.6 3.8 3.7 3.8  CL 110 113* 114* 109 106  CO2 23 23 23 27 25   GLUCOSE 120* 101* 97 99 162*  BUN 52* 41* 39* 29* 24*  CREATININE 2.54* 1.74* 1.36* 1.24* 1.19*  CALCIUM 7.9* 8.2* 8.2* 8.4* 8.1*   Liver Function Tests: Recent Labs  Lab 07/13/17 0915 07/14/17 0154  07/18/17 1104  AST 92* 55* 26  ALT 57* 38 28  ALKPHOS 167* 64 74  BILITOT 0.8 0.7 0.3  PROT 6.7 5.3* 5.2*  ALBUMIN 3.7 2.7* 2.6*   Recent Labs  Lab 07/18/17 1104  LIPASE 37   No results for input(s): AMMONIA in the last 168 hours. CBC: Recent Labs  Lab 07/13/17 0915 07/14/17 0004 07/16/17 0418 07/17/17 0831 07/18/17 1104  WBC 9.2 25.0* 20.7* 11.4* 8.7  NEUTROABS 8.7* 22.4*  --   --   --   HGB 12.0 9.3* 9.3* 9.9* 9.4*  HCT 34.9* 26.6* 27.7* 29.6* 27.9*  MCV 88.6 88.4 89.1 90.8 89.4  PLT 107* 132* 123* 119* 134*   Cardiac Enzymes: Recent Labs  Lab 07/13/17 1833 07/14/17 0004 07/18/17 1556 07/18/17 2153 07/19/17 0337  TROPONINI 0.35* 0.26* 0.04* 0.04* 0.04*   BNP: Invalid input(s): POCBNP CBG: No results for input(s): GLUCAP in the last 168 hours. D-Dimer No results for input(s): DDIMER in the last 72 hours. Hgb A1c No results for input(s): HGBA1C in the last 72 hours. Lipid Profile No results for input(s): CHOL, HDL, LDLCALC, TRIG, CHOLHDL, LDLDIRECT in the last 72 hours. Thyroid function studies No results for input(s): TSH, T4TOTAL, T3FREE, THYROIDAB in the last 72 hours.  Invalid input(s): FREET3 Anemia work up No results for input(s): VITAMINB12, FOLATE, FERRITIN, TIBC, IRON, RETICCTPCT in the last 72 hours. Urinalysis    Component Value Date/Time   COLORURINE YELLOW 07/13/2017 1140   APPEARANCEUR CLOUDY (A) 07/13/2017 1140   LABSPEC 1.012 07/13/2017 1140   PHURINE 5.0 07/13/2017 1140   GLUCOSEU NEGATIVE 07/13/2017 1140   HGBUR LARGE (A) 07/13/2017 1140   BILIRUBINUR NEGATIVE 07/13/2017 1140   KETONESUR NEGATIVE 07/13/2017 1140   PROTEINUR 30 (A) 07/13/2017 1140   NITRITE POSITIVE (A) 07/13/2017 1140   LEUKOCYTESUR MODERATE (A) 07/13/2017 1140   Sepsis Labs Invalid input(s): PROCALCITONIN,  WBC,  LACTICIDVEN   Time coordinating discharge: Over 30 minutes  SIGNED:   Janece Canterbury, MD  [facility] 07/19/2017, 9:56 AM Pager    If 7PM-7AM, please contact night-coverage [email or web address] Password [email or web address]

## 2017-07-19 DIAGNOSIS — N179 Acute kidney failure, unspecified: Secondary | ICD-10-CM | POA: Diagnosis not present

## 2017-07-19 DIAGNOSIS — N1330 Unspecified hydronephrosis: Secondary | ICD-10-CM | POA: Diagnosis not present

## 2017-07-19 DIAGNOSIS — Z853 Personal history of malignant neoplasm of breast: Secondary | ICD-10-CM | POA: Diagnosis not present

## 2017-07-19 DIAGNOSIS — Z881 Allergy status to other antibiotic agents status: Secondary | ICD-10-CM | POA: Diagnosis not present

## 2017-07-19 DIAGNOSIS — N201 Calculus of ureter: Secondary | ICD-10-CM | POA: Diagnosis not present

## 2017-07-19 DIAGNOSIS — A4151 Sepsis due to Escherichia coli [E. coli]: Secondary | ICD-10-CM | POA: Diagnosis not present

## 2017-07-19 DIAGNOSIS — E039 Hypothyroidism, unspecified: Secondary | ICD-10-CM | POA: Diagnosis not present

## 2017-07-19 DIAGNOSIS — R110 Nausea: Secondary | ICD-10-CM | POA: Diagnosis not present

## 2017-07-19 DIAGNOSIS — R293 Abnormal posture: Secondary | ICD-10-CM | POA: Diagnosis not present

## 2017-07-19 DIAGNOSIS — M6281 Muscle weakness (generalized): Secondary | ICD-10-CM | POA: Diagnosis not present

## 2017-07-19 DIAGNOSIS — N12 Tubulo-interstitial nephritis, not specified as acute or chronic: Secondary | ICD-10-CM | POA: Diagnosis not present

## 2017-07-19 DIAGNOSIS — R410 Disorientation, unspecified: Secondary | ICD-10-CM | POA: Diagnosis not present

## 2017-07-19 DIAGNOSIS — Z87442 Personal history of urinary calculi: Secondary | ICD-10-CM | POA: Diagnosis not present

## 2017-07-19 DIAGNOSIS — N390 Urinary tract infection, site not specified: Secondary | ICD-10-CM | POA: Diagnosis not present

## 2017-07-19 DIAGNOSIS — R6520 Severe sepsis without septic shock: Secondary | ICD-10-CM | POA: Diagnosis not present

## 2017-07-19 DIAGNOSIS — R41841 Cognitive communication deficit: Secondary | ICD-10-CM | POA: Diagnosis not present

## 2017-07-19 DIAGNOSIS — A419 Sepsis, unspecified organism: Secondary | ICD-10-CM | POA: Diagnosis not present

## 2017-07-19 DIAGNOSIS — I10 Essential (primary) hypertension: Secondary | ICD-10-CM | POA: Diagnosis not present

## 2017-07-19 DIAGNOSIS — K219 Gastro-esophageal reflux disease without esophagitis: Secondary | ICD-10-CM | POA: Diagnosis not present

## 2017-07-19 DIAGNOSIS — R278 Other lack of coordination: Secondary | ICD-10-CM | POA: Diagnosis not present

## 2017-07-19 DIAGNOSIS — Z79899 Other long term (current) drug therapy: Secondary | ICD-10-CM | POA: Diagnosis not present

## 2017-07-19 DIAGNOSIS — R140 Abdominal distension (gaseous): Secondary | ICD-10-CM | POA: Diagnosis not present

## 2017-07-19 DIAGNOSIS — I120 Hypertensive chronic kidney disease with stage 5 chronic kidney disease or end stage renal disease: Secondary | ICD-10-CM | POA: Diagnosis not present

## 2017-07-19 DIAGNOSIS — I700 Atherosclerosis of aorta: Secondary | ICD-10-CM | POA: Diagnosis not present

## 2017-07-19 DIAGNOSIS — Z885 Allergy status to narcotic agent status: Secondary | ICD-10-CM | POA: Diagnosis not present

## 2017-07-19 DIAGNOSIS — R509 Fever, unspecified: Secondary | ICD-10-CM | POA: Diagnosis not present

## 2017-07-19 DIAGNOSIS — R1311 Dysphagia, oral phase: Secondary | ICD-10-CM | POA: Diagnosis not present

## 2017-07-19 DIAGNOSIS — K5900 Constipation, unspecified: Secondary | ICD-10-CM | POA: Diagnosis not present

## 2017-07-19 DIAGNOSIS — R2681 Unsteadiness on feet: Secondary | ICD-10-CM | POA: Diagnosis not present

## 2017-07-19 DIAGNOSIS — N183 Chronic kidney disease, stage 3 (moderate): Secondary | ICD-10-CM | POA: Diagnosis not present

## 2017-07-19 LAB — TROPONIN I: Troponin I: 0.04 ng/mL (ref <0.03)

## 2017-07-19 MED ORDER — HYDRALAZINE HCL 100 MG PO TABS: 100.0000 mg | ORAL_TABLET | Freq: Three times a day (TID) | ORAL | 0 refills | Status: DC

## 2017-07-19 MED ORDER — FENTANYL 50 MCG/HR TD PT72: 50.0000 ug | MEDICATED_PATCH | TRANSDERMAL | 0 refills | Status: DC

## 2017-07-19 NOTE — Progress Notes (Signed)
Patient D/C'd to [facility] in [place]. Report called to nurse Milinda Antis- all questions answered. AVS printed and sent with PTAR. Daughter & son-in-law present and aware. Daughter took pt's leg prosthesis. Prior to D/C, telemetry notified RN that patient's HR was again in the 30s. MD reviewed telemetry- HR was not true d/t BBB and not capturing every beat on the monitor. Pt was also asymptomatic- very alert at time her HR was supposedly in 42s. HR was actually in upper 50s, to 60s. Facility made aware of event.

## 2017-07-19 NOTE — Clinical Social Work Placement (Signed)
PAtient received and accepted bed offer at [facility] [place] [facility]. Facility aware of patient's discharge and confirmed bed offer. PTAR contacted, patient's family notified. Patient's RN can call report to [phone] Room 204, packet complete. CSW signing off, no other needs identified at this time.   CLINICAL SOCIAL WORK PLACEMENT  NOTE  Date:  07/19/2017  Patient Details  Name: Alexandra Alexander MRN: [id number] Date of Birth: 12/14/1921  Clinical Social Work is seeking post-discharge placement for this patient at the [place] level of care (*CSW will initial, date and re-position this form in  chart as items are completed):  Yes   Patient/family provided with [place] Work Department's list of facilities offering this level of care within the geographic area requested by the patient (or if unable, by the patient's family).  Yes   Patient/family informed of their freedom to choose among providers that offer the needed level of care, that participate in Medicare, Medicaid or managed care program needed by the patient, have an available bed and are willing to accept the patient.  Yes   Patient/family informed of [facility]'s ownership interest in [facility] and [facility], as well as of the fact that they are under no obligation to receive care at these facilities.  PASRR submitted to EDS on 07/15/17     PASRR number received on 07/15/17     Existing PASRR number confirmed on       FL2 transmitted to all facilities in geographic area requested by pt/family on 07/15/17     FL2 transmitted to all facilities within larger geographic area on       Patient informed that his/her managed care company has contracts with or will negotiate with certain facilities, including the following:        Yes   Patient/family informed of bed offers received.  Patient chooses bed at [place], [place]     Physician recommends and patient chooses bed at        Patient to be transferred to [place], [place] on 07/19/17.  Patient to be transferred to facility by PTAR     Patient family notified on 07/19/17 of transfer.  Name of family member notified:  Bobby Rumpf     PHYSICIAN       Additional Comment:    _______________________________________________ Burnis Medin, LCSW 07/19/2017, 10:58 AM

## 2017-07-20 DIAGNOSIS — I10 Essential (primary) hypertension: Secondary | ICD-10-CM | POA: Diagnosis not present

## 2017-07-20 DIAGNOSIS — N183 Chronic kidney disease, stage 3 (moderate): Secondary | ICD-10-CM | POA: Diagnosis not present

## 2017-07-20 DIAGNOSIS — A419 Sepsis, unspecified organism: Secondary | ICD-10-CM | POA: Diagnosis not present

## 2017-07-20 DIAGNOSIS — N12 Tubulo-interstitial nephritis, not specified as acute or chronic: Secondary | ICD-10-CM | POA: Diagnosis not present

## 2017-07-20 DIAGNOSIS — N1330 Unspecified hydronephrosis: Secondary | ICD-10-CM | POA: Diagnosis not present

## 2017-07-20 LAB — CULTURE, BLOOD (ROUTINE X 2)
Culture: NO GROWTH
Culture: NO GROWTH
SPECIAL REQUESTS: ADEQUATE
Special Requests: ADEQUATE

## 2017-07-21 ENCOUNTER — Other Ambulatory Visit: Payer: Self-pay | Admitting: Urology

## 2017-07-21 ENCOUNTER — Other Ambulatory Visit: Payer: Self-pay

## 2017-07-21 ENCOUNTER — Encounter (HOSPITAL_COMMUNITY): Payer: Self-pay | Admitting: *Deleted

## 2017-07-21 NOTE — Progress Notes (Signed)
[place] and requested current MAR to be faxed since surgery on 07/22/2017.

## 2017-07-21 NOTE — Progress Notes (Addendum)
Preop instructions for:  Alexandra Alexander                        Date of Birth      August 26, 1921                       Date of Procedure:   07/22/2017     Doctor: Dr Irine Seal  Time to arrive at [facility]:1030am  Report to: Admitting  Procedure: cysto, left ureteroscopy, possible stent exchange Any procedure time changes, MD office will notify you!   Do not eat or drink past midnight the night before your procedure.(To include any tube feedings-must be discontinued) Reminder:Follow bowel prep instructions per MD office!   Take these morning medications only with sips of water.(or give through gastrostomy or feeding tube). Hydralazine , Tylenol ,  Cefdinir , Lexapro , Synthroid  Note: No Insulin or Diabetic meds should be given or taken the morning of the procedure!   Facility contact:   Daughter- Alexandra Alexander [phone]               Phone:                  [place]:  Transportation contact phone#:PTAR- arranged by [facility] in [place]   Please send day of procedure:current med list and meds last taken that day, confirm nothing by mouth status from what time, Patient Demographic info( to include DNR status, problem list, allergies)   RN contact name/phone#: Gillian Shields RN                             and Fax #: [phone] Bring Insurance card and picture ID Leave all jewelry and other valuables at place where living( no metal or rings to be worn) No contact lens Women-no make-up, no lotions,perfumes,powders Men-no colognes,lotions  Any questions day of procedure,call Short Stay [phone]   Sent from :[facility]                   [place]                   [phone]  Sent by : Gillian Shields RN

## 2017-07-22 ENCOUNTER — Encounter (HOSPITAL_COMMUNITY): Payer: Self-pay

## 2017-07-22 ENCOUNTER — Encounter (HOSPITAL_COMMUNITY)
Admission: RE | Disposition: A | Discharge status: Skilled Nursing Facility | Payer: Self-pay | Source: Ambulatory Visit | Attending: Urology

## 2017-07-22 ENCOUNTER — Ambulatory Visit (HOSPITAL_COMMUNITY): Payer: Medicare Other | Admitting: Certified Registered Nurse Anesthetist

## 2017-07-22 ENCOUNTER — Ambulatory Visit (HOSPITAL_COMMUNITY): Payer: Medicare Other

## 2017-07-22 ENCOUNTER — Ambulatory Visit (HOSPITAL_COMMUNITY)
Admission: RE | Admit: 2017-07-22 | Discharge: 2017-07-22 | Disposition: A | Discharge status: Skilled Nursing Facility | Payer: Medicare Other | Source: Ambulatory Visit | Attending: Urology | Admitting: Urology

## 2017-07-22 DIAGNOSIS — N183 Chronic kidney disease, stage 3 (moderate): Secondary | ICD-10-CM | POA: Diagnosis not present

## 2017-07-22 DIAGNOSIS — I700 Atherosclerosis of aorta: Secondary | ICD-10-CM | POA: Diagnosis not present

## 2017-07-22 DIAGNOSIS — K219 Gastro-esophageal reflux disease without esophagitis: Secondary | ICD-10-CM | POA: Insufficient documentation

## 2017-07-22 DIAGNOSIS — Z881 Allergy status to other antibiotic agents status: Secondary | ICD-10-CM | POA: Diagnosis not present

## 2017-07-22 DIAGNOSIS — Z885 Allergy status to narcotic agent status: Secondary | ICD-10-CM | POA: Insufficient documentation

## 2017-07-22 DIAGNOSIS — N201 Calculus of ureter: Secondary | ICD-10-CM | POA: Insufficient documentation

## 2017-07-22 DIAGNOSIS — I10 Essential (primary) hypertension: Secondary | ICD-10-CM | POA: Insufficient documentation

## 2017-07-22 DIAGNOSIS — Z853 Personal history of malignant neoplasm of breast: Secondary | ICD-10-CM | POA: Insufficient documentation

## 2017-07-22 DIAGNOSIS — I120 Hypertensive chronic kidney disease with stage 5 chronic kidney disease or end stage renal disease: Secondary | ICD-10-CM | POA: Diagnosis not present

## 2017-07-22 DIAGNOSIS — E039 Hypothyroidism, unspecified: Secondary | ICD-10-CM | POA: Diagnosis not present

## 2017-07-22 DIAGNOSIS — Z79899 Other long term (current) drug therapy: Secondary | ICD-10-CM | POA: Diagnosis not present

## 2017-07-22 DIAGNOSIS — Z87442 Personal history of urinary calculi: Secondary | ICD-10-CM | POA: Diagnosis not present

## 2017-07-22 HISTORY — PX: CYSTOSCOPY/URETEROSCOPY/HOLMIUM LASER/STENT PLACEMENT: SHX6546

## 2017-07-22 SURGERY — CYSTOSCOPY/URETEROSCOPY/HOLMIUM LASER/STENT PLACEMENT
Anesthesia: General | Laterality: Left

## 2017-07-22 MED ORDER — FENTANYL CITRATE (PF) 100 MCG/2ML IJ SOLN: 25.0000 ug | INTRAMUSCULAR | Status: DC | PRN

## 2017-07-22 MED ORDER — LIDOCAINE 2% (20 MG/ML) 5 ML SYRINGE
INTRAMUSCULAR | Status: AC
  Filled 2017-07-22: qty 5

## 2017-07-22 MED ORDER — CEFAZOLIN SODIUM-DEXTROSE 2-4 GM/100ML-% IV SOLN
2.0000 g | INTRAVENOUS | Status: AC
  Administered 2017-07-22: 2 g via INTRAVENOUS
  Filled 2017-07-22: qty 100

## 2017-07-22 MED ORDER — PROPOFOL 10 MG/ML IV BOLUS
INTRAVENOUS | Status: AC
  Filled 2017-07-22: qty 20

## 2017-07-22 MED ORDER — LIDOCAINE HCL 1 % IJ SOLN
INTRAMUSCULAR | Status: DC | PRN
  Administered 2017-07-22 (×2): 100 mg via INTRADERMAL

## 2017-07-22 MED ORDER — SUCCINYLCHOLINE CHLORIDE 20 MG/ML IJ SOLN
INTRAMUSCULAR | Status: DC | PRN
  Administered 2017-07-22: 100 mg via INTRAVENOUS

## 2017-07-22 MED ORDER — PHENYLEPHRINE HCL 10 MG/ML IJ SOLN
INTRAMUSCULAR | Status: AC
  Filled 2017-07-22: qty 1

## 2017-07-22 MED ORDER — FENTANYL CITRATE (PF) 100 MCG/2ML IJ SOLN
INTRAMUSCULAR | Status: AC
  Filled 2017-07-22: qty 2

## 2017-07-22 MED ORDER — LACTATED RINGERS IV SOLN
INTRAVENOUS | Status: DC
  Administered 2017-07-22: 12:00:00 via INTRAVENOUS

## 2017-07-22 MED ORDER — PHENYLEPHRINE HCL 10 MG/ML IJ SOLN
INTRAMUSCULAR | Status: DC | PRN
  Administered 2017-07-22 (×3): 80 ug via INTRAVENOUS

## 2017-07-22 MED ORDER — ONDANSETRON HCL 4 MG/2ML IJ SOLN
INTRAMUSCULAR | Status: DC | PRN
  Administered 2017-07-22: 4 mg via INTRAVENOUS

## 2017-07-22 MED ORDER — EPHEDRINE SULFATE 50 MG/ML IJ SOLN
INTRAMUSCULAR | Status: DC | PRN
  Administered 2017-07-22: 10 mg via INTRAVENOUS
  Administered 2017-07-22: 15 mg via INTRAVENOUS
  Administered 2017-07-22: 10 mg via INTRAVENOUS
  Administered 2017-07-22: 15 mg via INTRAVENOUS

## 2017-07-22 MED ORDER — SODIUM CHLORIDE 0.9 % IR SOLN
Status: DC | PRN
  Administered 2017-07-22: 3000 mL

## 2017-07-22 MED ORDER — PROPOFOL 10 MG/ML IV BOLUS
INTRAVENOUS | Status: DC | PRN
  Administered 2017-07-22: 100 mg via INTRAVENOUS

## 2017-07-22 MED ORDER — SODIUM CHLORIDE 0.9 % IV SOLN
INTRAVENOUS | Status: DC | PRN
  Administered 2017-07-22: 50 ug/min via INTRAVENOUS

## 2017-07-22 SURGICAL SUPPLY — 22 items
BAG URO CATCHER STRL LF (MISCELLANEOUS) ×3 IMPLANT
BASKET STONE NCOMPASS (UROLOGICAL SUPPLIES) IMPLANT
CATH URET 5FR 28IN OPEN ENDED (CATHETERS) ×2 IMPLANT
CATH URET DUAL LUMEN 6-10FR 50 (CATHETERS) ×3 IMPLANT
CLOTH BEACON ORANGE TIMEOUT ST (SAFETY) ×3 IMPLANT
COVER FOOTSWITCH UNIV (MISCELLANEOUS) ×2 IMPLANT
COVER SURGICAL LIGHT HANDLE (MISCELLANEOUS) ×3 IMPLANT
EXTRACTOR STONE NITINOL NGAGE (UROLOGICAL SUPPLIES) ×5 IMPLANT
FIBER LASER FLEXIVA 365 (UROLOGICAL SUPPLIES) IMPLANT
FIBER LASER TRAC TIP (UROLOGICAL SUPPLIES) IMPLANT
GLOVE SURG SS PI 8.0 STRL IVOR (GLOVE) ×2 IMPLANT
GOWN STRL REUS W/TWL XL LVL3 (GOWN DISPOSABLE) ×3 IMPLANT
GUIDEWIRE STR DUAL SENSOR (WIRE) ×3 IMPLANT
IV NS 1000ML (IV SOLUTION) ×3
IV NS 1000ML BAXH (IV SOLUTION) ×1 IMPLANT
IV NS IRRIG 3000ML ARTHROMATIC (IV SOLUTION) ×3 IMPLANT
MANIFOLD NEPTUNE II (INSTRUMENTS) ×3 IMPLANT
PACK CYSTO (CUSTOM PROCEDURE TRAY) ×3 IMPLANT
SHEATH URETERAL 12FRX35CM (MISCELLANEOUS) ×3 IMPLANT
STENT URET 6FRX24 CONTOUR (STENTS) ×2 IMPLANT
TUBING CONNECTING 10 (TUBING) ×2 IMPLANT
TUBING CONNECTING 10' (TUBING) ×1

## 2017-07-22 NOTE — Anesthesia Procedure Notes (Addendum)
Procedure Name: Intubation Date/Time: 07/22/2017 1:40 PM Performed by: Colin Rhein, CRNA Pre-anesthesia Checklist: Patient identified, Emergency Drugs available, Suction available and Patient being monitored Patient Re-evaluated:Patient Re-evaluated prior to induction Oxygen Delivery Method: Circle system utilized Preoxygenation: Pre-oxygenation with 100% oxygen Induction Type: IV induction Ventilation: Mask ventilation without difficulty Laryngoscope Size: Mac and 4 Grade View: Grade I Tube type: Oral Tube size: 7.5 mm Number of attempts: 1 Airway Equipment and Method: Patient positioned with wedge pillow Placement Confirmation: ETT inserted through vocal cords under direct vision,  positive ETCO2,  CO2 detector and breath sounds checked- equal and bilateral Secured at: 21 cm Tube secured with: Tape Dental Injury: Teeth and Oropharynx as per pre-operative assessment

## 2017-07-22 NOTE — Op Note (Signed)
Procedure: 1.  Cystoscopy with removal of left double-J stent. 2.  Left ureteroscopy with holmium laser, stone manipulation and placement of left double-J stent.  Preop diagnosis: Left distal ureteral stone.  Postop diagnosis: Same.  Surgeon: Dr. Irine Seal.  Anesthesia: General.  Drain: 6 French by 24 cm contour left double-J stent with tether.  Specimen: Stone fragments.  EBL: Minimal.  Complications: None.  Indications: Alexandra Alexander is a 84 year old female who originally presented with sepsis and a 4 mm left distal ureteral stone.  She had been previously stented during that episode and returns today for stone removal.  Procedure: She was taken to the operating room where she was given antibiotic.  A general anesthetic was induced and she was placed in lithotomy position.  She was fitted with a right PAS hose.  She had a left BKA remotely.  Her perineum and genitalia were prepped with Betadine solution and she was draped in the usual sterile fashion.  Cystoscopy was performed using a 23 [place] scope and 30 degree lens.  She was noted to have a caruncle but the urethra was otherwise normal.  There was erythema from the stent in the left ureteral orifice but no other abnormalities were noted.  The stent loop was visualized at the left ureteral orifice and grasped with a forceps.  The stent was pulled to the urethral meatus.  A guidewire was then passed to the kidney and the stent was removed.  Initially the 6.5 French semirigid ureteroscope was passed but there was some narrowing that did not allow access to the level of the stone.  I switched to the 4.5 French semirigid ureteroscope and was able to access the stone.  An initial attempt to remove the stone with an engage basket was unsuccessful due to stone diameter mild stricturing in the ureter.  I then engaged the stone with a 364 m holmium laser fiber set on 1 W and 10 Hz.  The stone broke easily.  The fragments were then removed using an  engage basket.  Once all significant fragments were removed the ureteroscope was removed.  The cystoscope was reinserted over the wire and a 6 French 24 cm contour double-J stent with tether was passed to the kidney under fluoroscopic guidance.  The wire was removed leaving good coil in the kidney and a good coil in the bladder.  The cystoscope was removed after the bladder was drained and the stent string was left exiting the urethra.  The string was secured and patient identified after the drapes were removed.  She was taken down from lithotomy position mood recovery room in stable condition.  There were no complications.

## 2017-07-22 NOTE — Progress Notes (Signed)
Report given to Jim Like from [facility].  No questions at present time.  D/C instructions gone over as well and will be sent with pt's d/c paperwork.

## 2017-07-22 NOTE — Transfer of Care (Signed)
Immediate Anesthesia Transfer of Care Note  Patient: Alexandra Alexander  Procedure(s) Performed: CYSTOSCOPY LEFT URETEROSCOPY  POSSIBLE [place] LASER/STENT EXCHANGE (Left )  Patient Location: PACU  Anesthesia Type:General  Level of Consciousness: awake, alert  and oriented  Airway & Oxygen Therapy: Patient Spontanous Breathing and Patient connected to face mask oxygen  Post-op Assessment: Report given to RN and Post -op Vital signs reviewed and stable  Post vital signs: Reviewed and stable  Last Vitals:  Vitals:   07/22/17 1110 07/22/17 1417  BP: (!) 201/96 (!) 163/97  Pulse:  97  Resp:  12  Temp:  36.8 C  SpO2:  94%    Last Pain:  Vitals:   07/22/17 1109  TempSrc: Oral         Complications: No apparent anesthesia complications

## 2017-07-22 NOTE — Progress Notes (Signed)
Spoke with [place] Caryl Pina, RN, she states pt refused her meds today for 07/22/17.  So last meds would have been 07/21/17 and were noted on the [facility] that [facility] sent with pt.

## 2017-07-22 NOTE — Anesthesia Postprocedure Evaluation (Signed)
Anesthesia Post Note  Patient: OLUWATAMILORE STARNES  Procedure(s) Performed: CYSTOSCOPY LEFT URETEROSCOPY  POSSIBLE [place] LASER/STENT EXCHANGE (Left )     Patient location during evaluation: PACU Anesthesia Type: General Level of consciousness: awake and alert and patient cooperative Pain management: pain level controlled Vital Signs Assessment: post-procedure vital signs reviewed and stable Respiratory status: spontaneous breathing, nonlabored ventilation and respiratory function stable Cardiovascular status: blood pressure returned to baseline and stable Postop Assessment: no apparent nausea or vomiting and adequate PO intake Anesthetic complications: no    Last Vitals:  Vitals:   07/22/17 1445 07/22/17 1500  BP: (!) 162/90 (!) 169/100  Pulse: 94 98  Resp: 18 16  Temp: 36.9 C 37.1 C  SpO2: 93% 93%    Last Pain:  Vitals:   07/22/17 1109  TempSrc: Oral                 [place] Youngblood,E. Calvert Charland

## 2017-07-22 NOTE — Interval H&P Note (Signed)
History and Physical Interval Note:  She has been stented and treated for the infection and returns today for removal of the stone.   07/22/2017 11:38 AM  Alexandra Alexander  has presented today for surgery, with the diagnosis of LEFT DISTAL STONE  The various methods of treatment have been discussed with the patient and family. After consideration of risks, benefits and other options for treatment, the patient has consented to  Procedure(s): CYSTOSCOPY LEFT URETEROSCOPY  POSSIBLE /HOLMIUM LASER/STENT EXCHANGE (Left) as a surgical intervention .  The patient's history has been reviewed, patient examined, no change in status, stable for surgery.  I have reviewed the patient's chart and labs.  Questions were answered to the patient's satisfaction.     Irine Seal

## 2017-07-22 NOTE — Discharge Instructions (Signed)
Ureteral Stent Implantation, Care After Refer to this sheet in the next few weeks. These instructions provide you with information about caring for yourself after your procedure. Your health care provider may also give you more specific instructions. Your treatment has been planned according to current medical practices, but problems sometimes occur. Call your health care provider if you have any problems or questions after your procedure. What can I expect after the procedure? After the procedure, it is common to have:  Nausea.  Mild pain when you urinate. You may feel this pain in your lower back or lower abdomen. Pain should stop within a few minutes after you urinate. This may last for up to 1 week.  A small amount of blood in your urine for several days.  Follow these instructions at home:  Medicines  Take over-the-counter and prescription medicines only as told by your health care provider.  If you were prescribed an antibiotic medicine, take it as told by your health care provider. Do not stop taking the antibiotic even if you start to feel better.  Do not drive for 24 hours if you received a sedative.  Do not drive or operate heavy machinery while taking prescription pain medicines. Activity  Return to your normal activities as told by your health care provider. Ask your health care provider what activities are safe for you.  Do not lift anything that is heavier than 10 lb (4.5 kg). Follow this limit for 1 week after your procedure, or for as long as told by your health care provider. General instructions  Watch for any blood in your urine. Call your health care provider if the amount of blood in your urine increases.  If you have a catheter: ? Follow instructions from your health care provider about taking care of your catheter and collection bag. ? Do not take baths, swim, or use a hot tub until your health care provider approves.  Drink enough fluid to keep your urine  clear or pale yellow.  Keep all follow-up visits as told by your health care provider. This is important. Contact a health care provider if:  You have pain that gets worse or does not get better with medicine, especially pain when you urinate.  You have difficulty urinating.  You feel nauseous or you vomit repeatedly during a period of more than 2 days after the procedure. Get help right away if:  Your urine is dark red or has blood clots in it.  You are leaking urine (have incontinence).  The end of the stent comes out of your urethra.  You cannot urinate.  You have sudden, sharp, or severe pain in your abdomen or lower back.  You have a fever.  The stent can be pulled by the attached string on 2/15.  If there is no one willing to pull the stent, she may come to the office to have it done.   If the stent comes out prematurely, she may have some pain but that should not otherwise be a problem.    This information is not intended to replace advice given to you by your health care provider. Make sure you discuss any questions you have with your health care provider. Document Released: 01/27/2013 Document Revised: 11/02/2015 Document Reviewed: 12/09/2014 Elsevier Interactive Patient Education  [employer].

## 2017-07-22 NOTE — Anesthesia Preprocedure Evaluation (Signed)
Anesthesia Evaluation  Patient identified by MRN, date of birth, ID band Patient awake and Patient confused    Reviewed: Allergy & Precautions, NPO status , Patient's Chart, lab work & pertinent test results, reviewed documented beta blocker date and time   History of Anesthesia Complications Negative for: history of anesthetic complications  Airway Mallampati: I  TM Distance: >3 FB Neck ROM: Full    Dental  (+) Edentulous Upper, Edentulous Lower   Pulmonary neg pulmonary ROS,    breath sounds clear to auscultation       Cardiovascular hypertension, Pt. on medications  Rhythm:Regular Rate:Normal     Neuro/Psych Mild dementianegative neurological ROS     GI/Hepatic Neg liver ROS, GERD  Controlled,  Endo/Other  Hypothyroidism   Renal/GU Renal InsufficiencyRenal disease (creat 1.19)stones     Musculoskeletal   Abdominal   Peds  Hematology  (+) Blood dyscrasia (Hb 9.4), anemia ,   Anesthesia Other Findings H/o breast cancer  Reproductive/Obstetrics                             Anesthesia Physical Anesthesia Plan  ASA: III  Anesthesia Plan: General   Post-op Pain Management:    Induction: Intravenous  PONV Risk Score and Plan: 3 and Ondansetron, Dexamethasone and Treatment may vary due to age or medical condition  Airway Management Planned: LMA  Additional Equipment:   Intra-op Plan:   Post-operative Plan:   Informed Consent: I have reviewed the patients History and Physical, chart, labs and discussed the procedure including the risks, benefits and alternatives for the proposed anesthesia with the patient or authorized representative who has indicated his/her understanding and acceptance.   Consent reviewed with POA  Plan Discussed with: Surgeon and CRNA  Anesthesia Plan Comments: (Plan routine monitors, GA- LMA OK)        Anesthesia Quick Evaluation

## 2017-07-27 DIAGNOSIS — R110 Nausea: Secondary | ICD-10-CM | POA: Diagnosis not present

## 2017-07-27 DIAGNOSIS — K5900 Constipation, unspecified: Secondary | ICD-10-CM | POA: Diagnosis not present

## 2017-07-27 DIAGNOSIS — R410 Disorientation, unspecified: Secondary | ICD-10-CM | POA: Diagnosis not present

## 2017-08-04 DIAGNOSIS — R9431 Abnormal electrocardiogram [ECG] [EKG]: Secondary | ICD-10-CM | POA: Diagnosis not present

## 2017-08-04 DIAGNOSIS — N183 Chronic kidney disease, stage 3 (moderate): Secondary | ICD-10-CM | POA: Diagnosis not present

## 2017-08-04 DIAGNOSIS — R410 Disorientation, unspecified: Secondary | ICD-10-CM | POA: Diagnosis not present

## 2017-08-04 DIAGNOSIS — I454 Nonspecific intraventricular block: Secondary | ICD-10-CM | POA: Diagnosis not present

## 2017-08-04 DIAGNOSIS — G894 Chronic pain syndrome: Secondary | ICD-10-CM | POA: Diagnosis not present

## 2017-08-04 DIAGNOSIS — R269 Unspecified abnormalities of gait and mobility: Secondary | ICD-10-CM | POA: Diagnosis not present

## 2017-08-04 DIAGNOSIS — R4020 Unspecified coma: Secondary | ICD-10-CM | POA: Diagnosis not present

## 2017-08-04 DIAGNOSIS — R4182 Altered mental status, unspecified: Secondary | ICD-10-CM | POA: Diagnosis not present

## 2017-08-04 DIAGNOSIS — E785 Hyperlipidemia, unspecified: Secondary | ICD-10-CM | POA: Diagnosis not present

## 2017-08-04 DIAGNOSIS — R402441 Other coma, without documented Glasgow coma scale score, or with partial score reported, in the field [EMT or ambulance]: Secondary | ICD-10-CM | POA: Diagnosis not present

## 2017-08-04 DIAGNOSIS — I160 Hypertensive urgency: Secondary | ICD-10-CM | POA: Diagnosis not present

## 2017-08-04 DIAGNOSIS — I674 Hypertensive encephalopathy: Secondary | ICD-10-CM | POA: Diagnosis not present

## 2017-08-04 DIAGNOSIS — I129 Hypertensive chronic kidney disease with stage 1 through stage 4 chronic kidney disease, or unspecified chronic kidney disease: Secondary | ICD-10-CM | POA: Diagnosis not present

## 2017-08-04 DIAGNOSIS — R55 Syncope and collapse: Secondary | ICD-10-CM | POA: Diagnosis not present

## 2017-08-04 DIAGNOSIS — R5383 Other fatigue: Secondary | ICD-10-CM | POA: Diagnosis not present

## 2017-08-04 DIAGNOSIS — E039 Hypothyroidism, unspecified: Secondary | ICD-10-CM | POA: Diagnosis not present

## 2017-08-05 DIAGNOSIS — R5383 Other fatigue: Secondary | ICD-10-CM | POA: Diagnosis not present

## 2017-08-05 DIAGNOSIS — I129 Hypertensive chronic kidney disease with stage 1 through stage 4 chronic kidney disease, or unspecified chronic kidney disease: Secondary | ICD-10-CM | POA: Diagnosis present

## 2017-08-05 DIAGNOSIS — N183 Chronic kidney disease, stage 3 (moderate): Secondary | ICD-10-CM | POA: Diagnosis present

## 2017-08-05 DIAGNOSIS — E039 Hypothyroidism, unspecified: Secondary | ICD-10-CM | POA: Diagnosis present

## 2017-08-05 DIAGNOSIS — E785 Hyperlipidemia, unspecified: Secondary | ICD-10-CM | POA: Diagnosis present

## 2017-08-05 DIAGNOSIS — R0789 Other chest pain: Secondary | ICD-10-CM | POA: Diagnosis not present

## 2017-08-05 DIAGNOSIS — I160 Hypertensive urgency: Secondary | ICD-10-CM | POA: Diagnosis present

## 2017-08-05 DIAGNOSIS — R2681 Unsteadiness on feet: Secondary | ICD-10-CM | POA: Diagnosis not present

## 2017-08-05 DIAGNOSIS — R278 Other lack of coordination: Secondary | ICD-10-CM | POA: Diagnosis not present

## 2017-08-05 DIAGNOSIS — I674 Hypertensive encephalopathy: Secondary | ICD-10-CM | POA: Diagnosis present

## 2017-08-05 DIAGNOSIS — G9340 Encephalopathy, unspecified: Secondary | ICD-10-CM | POA: Diagnosis not present

## 2017-08-05 DIAGNOSIS — F0390 Unspecified dementia without behavioral disturbance: Secondary | ICD-10-CM | POA: Diagnosis present

## 2017-08-05 DIAGNOSIS — R4182 Altered mental status, unspecified: Secondary | ICD-10-CM | POA: Diagnosis not present

## 2017-08-05 DIAGNOSIS — R410 Disorientation, unspecified: Secondary | ICD-10-CM | POA: Diagnosis not present

## 2017-08-05 DIAGNOSIS — R41841 Cognitive communication deficit: Secondary | ICD-10-CM | POA: Diagnosis not present

## 2017-08-05 DIAGNOSIS — M6389 Disorders of muscle in diseases classified elsewhere, multiple sites: Secondary | ICD-10-CM | POA: Diagnosis not present

## 2017-08-05 DIAGNOSIS — M6281 Muscle weakness (generalized): Secondary | ICD-10-CM | POA: Diagnosis not present

## 2017-08-05 DIAGNOSIS — Z89512 Acquired absence of left leg below knee: Secondary | ICD-10-CM | POA: Diagnosis not present

## 2017-08-05 DIAGNOSIS — A4151 Sepsis due to Escherichia coli [E. coli]: Secondary | ICD-10-CM | POA: Diagnosis not present

## 2017-08-05 DIAGNOSIS — G894 Chronic pain syndrome: Secondary | ICD-10-CM | POA: Diagnosis present

## 2017-08-08 DIAGNOSIS — E039 Hypothyroidism, unspecified: Secondary | ICD-10-CM | POA: Diagnosis not present

## 2017-08-08 DIAGNOSIS — I160 Hypertensive urgency: Secondary | ICD-10-CM | POA: Diagnosis not present

## 2017-08-08 DIAGNOSIS — M6281 Muscle weakness (generalized): Secondary | ICD-10-CM | POA: Diagnosis not present

## 2017-08-08 DIAGNOSIS — G9340 Encephalopathy, unspecified: Secondary | ICD-10-CM | POA: Diagnosis not present

## 2017-08-08 DIAGNOSIS — R2681 Unsteadiness on feet: Secondary | ICD-10-CM | POA: Diagnosis not present

## 2017-08-08 DIAGNOSIS — R0789 Other chest pain: Secondary | ICD-10-CM | POA: Diagnosis not present

## 2017-08-08 DIAGNOSIS — R278 Other lack of coordination: Secondary | ICD-10-CM | POA: Diagnosis not present

## 2017-08-08 DIAGNOSIS — R41841 Cognitive communication deficit: Secondary | ICD-10-CM | POA: Diagnosis not present

## 2017-08-08 DIAGNOSIS — I129 Hypertensive chronic kidney disease with stage 1 through stage 4 chronic kidney disease, or unspecified chronic kidney disease: Secondary | ICD-10-CM | POA: Diagnosis not present

## 2017-08-08 DIAGNOSIS — G894 Chronic pain syndrome: Secondary | ICD-10-CM | POA: Diagnosis not present

## 2017-08-08 DIAGNOSIS — R4182 Altered mental status, unspecified: Secondary | ICD-10-CM | POA: Diagnosis not present

## 2017-08-08 DIAGNOSIS — A4151 Sepsis due to Escherichia coli [E. coli]: Secondary | ICD-10-CM | POA: Diagnosis not present

## 2017-08-08 DIAGNOSIS — M6389 Disorders of muscle in diseases classified elsewhere, multiple sites: Secondary | ICD-10-CM | POA: Diagnosis not present

## 2017-08-08 DIAGNOSIS — R410 Disorientation, unspecified: Secondary | ICD-10-CM | POA: Diagnosis not present

## 2017-08-08 DIAGNOSIS — R5383 Other fatigue: Secondary | ICD-10-CM | POA: Diagnosis not present

## 2017-08-08 DIAGNOSIS — N183 Chronic kidney disease, stage 3 (moderate): Secondary | ICD-10-CM | POA: Diagnosis not present

## 2017-08-22 DIAGNOSIS — R112 Nausea with vomiting, unspecified: Secondary | ICD-10-CM | POA: Diagnosis not present

## 2017-08-22 DIAGNOSIS — Z6822 Body mass index (BMI) 22.0-22.9, adult: Secondary | ICD-10-CM | POA: Diagnosis not present

## 2017-08-22 DIAGNOSIS — N200 Calculus of kidney: Secondary | ICD-10-CM | POA: Diagnosis not present

## 2017-08-22 DIAGNOSIS — I129 Hypertensive chronic kidney disease with stage 1 through stage 4 chronic kidney disease, or unspecified chronic kidney disease: Secondary | ICD-10-CM | POA: Diagnosis not present

## 2017-08-22 DIAGNOSIS — I10 Essential (primary) hypertension: Secondary | ICD-10-CM | POA: Diagnosis not present

## 2017-08-22 DIAGNOSIS — N390 Urinary tract infection, site not specified: Secondary | ICD-10-CM | POA: Diagnosis not present

## 2017-08-22 DIAGNOSIS — M5106 Intervertebral disc disorders with myelopathy, lumbar region: Secondary | ICD-10-CM | POA: Diagnosis not present

## 2017-08-22 DIAGNOSIS — N183 Chronic kidney disease, stage 3 (moderate): Secondary | ICD-10-CM | POA: Diagnosis not present

## 2017-08-24 DIAGNOSIS — G894 Chronic pain syndrome: Secondary | ICD-10-CM | POA: Diagnosis not present

## 2017-08-24 DIAGNOSIS — Z89512 Acquired absence of left leg below knee: Secondary | ICD-10-CM | POA: Diagnosis not present

## 2017-08-24 DIAGNOSIS — N183 Chronic kidney disease, stage 3 (moderate): Secondary | ICD-10-CM | POA: Diagnosis not present

## 2017-08-24 DIAGNOSIS — I129 Hypertensive chronic kidney disease with stage 1 through stage 4 chronic kidney disease, or unspecified chronic kidney disease: Secondary | ICD-10-CM | POA: Diagnosis not present

## 2017-08-24 DIAGNOSIS — R2689 Other abnormalities of gait and mobility: Secondary | ICD-10-CM | POA: Diagnosis not present

## 2017-08-24 DIAGNOSIS — R410 Disorientation, unspecified: Secondary | ICD-10-CM | POA: Diagnosis not present

## 2017-08-24 DIAGNOSIS — H3530 Unspecified macular degeneration: Secondary | ICD-10-CM | POA: Diagnosis not present

## 2017-08-24 DIAGNOSIS — R41842 Visuospatial deficit: Secondary | ICD-10-CM | POA: Diagnosis not present

## 2017-08-24 DIAGNOSIS — R41840 Attention and concentration deficit: Secondary | ICD-10-CM | POA: Diagnosis not present

## 2017-08-24 DIAGNOSIS — Z87440 Personal history of urinary (tract) infections: Secondary | ICD-10-CM | POA: Diagnosis not present

## 2017-08-26 DIAGNOSIS — G894 Chronic pain syndrome: Secondary | ICD-10-CM | POA: Diagnosis not present

## 2017-08-26 DIAGNOSIS — R41840 Attention and concentration deficit: Secondary | ICD-10-CM | POA: Diagnosis not present

## 2017-08-26 DIAGNOSIS — R410 Disorientation, unspecified: Secondary | ICD-10-CM | POA: Diagnosis not present

## 2017-08-26 DIAGNOSIS — N183 Chronic kidney disease, stage 3 (moderate): Secondary | ICD-10-CM | POA: Diagnosis not present

## 2017-08-26 DIAGNOSIS — R2689 Other abnormalities of gait and mobility: Secondary | ICD-10-CM | POA: Diagnosis not present

## 2017-08-26 DIAGNOSIS — I129 Hypertensive chronic kidney disease with stage 1 through stage 4 chronic kidney disease, or unspecified chronic kidney disease: Secondary | ICD-10-CM | POA: Diagnosis not present

## 2017-08-29 DIAGNOSIS — G894 Chronic pain syndrome: Secondary | ICD-10-CM | POA: Diagnosis not present

## 2017-08-29 DIAGNOSIS — I129 Hypertensive chronic kidney disease with stage 1 through stage 4 chronic kidney disease, or unspecified chronic kidney disease: Secondary | ICD-10-CM | POA: Diagnosis not present

## 2017-08-29 DIAGNOSIS — N183 Chronic kidney disease, stage 3 (moderate): Secondary | ICD-10-CM | POA: Diagnosis not present

## 2017-08-29 DIAGNOSIS — R2689 Other abnormalities of gait and mobility: Secondary | ICD-10-CM | POA: Diagnosis not present

## 2017-08-29 DIAGNOSIS — R410 Disorientation, unspecified: Secondary | ICD-10-CM | POA: Diagnosis not present

## 2017-08-29 DIAGNOSIS — R41840 Attention and concentration deficit: Secondary | ICD-10-CM | POA: Diagnosis not present

## 2017-09-01 DIAGNOSIS — I129 Hypertensive chronic kidney disease with stage 1 through stage 4 chronic kidney disease, or unspecified chronic kidney disease: Secondary | ICD-10-CM | POA: Diagnosis not present

## 2017-09-01 DIAGNOSIS — R41840 Attention and concentration deficit: Secondary | ICD-10-CM | POA: Diagnosis not present

## 2017-09-01 DIAGNOSIS — R2689 Other abnormalities of gait and mobility: Secondary | ICD-10-CM | POA: Diagnosis not present

## 2017-09-01 DIAGNOSIS — R410 Disorientation, unspecified: Secondary | ICD-10-CM | POA: Diagnosis not present

## 2017-09-01 DIAGNOSIS — N183 Chronic kidney disease, stage 3 (moderate): Secondary | ICD-10-CM | POA: Diagnosis not present

## 2017-09-01 DIAGNOSIS — G894 Chronic pain syndrome: Secondary | ICD-10-CM | POA: Diagnosis not present

## 2017-09-02 DIAGNOSIS — R410 Disorientation, unspecified: Secondary | ICD-10-CM | POA: Diagnosis not present

## 2017-09-02 DIAGNOSIS — N183 Chronic kidney disease, stage 3 (moderate): Secondary | ICD-10-CM | POA: Diagnosis not present

## 2017-09-02 DIAGNOSIS — R41840 Attention and concentration deficit: Secondary | ICD-10-CM | POA: Diagnosis not present

## 2017-09-02 DIAGNOSIS — G894 Chronic pain syndrome: Secondary | ICD-10-CM | POA: Diagnosis not present

## 2017-09-02 DIAGNOSIS — I129 Hypertensive chronic kidney disease with stage 1 through stage 4 chronic kidney disease, or unspecified chronic kidney disease: Secondary | ICD-10-CM | POA: Diagnosis not present

## 2017-09-02 DIAGNOSIS — R2689 Other abnormalities of gait and mobility: Secondary | ICD-10-CM | POA: Diagnosis not present

## 2017-09-03 DIAGNOSIS — R2689 Other abnormalities of gait and mobility: Secondary | ICD-10-CM | POA: Diagnosis not present

## 2017-09-03 DIAGNOSIS — I129 Hypertensive chronic kidney disease with stage 1 through stage 4 chronic kidney disease, or unspecified chronic kidney disease: Secondary | ICD-10-CM | POA: Diagnosis not present

## 2017-09-03 DIAGNOSIS — G894 Chronic pain syndrome: Secondary | ICD-10-CM | POA: Diagnosis not present

## 2017-09-03 DIAGNOSIS — R410 Disorientation, unspecified: Secondary | ICD-10-CM | POA: Diagnosis not present

## 2017-09-03 DIAGNOSIS — N183 Chronic kidney disease, stage 3 (moderate): Secondary | ICD-10-CM | POA: Diagnosis not present

## 2017-09-03 DIAGNOSIS — R41840 Attention and concentration deficit: Secondary | ICD-10-CM | POA: Diagnosis not present

## 2017-09-04 DIAGNOSIS — R2689 Other abnormalities of gait and mobility: Secondary | ICD-10-CM | POA: Diagnosis not present

## 2017-09-04 DIAGNOSIS — N183 Chronic kidney disease, stage 3 (moderate): Secondary | ICD-10-CM | POA: Diagnosis not present

## 2017-09-04 DIAGNOSIS — R41840 Attention and concentration deficit: Secondary | ICD-10-CM | POA: Diagnosis not present

## 2017-09-04 DIAGNOSIS — I129 Hypertensive chronic kidney disease with stage 1 through stage 4 chronic kidney disease, or unspecified chronic kidney disease: Secondary | ICD-10-CM | POA: Diagnosis not present

## 2017-09-04 DIAGNOSIS — R410 Disorientation, unspecified: Secondary | ICD-10-CM | POA: Diagnosis not present

## 2017-09-04 DIAGNOSIS — G894 Chronic pain syndrome: Secondary | ICD-10-CM | POA: Diagnosis not present

## 2017-09-05 DIAGNOSIS — R2689 Other abnormalities of gait and mobility: Secondary | ICD-10-CM | POA: Diagnosis not present

## 2017-09-05 DIAGNOSIS — N183 Chronic kidney disease, stage 3 (moderate): Secondary | ICD-10-CM | POA: Diagnosis not present

## 2017-09-05 DIAGNOSIS — I129 Hypertensive chronic kidney disease with stage 1 through stage 4 chronic kidney disease, or unspecified chronic kidney disease: Secondary | ICD-10-CM | POA: Diagnosis not present

## 2017-09-05 DIAGNOSIS — R410 Disorientation, unspecified: Secondary | ICD-10-CM | POA: Diagnosis not present

## 2017-09-05 DIAGNOSIS — G894 Chronic pain syndrome: Secondary | ICD-10-CM | POA: Diagnosis not present

## 2017-09-05 DIAGNOSIS — R41840 Attention and concentration deficit: Secondary | ICD-10-CM | POA: Diagnosis not present

## 2017-09-09 DIAGNOSIS — R41840 Attention and concentration deficit: Secondary | ICD-10-CM | POA: Diagnosis not present

## 2017-09-09 DIAGNOSIS — R2689 Other abnormalities of gait and mobility: Secondary | ICD-10-CM | POA: Diagnosis not present

## 2017-09-09 DIAGNOSIS — R410 Disorientation, unspecified: Secondary | ICD-10-CM | POA: Diagnosis not present

## 2017-09-09 DIAGNOSIS — G894 Chronic pain syndrome: Secondary | ICD-10-CM | POA: Diagnosis not present

## 2017-09-09 DIAGNOSIS — I129 Hypertensive chronic kidney disease with stage 1 through stage 4 chronic kidney disease, or unspecified chronic kidney disease: Secondary | ICD-10-CM | POA: Diagnosis not present

## 2017-09-09 DIAGNOSIS — N183 Chronic kidney disease, stage 3 (moderate): Secondary | ICD-10-CM | POA: Diagnosis not present

## 2017-09-11 DIAGNOSIS — I129 Hypertensive chronic kidney disease with stage 1 through stage 4 chronic kidney disease, or unspecified chronic kidney disease: Secondary | ICD-10-CM | POA: Diagnosis not present

## 2017-09-11 DIAGNOSIS — G894 Chronic pain syndrome: Secondary | ICD-10-CM | POA: Diagnosis not present

## 2017-09-11 DIAGNOSIS — R2689 Other abnormalities of gait and mobility: Secondary | ICD-10-CM | POA: Diagnosis not present

## 2017-09-11 DIAGNOSIS — R41840 Attention and concentration deficit: Secondary | ICD-10-CM | POA: Diagnosis not present

## 2017-09-11 DIAGNOSIS — R410 Disorientation, unspecified: Secondary | ICD-10-CM | POA: Diagnosis not present

## 2017-09-11 DIAGNOSIS — N183 Chronic kidney disease, stage 3 (moderate): Secondary | ICD-10-CM | POA: Diagnosis not present

## 2017-09-15 ENCOUNTER — Other Ambulatory Visit: Payer: Self-pay

## 2017-09-15 ENCOUNTER — Encounter (HOSPITAL_BASED_OUTPATIENT_CLINIC_OR_DEPARTMENT_OTHER): Payer: Self-pay | Admitting: *Deleted

## 2017-09-15 ENCOUNTER — Emergency Department (HOSPITAL_BASED_OUTPATIENT_CLINIC_OR_DEPARTMENT_OTHER)
Admission: EM | Admit: 2017-09-15 | Discharge: 2017-09-15 | Disposition: A | Discharge status: Home / Self Care | Payer: Medicare Other | Attending: Emergency Medicine | Admitting: Emergency Medicine

## 2017-09-15 DIAGNOSIS — I129 Hypertensive chronic kidney disease with stage 1 through stage 4 chronic kidney disease, or unspecified chronic kidney disease: Secondary | ICD-10-CM | POA: Insufficient documentation

## 2017-09-15 DIAGNOSIS — Y92129 Unspecified place in nursing home as the place of occurrence of the external cause: Secondary | ICD-10-CM | POA: Diagnosis not present

## 2017-09-15 DIAGNOSIS — W01190A Fall on same level from slipping, tripping and stumbling with subsequent striking against furniture, initial encounter: Secondary | ICD-10-CM | POA: Insufficient documentation

## 2017-09-15 DIAGNOSIS — Y9389 Activity, other specified: Secondary | ICD-10-CM | POA: Insufficient documentation

## 2017-09-15 DIAGNOSIS — S60221A Contusion of right hand, initial encounter: Secondary | ICD-10-CM | POA: Insufficient documentation

## 2017-09-15 DIAGNOSIS — Y998 Other external cause status: Secondary | ICD-10-CM | POA: Insufficient documentation

## 2017-09-15 DIAGNOSIS — Z853 Personal history of malignant neoplasm of breast: Secondary | ICD-10-CM | POA: Insufficient documentation

## 2017-09-15 DIAGNOSIS — R410 Disorientation, unspecified: Secondary | ICD-10-CM | POA: Diagnosis not present

## 2017-09-15 DIAGNOSIS — E039 Hypothyroidism, unspecified: Secondary | ICD-10-CM | POA: Diagnosis not present

## 2017-09-15 DIAGNOSIS — W19XXXA Unspecified fall, initial encounter: Secondary | ICD-10-CM

## 2017-09-15 DIAGNOSIS — Z79899 Other long term (current) drug therapy: Secondary | ICD-10-CM | POA: Insufficient documentation

## 2017-09-15 DIAGNOSIS — R41840 Attention and concentration deficit: Secondary | ICD-10-CM | POA: Diagnosis not present

## 2017-09-15 DIAGNOSIS — T148XXA Other injury of unspecified body region, initial encounter: Secondary | ICD-10-CM | POA: Diagnosis not present

## 2017-09-15 DIAGNOSIS — I10 Essential (primary) hypertension: Secondary | ICD-10-CM | POA: Diagnosis not present

## 2017-09-15 DIAGNOSIS — R2689 Other abnormalities of gait and mobility: Secondary | ICD-10-CM | POA: Diagnosis not present

## 2017-09-15 DIAGNOSIS — S50812A Abrasion of left forearm, initial encounter: Secondary | ICD-10-CM | POA: Insufficient documentation

## 2017-09-15 DIAGNOSIS — G894 Chronic pain syndrome: Secondary | ICD-10-CM | POA: Diagnosis not present

## 2017-09-15 DIAGNOSIS — S0990XA Unspecified injury of head, initial encounter: Secondary | ICD-10-CM | POA: Diagnosis present

## 2017-09-15 DIAGNOSIS — S59909A Unspecified injury of unspecified elbow, initial encounter: Secondary | ICD-10-CM | POA: Diagnosis not present

## 2017-09-15 DIAGNOSIS — N183 Chronic kidney disease, stage 3 (moderate): Secondary | ICD-10-CM | POA: Insufficient documentation

## 2017-09-15 DIAGNOSIS — F0390 Unspecified dementia without behavioral disturbance: Secondary | ICD-10-CM | POA: Insufficient documentation

## 2017-09-15 LAB — COMPREHENSIVE METABOLIC PANEL
ALBUMIN: 4 g/dL (ref 3.5–5.0)
ALT: 15 U/L (ref 14–54)
ANION GAP: 9 (ref 5–15)
AST: 27 U/L (ref 15–41)
Alkaline Phosphatase: 53 U/L (ref 38–126)
BUN: 34 mg/dL — ABNORMAL HIGH (ref 6–20)
CO2: 23 mmol/L (ref 22–32)
Calcium: 9 mg/dL (ref 8.9–10.3)
Chloride: 104 mmol/L (ref 101–111)
Creatinine, Ser: 1.18 mg/dL — ABNORMAL HIGH (ref 0.44–1.00)
GFR calc non Af Amer: 39 mL/min — ABNORMAL LOW (ref >60)
GFR, EST AFRICAN AMERICAN: 45 mL/min — AB (ref >60)
Glucose, Bld: 100 mg/dL — ABNORMAL HIGH (ref 65–99)
Potassium: 4.5 mmol/L (ref 3.5–5.1)
Sodium: 136 mmol/L (ref 135–145)
Total Bilirubin: 0.6 mg/dL (ref 0.3–1.2)
Total Protein: 6.5 g/dL (ref 6.5–8.1)

## 2017-09-15 NOTE — Discharge Instructions (Addendum)
Wash the abrasion on Alexandra Alexander's left forearm daily with soap and water.  Watch for signs of infection and to include redness around the wound, swelling, more pain or drainage from the wound or fever.  If you think she might be developing an infection, have her return to the emergency department or see her doctor

## 2017-09-15 NOTE — ED Provider Notes (Signed)
[facility] EMERGENCY DEPARTMENT Provider Note   CSN: [id number] Arrival date & time: 09/15/17  1753     History   Chief Complaint Chief Complaint  Patient presents with  . Fall    Alexandra Alexander is a 84 y.o. female.  Alexandra level 5 caveat dementia history is obtained from patient's adult children who accompany her and from [place],, medical technician at assisted living facility where patient resides.  Patient had unwitnessed fall 4:45 PM today striking her head against a door.  She is been acting as her normal self since the event.  She sustained an abrasion to her left forearm and a bruise to her right hand as result of the fall.  No treatment prior to coming here.  Past Medical History:  Diagnosis Date  . Cancer [facility]) breast  . H/O mastectomy right  . Hypertension   . Hypothyroidism     Patient Active Problem List   Diagnosis Date Noted  . AKI (acute kidney injury) ([place]) 07/14/2017  . Stage 3 chronic kidney disease ([place]) 07/14/2017  . Acute pyelonephritis 07/14/2017  . Left ureteral stone 07/14/2017  . Elevated troponin 07/14/2017  . Hypothyroidism 07/14/2017  . Sepsis due to Escherichia coli (E. coli) ([place]) 07/14/2017  . E coli bacteremia 07/14/2017  . Sepsis ([place]) 07/13/2017  . Hypertension 05/03/2017  . Mild renal insufficiency 05/03/2017    Past Surgical History:  Procedure Laterality Date  . ABDOMINAL HYSTERECTOMY    . BREAST SURGERY    . CYSTOSCOPY WITH STENT PLACEMENT Left 07/13/2017   Procedure: CYSTOSCOPY, LEFT RETROGRADE, LEFT URETEROSCOPY WITH LEFT URETERAL STENT PLACEMENT;  Surgeon: Irine Seal, MD;  Location: [facility] ORS;  Service: Urology;  Laterality: Left;  . CYSTOSCOPY/URETEROSCOPY/HOLMIUM LASER/STENT PLACEMENT Left 07/22/2017   Procedure: CYSTOSCOPY LEFT URETEROSCOPY  POSSIBLE /HOLMIUM LASER/STENT EXCHANGE;  Surgeon: Irine Seal, MD;  Location: [facility] ORS;  Service: Urology;  Laterality: Left;  . LEG AMPUTATION       OB History     None      Home Medications    Prior to Admission medications   Medication Sig Start Date End Date Taking? Authorizing Provider  acetaminophen (TYLENOL) 650 MG CR tablet Take 650 mg by mouth 2 (two) times daily.    [provider]  escitalopram (LEXAPRO) 10 MG tablet Take 10 mg by mouth daily.     [provider]  fentaNYL (DURAGESIC - DOSED MCG/HR) 50 MCG/HR Place 1 patch (50 mcg total) onto the skin every 3 (three) days. REMOVE OLD ONE PRIOR TO APPLYING NEW ONE 07/19/17   Janece Canterbury, MD  hydrALAZINE (APRESOLINE) 100 MG tablet Take 1 tablet (100 mg total) by mouth 3 (three) times daily. 07/19/17   Janece Canterbury, MD  levothyroxine (SYNTHROID, LEVOTHROID) 75 MCG tablet Take 75 mcg by mouth daily before breakfast.    [provider]  losartan (COZAAR) 100 MG tablet Take 100 mg by mouth daily.    [provider]  lubiprostone (AMITIZA) 8 MCG capsule Take 8 mcg by mouth daily.    [provider]  Lutein-Zeaxanthin 20-1 MG CAPS Take 1 capsule by mouth daily.    [provider]  Multiple Vitamins-Minerals (CENTRUM SILVER 50+WOMEN) TABS Take 1 tablet by mouth daily.    [provider]  Omega-3 Fatty Acids (FISH OIL) 1000 MG CAPS Take 1,000 mg by mouth every evening.     [provider]  polyethylene glycol (MIRALAX / GLYCOLAX) packet Take 17 g by mouth daily as  needed for mild constipation.    [provider]    Family History No family history on file.  Social History Social History   Tobacco Use  . Smoking status: Never Smoker  . Smokeless tobacco: Never Used  Substance Use Topics  . Alcohol use: No  . Drug use: No     Allergies   Codeine and Vancomycin   Review of Systems Review of Systems  Unable to perform ROS: Dementia  Eyes: Positive for visual disturbance.       Blind in left eye  Musculoskeletal: Positive for gait problem.       Walks with a walker  Skin: Positive for wound.      Physical Exam Updated Vital Signs BP (!) 144/79 (BP Location: Left Arm)   Pulse (!) 51   Temp 98.1 F (36.7 C) (Oral)   Resp 18   SpO2 96%   Physical Exam  Constitutional: No distress.  HENT:  Head: Normocephalic and atraumatic.  Eyes: EOM are normal.  Neck: Neck supple.  No tenderness  Cardiovascular:  Bradycardic heart rate counted 58 bpm by me  Pulmonary/Chest: Breath sounds normal. She exhibits no tenderness.  Abdominal: She exhibits no distension. There is no tenderness.  Contusion abrasion or tenderness  Musculoskeletal:  Tired spine nontender.  Pelvis stable nontender.  Left lower extremity with AKA with prosthesis attached.  Left upper extremity with 10 cm abrasion along the ulnar forearm no active bleeding no deformity no swelling.  Right upper extremity with dime-sized ecchymosis at webspace between thumb and forefinger.  No soft tissue swelling no tenderness.  Neurological: She is alert. No cranial nerve deficit.  5/5 overall walks with walker without difficulty.  Not lightheaded on standing  Skin: Capillary refill takes less than 2 seconds.  Nursing note and vitals reviewed.    ED Treatments / Results  Labs (all labs ordered are listed, but only abnormal results are displayed) Labs Reviewed  BASIC METABOLIC PANEL  CBC WITH DIFFERENTIAL/PLATELET    EKG None  EKG Interpretation  Date/Time:  Monday September 15 2017 19:04:55 EDT Ventricular Rate:  49 PR Interval:    QRS Duration: 153 QT Interval:  493 QTC Calculation: 446 R Axis:   41 Text Interpretation:  Sinus bradycardia Right bundle branch block Since last tracing rate slower Confirmed by Orlie Dakin [phone]) on 09/15/2017 7:16:14 PM      Results for orders placed or performed during the hospital encounter of 09/15/17  Comprehensive metabolic panel  Result Value Ref Range   Sodium 136 135 - 145 mmol/L   Potassium 4.5 3.5 - 5.1 mmol/L   Chloride 104 101 - 111 mmol/L   CO2 23 22 - 32 mmol/L    Glucose, Bld 100 (H) 65 - 99 mg/dL   BUN 34 (H) 6 - 20 mg/dL   Creatinine, Ser 1.18 (H) 0.44 - 1.00 mg/dL   Calcium 9.0 8.9 - 10.3 mg/dL   Total Protein 6.5 6.5 - 8.1 g/dL   Albumin 4.0 3.5 - 5.0 g/dL   AST 27 15 - 41 U/L   ALT 15 14 - 54 U/L   Alkaline Phosphatase 53 38 - 126 U/L   Total Bilirubin 0.6 0.3 - 1.2 mg/dL   GFR calc non Af Amer 39 (L) >60 mL/min   GFR calc Af Amer 45 (L) >60 mL/min   Anion gap 9 5 - 15   No results found.  Radiology No results found.  Procedures Procedures (including critical care time)  Medications  Ordered in ED Medications - No data to display   Initial Impression / Assessment and Plan / ED Course  I have reviewed the triage vital signs and the nursing notes.  Pertinent labs & imaging results that were available during my care of the patient were reviewed by me and considered in my medical decision making (see chart for details).     9 PM patient resting comfortably.  I do not feel that patient needs imaging of head or of cervical spine.  She has no pain no signs of trauma.  Acting his normal self per family members.  Unable to obtain CBCas  multiple specimens were lysed.  I do not feel that patient requires CBC she is not lightheaded.  Conjunctiva are pink.  She is able to walk without difficulty and without dyspnea or weakness She does have mild bradycardia however normal blood pressure, bradycardia does not require treatment Final Clinical Impressions(s) / ED Diagnoses  Diagnoses #1 fall #2 abrasion to left forearm #3 contusion to right hand Final diagnoses:  None    ED Discharge Orders    None       Orlie Dakin, MD 09/15/17 2110

## 2017-09-15 NOTE — ED Triage Notes (Signed)
Unwitnessed fall at nursing home. She told staff he had a headache. She told EMS she does not have a headache. Abrasion to her left elbow, wrist and thumb. Bruising to the palm of her right hand. Alert but confused per normal for this pt.

## 2017-09-15 NOTE — ED Notes (Signed)
Blood sample for CBC has clotted x 2 MD made aware and will advise on redraw.

## 2017-09-15 NOTE — ED Notes (Signed)
ED Provider at bedside. 

## 2017-09-17 DIAGNOSIS — R2689 Other abnormalities of gait and mobility: Secondary | ICD-10-CM | POA: Diagnosis not present

## 2017-09-17 DIAGNOSIS — R410 Disorientation, unspecified: Secondary | ICD-10-CM | POA: Diagnosis not present

## 2017-09-17 DIAGNOSIS — G894 Chronic pain syndrome: Secondary | ICD-10-CM | POA: Diagnosis not present

## 2017-09-17 DIAGNOSIS — I129 Hypertensive chronic kidney disease with stage 1 through stage 4 chronic kidney disease, or unspecified chronic kidney disease: Secondary | ICD-10-CM | POA: Diagnosis not present

## 2017-09-17 DIAGNOSIS — N183 Chronic kidney disease, stage 3 (moderate): Secondary | ICD-10-CM | POA: Diagnosis not present

## 2017-09-17 DIAGNOSIS — R41840 Attention and concentration deficit: Secondary | ICD-10-CM | POA: Diagnosis not present

## 2017-09-18 DIAGNOSIS — R41840 Attention and concentration deficit: Secondary | ICD-10-CM | POA: Diagnosis not present

## 2017-09-18 DIAGNOSIS — R2689 Other abnormalities of gait and mobility: Secondary | ICD-10-CM | POA: Diagnosis not present

## 2017-09-18 DIAGNOSIS — I129 Hypertensive chronic kidney disease with stage 1 through stage 4 chronic kidney disease, or unspecified chronic kidney disease: Secondary | ICD-10-CM | POA: Diagnosis not present

## 2017-09-18 DIAGNOSIS — G894 Chronic pain syndrome: Secondary | ICD-10-CM | POA: Diagnosis not present

## 2017-09-18 DIAGNOSIS — N183 Chronic kidney disease, stage 3 (moderate): Secondary | ICD-10-CM | POA: Diagnosis not present

## 2017-09-18 DIAGNOSIS — R410 Disorientation, unspecified: Secondary | ICD-10-CM | POA: Diagnosis not present

## 2017-10-30 DIAGNOSIS — N390 Urinary tract infection, site not specified: Secondary | ICD-10-CM | POA: Diagnosis not present

## 2017-12-08 DIAGNOSIS — R82998 Other abnormal findings in urine: Secondary | ICD-10-CM | POA: Diagnosis not present

## 2017-12-08 DIAGNOSIS — R269 Unspecified abnormalities of gait and mobility: Secondary | ICD-10-CM | POA: Diagnosis not present

## 2017-12-08 DIAGNOSIS — M5106 Intervertebral disc disorders with myelopathy, lumbar region: Secondary | ICD-10-CM | POA: Diagnosis not present

## 2017-12-08 DIAGNOSIS — C50919 Malignant neoplasm of unspecified site of unspecified female breast: Secondary | ICD-10-CM | POA: Diagnosis not present

## 2017-12-08 DIAGNOSIS — G547 Phantom limb syndrome without pain: Secondary | ICD-10-CM | POA: Diagnosis not present

## 2017-12-08 DIAGNOSIS — N183 Chronic kidney disease, stage 3 (moderate): Secondary | ICD-10-CM | POA: Diagnosis not present

## 2017-12-08 DIAGNOSIS — Z79899 Other long term (current) drug therapy: Secondary | ICD-10-CM | POA: Diagnosis not present

## 2017-12-08 DIAGNOSIS — D638 Anemia in other chronic diseases classified elsewhere: Secondary | ICD-10-CM | POA: Diagnosis not present

## 2017-12-08 DIAGNOSIS — I10 Essential (primary) hypertension: Secondary | ICD-10-CM | POA: Diagnosis not present

## 2017-12-08 DIAGNOSIS — Z89512 Acquired absence of left leg below knee: Secondary | ICD-10-CM | POA: Diagnosis not present

## 2017-12-08 DIAGNOSIS — E038 Other specified hypothyroidism: Secondary | ICD-10-CM | POA: Diagnosis not present

## 2017-12-08 DIAGNOSIS — I129 Hypertensive chronic kidney disease with stage 1 through stage 4 chronic kidney disease, or unspecified chronic kidney disease: Secondary | ICD-10-CM | POA: Diagnosis not present

## 2017-12-08 DIAGNOSIS — Z0000 Encounter for general adult medical examination without abnormal findings: Secondary | ICD-10-CM | POA: Diagnosis not present

## 2018-01-29 ENCOUNTER — Telehealth: Payer: Self-pay | Admitting: *Deleted

## 2018-01-29 NOTE — Telephone Encounter (Signed)
A sender error has taken place: physician orders sent in error, not a patient of our office; forwarded back to [facility] with PCP information on this patient. Will call and make them aware as well at [facility] 08/22

## 2018-02-03 ENCOUNTER — Emergency Department (HOSPITAL_BASED_OUTPATIENT_CLINIC_OR_DEPARTMENT_OTHER)
Admission: EM | Admit: 2018-02-03 | Discharge: 2018-02-04 | Disposition: A | Discharge status: Home / Self Care | Payer: Medicare Other | Attending: Emergency Medicine | Admitting: Emergency Medicine

## 2018-02-03 ENCOUNTER — Encounter (HOSPITAL_BASED_OUTPATIENT_CLINIC_OR_DEPARTMENT_OTHER): Payer: Self-pay

## 2018-02-03 ENCOUNTER — Other Ambulatory Visit: Payer: Self-pay

## 2018-02-03 ENCOUNTER — Emergency Department (HOSPITAL_BASED_OUTPATIENT_CLINIC_OR_DEPARTMENT_OTHER): Payer: Medicare Other

## 2018-02-03 DIAGNOSIS — W01198A Fall on same level from slipping, tripping and stumbling with subsequent striking against other object, initial encounter: Secondary | ICD-10-CM | POA: Diagnosis not present

## 2018-02-03 DIAGNOSIS — Y9389 Activity, other specified: Secondary | ICD-10-CM | POA: Diagnosis not present

## 2018-02-03 DIAGNOSIS — Z853 Personal history of malignant neoplasm of breast: Secondary | ICD-10-CM | POA: Insufficient documentation

## 2018-02-03 DIAGNOSIS — Y92129 Unspecified place in nursing home as the place of occurrence of the external cause: Secondary | ICD-10-CM | POA: Diagnosis not present

## 2018-02-03 DIAGNOSIS — Y998 Other external cause status: Secondary | ICD-10-CM | POA: Insufficient documentation

## 2018-02-03 DIAGNOSIS — S12001A Unspecified nondisplaced fracture of first cervical vertebra, initial encounter for closed fracture: Secondary | ICD-10-CM | POA: Diagnosis not present

## 2018-02-03 DIAGNOSIS — I129 Hypertensive chronic kidney disease with stage 1 through stage 4 chronic kidney disease, or unspecified chronic kidney disease: Secondary | ICD-10-CM | POA: Diagnosis not present

## 2018-02-03 DIAGNOSIS — Z79899 Other long term (current) drug therapy: Secondary | ICD-10-CM | POA: Diagnosis not present

## 2018-02-03 DIAGNOSIS — S0003XA Contusion of scalp, initial encounter: Secondary | ICD-10-CM | POA: Diagnosis not present

## 2018-02-03 DIAGNOSIS — S199XXA Unspecified injury of neck, initial encounter: Secondary | ICD-10-CM | POA: Diagnosis not present

## 2018-02-03 DIAGNOSIS — S0990XA Unspecified injury of head, initial encounter: Secondary | ICD-10-CM | POA: Diagnosis not present

## 2018-02-03 DIAGNOSIS — N183 Chronic kidney disease, stage 3 (moderate): Secondary | ICD-10-CM | POA: Diagnosis not present

## 2018-02-03 DIAGNOSIS — E039 Hypothyroidism, unspecified: Secondary | ICD-10-CM | POA: Insufficient documentation

## 2018-02-03 DIAGNOSIS — W19XXXA Unspecified fall, initial encounter: Secondary | ICD-10-CM

## 2018-02-03 DIAGNOSIS — I10 Essential (primary) hypertension: Secondary | ICD-10-CM | POA: Diagnosis not present

## 2018-02-03 NOTE — ED Triage Notes (Signed)
Pt from [facility]. Pt fell backwards striking back of head on nightstand. (+) hematoma. No LOC, no blood thinners

## 2018-02-04 DIAGNOSIS — S199XXA Unspecified injury of neck, initial encounter: Secondary | ICD-10-CM | POA: Diagnosis not present

## 2018-02-04 DIAGNOSIS — S0990XA Unspecified injury of head, initial encounter: Secondary | ICD-10-CM | POA: Diagnosis not present

## 2018-02-04 NOTE — Discharge Instructions (Addendum)
You were seen today after a fall.  You have a bump on her head.  Apply ice for any pain or discomfort.  Your CT scan showed a age-indeterminate C1 fracture.  This may or may not be related to your fall today.  Per neurosurgery, you may wear a soft neck brace for comfort.  Otherwise no specific follow-up needed.

## 2018-02-04 NOTE — ED Notes (Signed)
Report given to Lisa, RN.

## 2018-02-04 NOTE — ED Provider Notes (Signed)
[facility] EMERGENCY DEPARTMENT Provider Note   CSN: [id number] Arrival date & time: 02/03/18  2337     History   Chief Complaint Chief Complaint  Patient presents with  . Fall    HPI Alexandra Alexander is a 84 y.o. female.  HPI  This is a 84 year old female who presents following a fall.  Patient lives at [facility].  She reports that she lost her balance and fell backwards hitting her head.  She did not lose consciousness.  She remembers the fall.  Denies syncope, chest pain, shortness of breath.  She is not on any blood thinners.  Denies neck pain, hip pain.  She has been ambulatory.  At baseline she has a left lower extremity prosthetic.  Denies any recent illnesses or fevers.  Noted to have a hematoma to the posterior scalp per EMS.  Vital signs stable in route.  Past Medical History:  Diagnosis Date  . Cancer [facility]) breast  . H/O mastectomy right  . Hypertension   . Hypothyroidism     Patient Active Problem List   Diagnosis Date Noted  . AKI (acute kidney injury) ([place]) 07/14/2017  . Stage 3 chronic kidney disease ([place]) 07/14/2017  . Acute pyelonephritis 07/14/2017  . Left ureteral stone 07/14/2017  . Elevated troponin 07/14/2017  . Hypothyroidism 07/14/2017  . Sepsis due to Escherichia coli (E. coli) ([place]) 07/14/2017  . E coli bacteremia 07/14/2017  . Sepsis ([place]) 07/13/2017  . Hypertension 05/03/2017  . Mild renal insufficiency 05/03/2017    Past Surgical History:  Procedure Laterality Date  . ABDOMINAL HYSTERECTOMY    . BREAST SURGERY    . CYSTOSCOPY WITH STENT PLACEMENT Left 07/13/2017   Procedure: CYSTOSCOPY, LEFT RETROGRADE, LEFT URETEROSCOPY WITH LEFT URETERAL STENT PLACEMENT;  Surgeon: Irine Seal, MD;  Location: [facility];  Service: Urology;  Laterality: Left;  . CYSTOSCOPY/URETEROSCOPY/HOLMIUM LASER/STENT PLACEMENT Left 07/22/2017   Procedure: CYSTOSCOPY LEFT URETEROSCOPY  POSSIBLE /HOLMIUM LASER/STENT EXCHANGE;  Surgeon: Irine Seal, MD;   Location: [facility];  Service: Urology;  Laterality: Left;  . LEG AMPUTATION       OB History   None      Home Medications    Prior to Admission medications   Medication Sig Start Date End Date Taking? Authorizing Provider  acetaminophen (TYLENOL) 650 MG CR tablet Take 650 mg by mouth 2 (two) times daily.    [provider]  escitalopram (LEXAPRO) 10 MG tablet Take 10 mg by mouth daily.     [provider]  fentaNYL (DURAGESIC - DOSED MCG/HR) 50 MCG/HR Place 1 patch (50 mcg total) onto the skin every 3 (three) days. REMOVE OLD ONE PRIOR TO APPLYING NEW ONE 07/19/17   Janece Canterbury, MD  hydrALAZINE (APRESOLINE) 100 MG tablet Take 1 tablet (100 mg total) by mouth 3 (three) times daily. 07/19/17   Janece Canterbury, MD  levothyroxine (SYNTHROID, LEVOTHROID) 75 MCG tablet Take 75 mcg by mouth daily before breakfast.    [provider]  losartan (COZAAR) 100 MG tablet Take 100 mg by mouth daily.    [provider]  lubiprostone (AMITIZA) 8 MCG capsule Take 8 mcg by mouth daily.    [provider]  Lutein-Zeaxanthin 20-1 MG CAPS Take 1 capsule by mouth daily.    [provider]  Multiple Vitamins-Minerals (CENTRUM SILVER 50+WOMEN) TABS Take 1 tablet by mouth daily.    [provider]  Omega-3 Fatty Acids (FISH OIL) 1000 MG CAPS Take 1,000 mg by mouth every  evening.     [provider]  polyethylene glycol (MIRALAX / GLYCOLAX) packet Take 17 g by mouth daily as needed for mild constipation.    [provider]    Family History History reviewed. No pertinent family history.  Social History Social History   Tobacco Use  . Smoking status: Never Smoker  . Smokeless tobacco: Never Used  Substance Use Topics  . Alcohol use: No  . Drug use: No     Allergies   Codeine and Vancomycin   Review of Systems Review of Systems  Respiratory: Negative for shortness of breath.   Cardiovascular: Negative for chest  pain.  Gastrointestinal: Negative for nausea and vomiting.  Musculoskeletal: Negative for back pain.  Skin: Positive for wound.  Neurological: Negative for syncope and headaches.  All other systems reviewed and are negative.    Physical Exam Updated Vital Signs BP (!) 152/70 (BP Location: Right Arm)   Pulse (!) 57   Temp 97.8 F (36.6 C) (Oral)   Resp 14   Wt 54.4 kg   SpO2 99%   BMI 21.95 kg/m   Physical Exam  Constitutional: She is oriented to person, place, and time. She appears well-developed and well-nourished.  Elderly, pleasant, nontoxic-appearing  HENT:  Head: Normocephalic.  Small hematoma noted just superior to the occiput, no abrasions or skin tears noted  Eyes: Pupils are equal, round, and reactive to light. EOM are normal.  Neck: Normal range of motion. Neck supple.  Cardiovascular: Normal rate, regular rhythm and normal heart sounds.  Pulmonary/Chest: Effort normal and breath sounds normal. No respiratory distress. She has no wheezes.  Abdominal: Soft. There is no tenderness.  Musculoskeletal: Normal range of motion. She exhibits no edema or deformity.  Neurological: She is alert and oriented to person, place, and time.  Skin: Skin is warm and dry.  Psychiatric: She has a normal mood and affect.  Nursing note and vitals reviewed.    ED Treatments / Results  Labs (all labs ordered are listed, but only abnormal results are displayed) Labs Reviewed - No data to display  EKG None  Radiology Ct Head Wo Contrast  Result Date: 02/04/2018 CLINICAL DATA:  84 year old female with C-spine trauma. EXAM: CT HEAD WITHOUT CONTRAST CT CERVICAL SPINE WITHOUT CONTRAST TECHNIQUE: Multidetector CT imaging of the head and cervical spine was performed following the standard protocol without intravenous contrast. Multiplanar CT image reconstructions of the cervical spine were also generated. COMPARISON:  Head CT dated 05/03/2017 and 06/23/2016 FINDINGS: CT HEAD FINDINGS  Brain: There is mild to moderate age-related atrophy and chronic microvascular ischemic changes. There is no acute intracranial hemorrhage. No mass effect or midline shift. No extra-axial fluid collection. Vascular: No hyperdense vessel or unexpected calcification. Skull: Normal. Negative for fracture or focal lesion. Sinuses/Orbits: No acute finding. Other: None CT CERVICAL SPINE FINDINGS Alignment: No acute subluxation.  Grade 1 C7-T1 anterolisthesis. Skull base and vertebrae: Nondisplaced fractures of the posterior ring of C1 bilaterally. These fractures are new since the CT of 06/23/2016. However, there is apparent sclerotic changes of the fracture involving the right ring and therefore age indeterminate correlation with clinical exam and point tenderness recommended. MRI may provide better characterization and determination of acute or chronic nature of the fracture. No other acute fracture noted. No vertebral body fractures. Soft tissues and spinal canal: No prevertebral fluid or swelling. No visible canal hematoma. Disc levels: Multilevel degenerative changes with disc space narrowing and endplate irregularity. Multilevel facet hypertrophy on the left primarily  at C4-C5. Upper chest: Negative. Other: Bilateral carotid bulb calcified plaques. IMPRESSION: 1. No acute intracranial pathology. 2. Age indeterminate nondisplaced fractures of the C1 posterior ring, new since the study of 06/23/2016. Clinical correlation is recommended. These results were called by telephone at the time of interpretation on 02/04/2018 at 12:39 am to Dr. Thayer Jew , who verbally acknowledged these results. Electronically Signed   By: Anner Crete M.D.   On: 02/04/2018 00:41   Ct Cervical Spine Wo Contrast  Result Date: 02/04/2018 CLINICAL DATA:  84 year old female with C-spine trauma. EXAM: CT HEAD WITHOUT CONTRAST CT CERVICAL SPINE WITHOUT CONTRAST TECHNIQUE: Multidetector CT imaging of the head and cervical spine was  performed following the standard protocol without intravenous contrast. Multiplanar CT image reconstructions of the cervical spine were also generated. COMPARISON:  Head CT dated 05/03/2017 and 06/23/2016 FINDINGS: CT HEAD FINDINGS Brain: There is mild to moderate age-related atrophy and chronic microvascular ischemic changes. There is no acute intracranial hemorrhage. No mass effect or midline shift. No extra-axial fluid collection. Vascular: No hyperdense vessel or unexpected calcification. Skull: Normal. Negative for fracture or focal lesion. Sinuses/Orbits: No acute finding. Other: None CT CERVICAL SPINE FINDINGS Alignment: No acute subluxation.  Grade 1 C7-T1 anterolisthesis. Skull base and vertebrae: Nondisplaced fractures of the posterior ring of C1 bilaterally. These fractures are new since the CT of 06/23/2016. However, there is apparent sclerotic changes of the fracture involving the right ring and therefore age indeterminate correlation with clinical exam and point tenderness recommended. MRI may provide better characterization and determination of acute or chronic nature of the fracture. No other acute fracture noted. No vertebral body fractures. Soft tissues and spinal canal: No prevertebral fluid or swelling. No visible canal hematoma. Disc levels: Multilevel degenerative changes with disc space narrowing and endplate irregularity. Multilevel facet hypertrophy on the left primarily at C4-C5. Upper chest: Negative. Other: Bilateral carotid bulb calcified plaques. IMPRESSION: 1. No acute intracranial pathology. 2. Age indeterminate nondisplaced fractures of the C1 posterior ring, new since the study of 06/23/2016. Clinical correlation is recommended. These results were called by telephone at the time of interpretation on 02/04/2018 at 12:39 am to Dr. Thayer Jew , who verbally acknowledged these results. Electronically Signed   By: Anner Crete M.D.   On: 02/04/2018 00:41     Procedures Procedures (including critical care time)  Medications Ordered in ED Medications - No data to display   Initial Impression / Assessment and Plan / ED Course  I have reviewed the triage vital signs and the nursing notes.  Pertinent labs & imaging results that were available during my care of the patient were reviewed by me and considered in my medical decision making (see chart for details).     She presents following a fall.  She recalls events leading up to the fall.  She is overall nontoxic-appearing.  She has a small hematoma but otherwise is nontoxic-appearing.  CT scans obtained.  No intracranial abnormality noted.  She does have an age-indeterminate C1 fracture.  Her son states that she has had multiple falls over the last 1 to 2 years so which have not been evaluated.  Patient denies any current neck pain.  I did discuss with neurosurgery who recommended no clinical intervention if patient is without symptoms.  She is neurologically intact as well.  Will discharge home.  After history, exam, and medical workup I feel the patient has been appropriately medically screened and is safe for discharge home. Pertinent diagnoses were discussed  with the patient. Patient was given return precautions.   Final Clinical Impressions(s) / ED Diagnoses   Final diagnoses:  Fall, initial encounter  Hematoma of scalp, initial encounter  Closed nondisplaced fracture of first cervical vertebra, unspecified fracture morphology, initial encounter [facility])    ED Discharge Orders    None       Merryl Hacker, MD 02/04/18 0140

## 2018-03-30 DIAGNOSIS — Z23 Encounter for immunization: Secondary | ICD-10-CM | POA: Diagnosis not present

## 2018-06-22 DIAGNOSIS — I10 Essential (primary) hypertension: Secondary | ICD-10-CM | POA: Diagnosis not present

## 2018-06-22 DIAGNOSIS — E7849 Other hyperlipidemia: Secondary | ICD-10-CM | POA: Diagnosis not present

## 2018-06-22 DIAGNOSIS — Z6823 Body mass index (BMI) 23.0-23.9, adult: Secondary | ICD-10-CM | POA: Diagnosis not present

## 2018-06-22 DIAGNOSIS — M5106 Intervertebral disc disorders with myelopathy, lumbar region: Secondary | ICD-10-CM | POA: Diagnosis not present

## 2018-06-22 DIAGNOSIS — M1990 Unspecified osteoarthritis, unspecified site: Secondary | ICD-10-CM | POA: Diagnosis not present

## 2018-06-22 DIAGNOSIS — R269 Unspecified abnormalities of gait and mobility: Secondary | ICD-10-CM | POA: Diagnosis not present

## 2018-06-22 DIAGNOSIS — Z79899 Other long term (current) drug therapy: Secondary | ICD-10-CM | POA: Diagnosis not present

## 2018-06-22 DIAGNOSIS — N183 Chronic kidney disease, stage 3 (moderate): Secondary | ICD-10-CM | POA: Diagnosis not present

## 2018-06-22 DIAGNOSIS — D638 Anemia in other chronic diseases classified elsewhere: Secondary | ICD-10-CM | POA: Diagnosis not present

## 2018-06-22 DIAGNOSIS — I129 Hypertensive chronic kidney disease with stage 1 through stage 4 chronic kidney disease, or unspecified chronic kidney disease: Secondary | ICD-10-CM | POA: Diagnosis not present

## 2018-06-22 DIAGNOSIS — E038 Other specified hypothyroidism: Secondary | ICD-10-CM | POA: Diagnosis not present

## 2018-11-26 DIAGNOSIS — Z20828 Contact with and (suspected) exposure to other viral communicable diseases: Secondary | ICD-10-CM | POA: Diagnosis not present

## 2018-11-27 DIAGNOSIS — Z20828 Contact with and (suspected) exposure to other viral communicable diseases: Secondary | ICD-10-CM | POA: Diagnosis not present

## 2018-12-14 DIAGNOSIS — G547 Phantom limb syndrome without pain: Secondary | ICD-10-CM | POA: Diagnosis not present

## 2018-12-14 DIAGNOSIS — R269 Unspecified abnormalities of gait and mobility: Secondary | ICD-10-CM | POA: Diagnosis not present

## 2018-12-14 DIAGNOSIS — Z79899 Other long term (current) drug therapy: Secondary | ICD-10-CM | POA: Diagnosis not present

## 2018-12-14 DIAGNOSIS — E039 Hypothyroidism, unspecified: Secondary | ICD-10-CM | POA: Diagnosis not present

## 2018-12-14 DIAGNOSIS — I129 Hypertensive chronic kidney disease with stage 1 through stage 4 chronic kidney disease, or unspecified chronic kidney disease: Secondary | ICD-10-CM | POA: Diagnosis not present

## 2018-12-14 DIAGNOSIS — D638 Anemia in other chronic diseases classified elsewhere: Secondary | ICD-10-CM | POA: Diagnosis not present

## 2018-12-14 DIAGNOSIS — E785 Hyperlipidemia, unspecified: Secondary | ICD-10-CM | POA: Diagnosis not present

## 2018-12-14 DIAGNOSIS — Z0000 Encounter for general adult medical examination without abnormal findings: Secondary | ICD-10-CM | POA: Diagnosis not present

## 2018-12-14 DIAGNOSIS — M1990 Unspecified osteoarthritis, unspecified site: Secondary | ICD-10-CM | POA: Diagnosis not present

## 2018-12-14 DIAGNOSIS — M5106 Intervertebral disc disorders with myelopathy, lumbar region: Secondary | ICD-10-CM | POA: Diagnosis not present

## 2018-12-14 DIAGNOSIS — N183 Chronic kidney disease, stage 3 (moderate): Secondary | ICD-10-CM | POA: Diagnosis not present

## 2018-12-14 DIAGNOSIS — I10 Essential (primary) hypertension: Secondary | ICD-10-CM | POA: Diagnosis not present

## 2019-04-09 DIAGNOSIS — Z20828 Contact with and (suspected) exposure to other viral communicable diseases: Secondary | ICD-10-CM | POA: Diagnosis not present

## 2019-04-10 DIAGNOSIS — Z20828 Contact with and (suspected) exposure to other viral communicable diseases: Secondary | ICD-10-CM | POA: Diagnosis not present

## 2019-04-16 DIAGNOSIS — Z20828 Contact with and (suspected) exposure to other viral communicable diseases: Secondary | ICD-10-CM | POA: Diagnosis not present

## 2019-04-17 DIAGNOSIS — Z20828 Contact with and (suspected) exposure to other viral communicable diseases: Secondary | ICD-10-CM | POA: Diagnosis not present

## 2019-05-01 DIAGNOSIS — Z23 Encounter for immunization: Secondary | ICD-10-CM | POA: Diagnosis not present

## 2019-06-16 DIAGNOSIS — M5106 Intervertebral disc disorders with myelopathy, lumbar region: Secondary | ICD-10-CM | POA: Diagnosis not present

## 2019-06-16 DIAGNOSIS — I129 Hypertensive chronic kidney disease with stage 1 through stage 4 chronic kidney disease, or unspecified chronic kidney disease: Secondary | ICD-10-CM | POA: Diagnosis not present

## 2019-06-16 DIAGNOSIS — Z1331 Encounter for screening for depression: Secondary | ICD-10-CM | POA: Diagnosis not present

## 2019-06-16 DIAGNOSIS — N1832 Chronic kidney disease, stage 3b: Secondary | ICD-10-CM | POA: Diagnosis not present

## 2019-06-16 DIAGNOSIS — E785 Hyperlipidemia, unspecified: Secondary | ICD-10-CM | POA: Diagnosis not present

## 2019-06-16 DIAGNOSIS — M1990 Unspecified osteoarthritis, unspecified site: Secondary | ICD-10-CM | POA: Diagnosis not present

## 2019-06-16 DIAGNOSIS — Z89512 Acquired absence of left leg below knee: Secondary | ICD-10-CM | POA: Diagnosis not present

## 2019-06-16 DIAGNOSIS — E039 Hypothyroidism, unspecified: Secondary | ICD-10-CM | POA: Diagnosis not present

## 2019-06-16 DIAGNOSIS — D638 Anemia in other chronic diseases classified elsewhere: Secondary | ICD-10-CM | POA: Diagnosis not present

## 2019-06-16 DIAGNOSIS — G547 Phantom limb syndrome without pain: Secondary | ICD-10-CM | POA: Diagnosis not present

## 2019-06-16 DIAGNOSIS — C50919 Malignant neoplasm of unspecified site of unspecified female breast: Secondary | ICD-10-CM | POA: Diagnosis not present

## 2019-06-24 DIAGNOSIS — Z20828 Contact with and (suspected) exposure to other viral communicable diseases: Secondary | ICD-10-CM | POA: Diagnosis not present

## 2019-06-25 DIAGNOSIS — Z20828 Contact with and (suspected) exposure to other viral communicable diseases: Secondary | ICD-10-CM | POA: Diagnosis not present

## 2019-06-30 DIAGNOSIS — Z23 Encounter for immunization: Secondary | ICD-10-CM | POA: Diagnosis not present

## 2019-07-22 IMAGING — DX DG ABD PORTABLE 1V
2 series · 2 of 2 positions shown · non-contrast
Comparison: 07/13/2017

CLINICAL DATA: Abdominal distention and pain

EXAM:
PORTABLE ABDOMEN - 1 VIEW

[abdomen kub (1 of 2)]
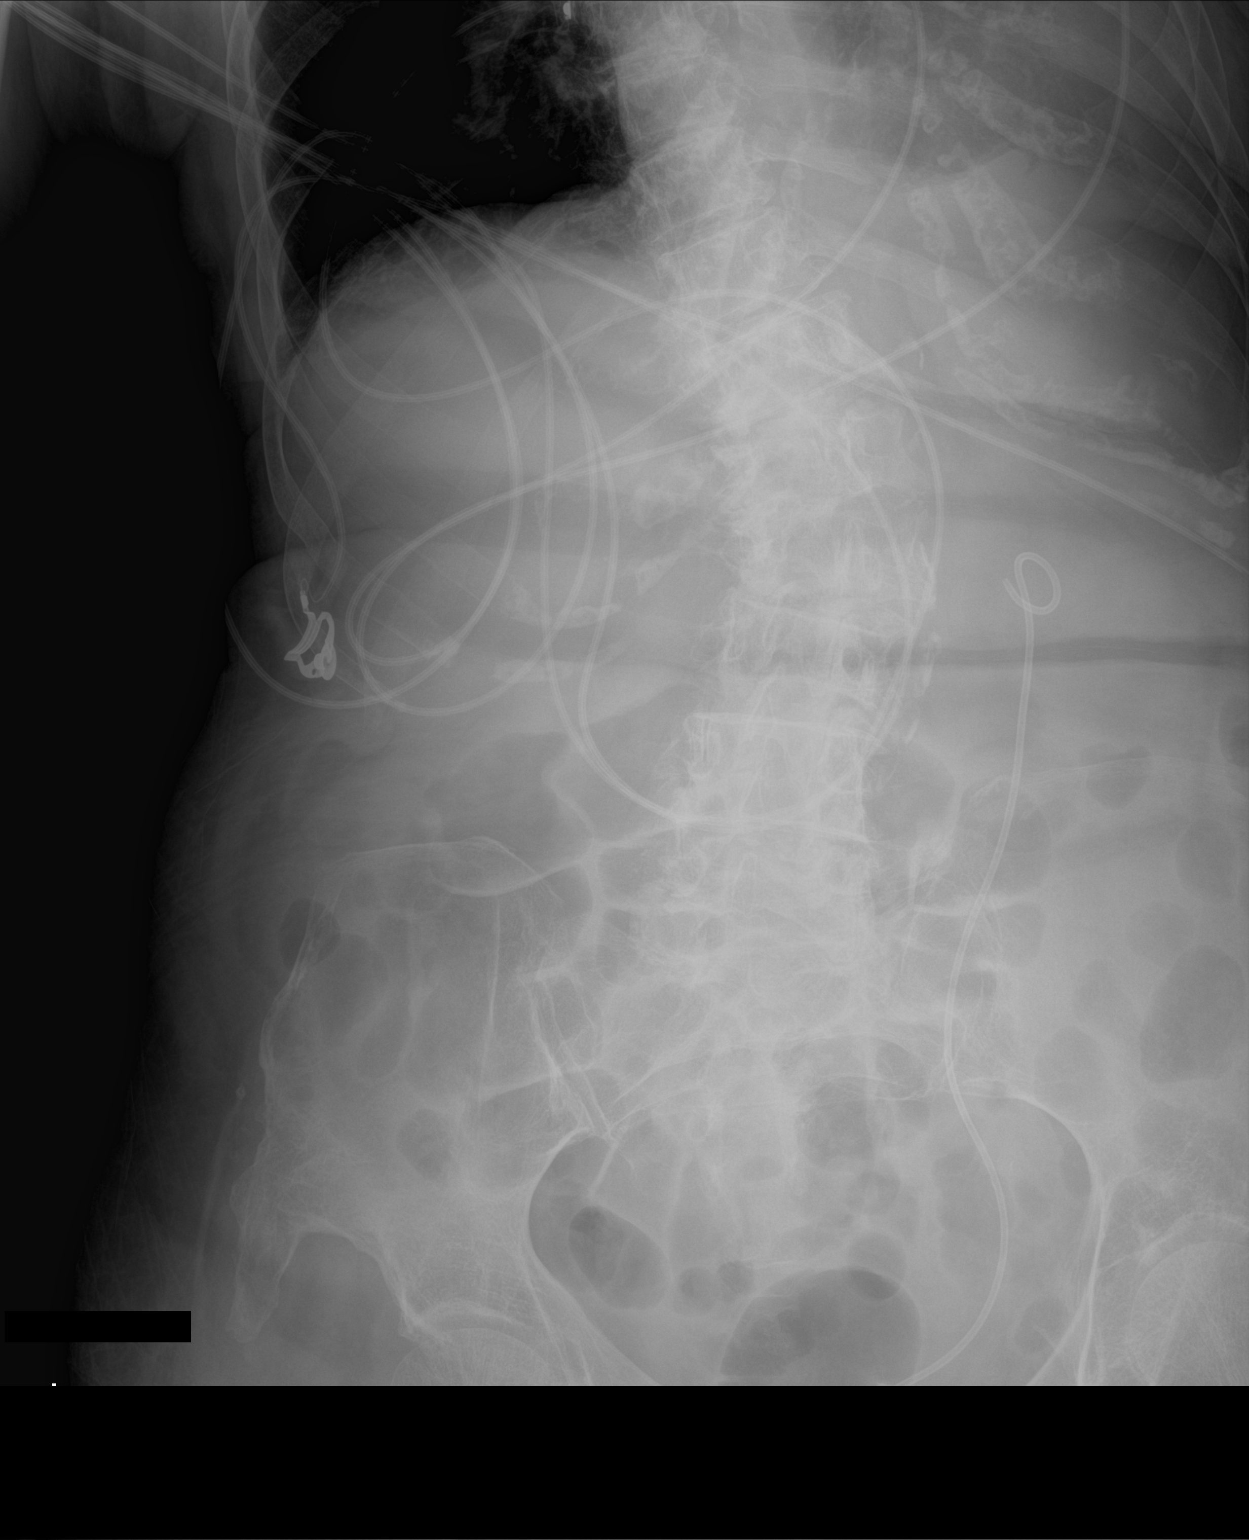

[abdomen kub (2 of 2)]
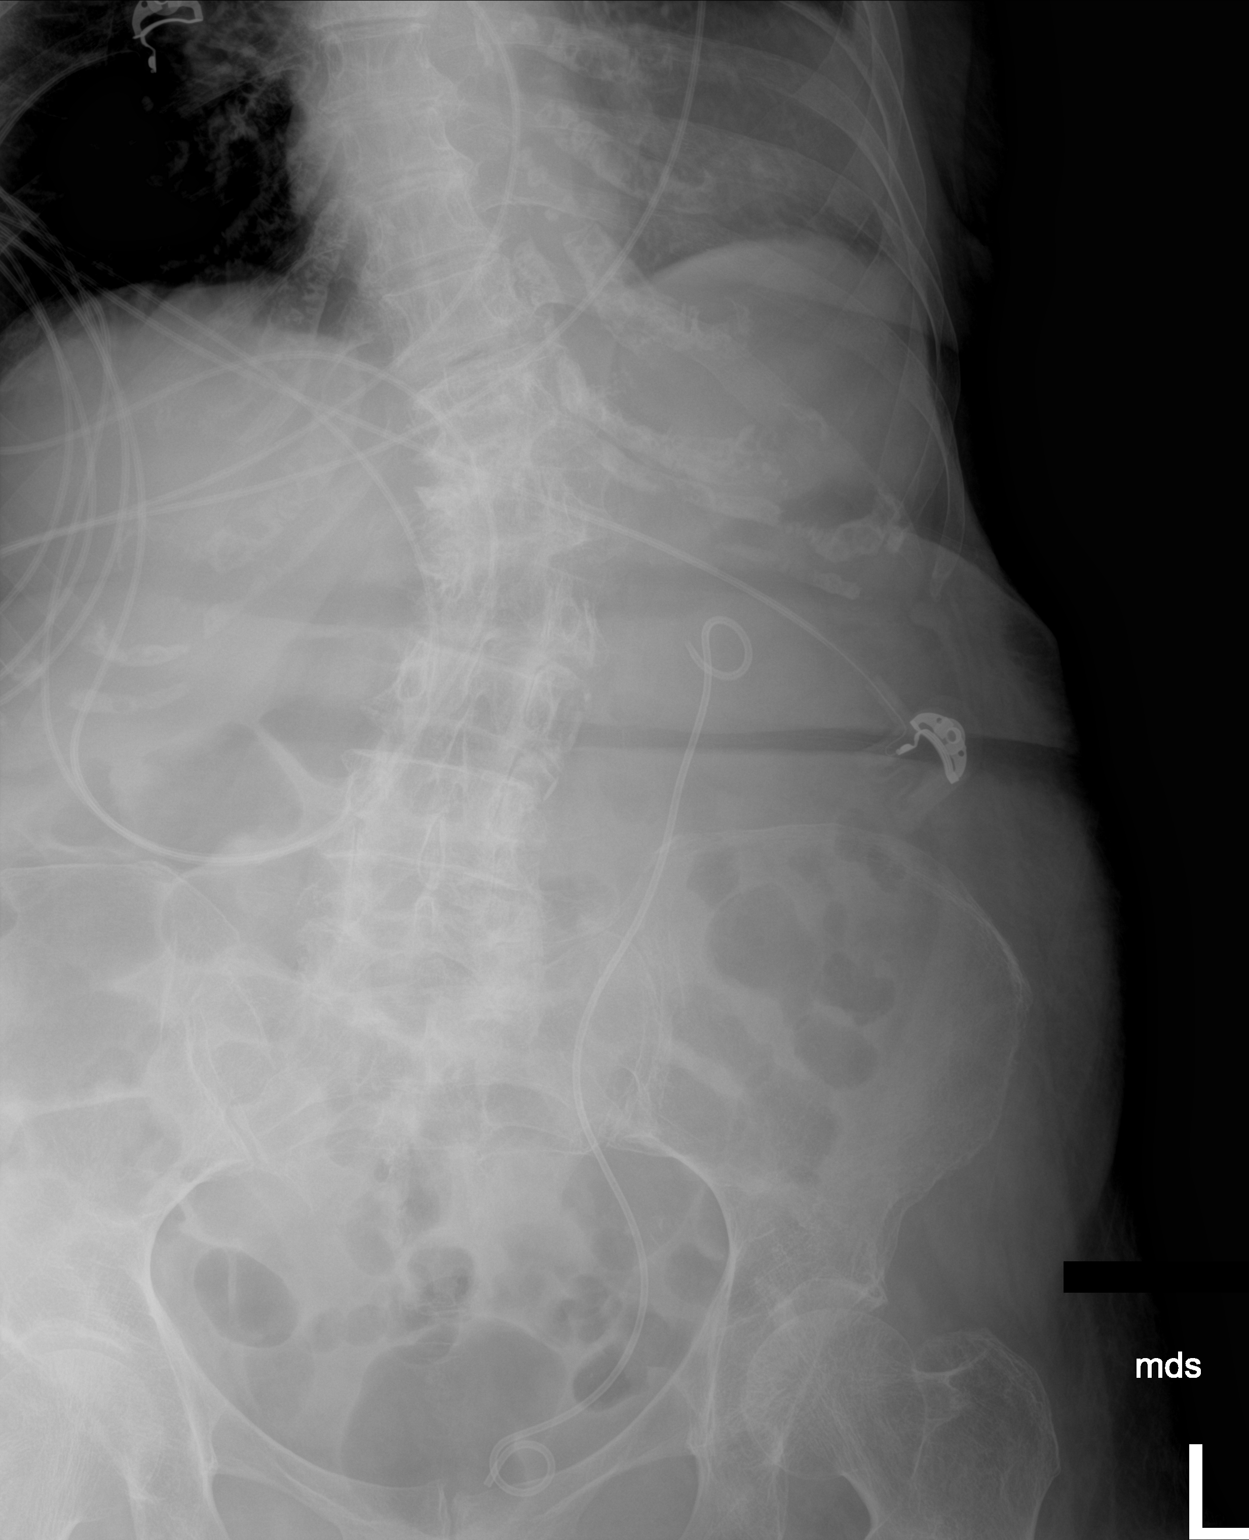

[2 of 2 positions shown; findings below may reference images not displayed]

FINDINGS: Scattered large and small bowel gas is noted. No obstructive changes
are seen. A left ureteral stent is noted in satisfactory position.
Degenerative changes of the lumbar spine are noted. No other focal
abnormality is seen.
IMPRESSION: No acute abnormality noted. Left ureteral stent in satisfactory
position.

## 2019-08-18 DIAGNOSIS — R300 Dysuria: Secondary | ICD-10-CM | POA: Diagnosis not present

## 2019-08-18 DIAGNOSIS — N390 Urinary tract infection, site not specified: Secondary | ICD-10-CM | POA: Diagnosis not present

## 2019-11-25 DIAGNOSIS — R300 Dysuria: Secondary | ICD-10-CM | POA: Diagnosis not present

## 2020-02-16 DIAGNOSIS — N1832 Chronic kidney disease, stage 3b: Secondary | ICD-10-CM | POA: Diagnosis not present

## 2020-02-16 DIAGNOSIS — Z0000 Encounter for general adult medical examination without abnormal findings: Secondary | ICD-10-CM | POA: Diagnosis not present

## 2020-02-16 DIAGNOSIS — I129 Hypertensive chronic kidney disease with stage 1 through stage 4 chronic kidney disease, or unspecified chronic kidney disease: Secondary | ICD-10-CM | POA: Diagnosis not present

## 2020-02-16 DIAGNOSIS — D638 Anemia in other chronic diseases classified elsewhere: Secondary | ICD-10-CM | POA: Diagnosis not present

## 2020-02-16 DIAGNOSIS — E039 Hypothyroidism, unspecified: Secondary | ICD-10-CM | POA: Diagnosis not present

## 2020-02-16 DIAGNOSIS — Z89512 Acquired absence of left leg below knee: Secondary | ICD-10-CM | POA: Diagnosis not present

## 2020-02-16 DIAGNOSIS — I10 Essential (primary) hypertension: Secondary | ICD-10-CM | POA: Diagnosis not present

## 2020-02-16 DIAGNOSIS — E785 Hyperlipidemia, unspecified: Secondary | ICD-10-CM | POA: Diagnosis not present

## 2020-02-16 DIAGNOSIS — K469 Unspecified abdominal hernia without obstruction or gangrene: Secondary | ICD-10-CM | POA: Diagnosis not present

## 2020-02-25 DIAGNOSIS — Z1152 Encounter for screening for COVID-19: Secondary | ICD-10-CM | POA: Diagnosis not present

## 2020-03-06 DIAGNOSIS — Z1152 Encounter for screening for COVID-19: Secondary | ICD-10-CM | POA: Diagnosis not present

## 2020-06-19 DIAGNOSIS — Z20828 Contact with and (suspected) exposure to other viral communicable diseases: Secondary | ICD-10-CM | POA: Diagnosis not present

## 2020-10-11 DIAGNOSIS — N390 Urinary tract infection, site not specified: Secondary | ICD-10-CM | POA: Diagnosis not present

## 2020-10-23 DIAGNOSIS — Z20828 Contact with and (suspected) exposure to other viral communicable diseases: Secondary | ICD-10-CM | POA: Diagnosis not present

## 2020-12-14 DIAGNOSIS — Z20828 Contact with and (suspected) exposure to other viral communicable diseases: Secondary | ICD-10-CM | POA: Diagnosis not present

## 2021-01-31 DIAGNOSIS — Z1339 Encounter for screening examination for other mental health and behavioral disorders: Secondary | ICD-10-CM | POA: Diagnosis not present

## 2021-01-31 DIAGNOSIS — R82998 Other abnormal findings in urine: Secondary | ICD-10-CM | POA: Diagnosis not present

## 2021-01-31 DIAGNOSIS — I10 Essential (primary) hypertension: Secondary | ICD-10-CM | POA: Diagnosis not present

## 2021-01-31 DIAGNOSIS — I129 Hypertensive chronic kidney disease with stage 1 through stage 4 chronic kidney disease, or unspecified chronic kidney disease: Secondary | ICD-10-CM | POA: Diagnosis not present

## 2021-01-31 DIAGNOSIS — D638 Anemia in other chronic diseases classified elsewhere: Secondary | ICD-10-CM | POA: Diagnosis not present

## 2021-01-31 DIAGNOSIS — Z0000 Encounter for general adult medical examination without abnormal findings: Secondary | ICD-10-CM | POA: Diagnosis not present

## 2021-01-31 DIAGNOSIS — N1832 Chronic kidney disease, stage 3b: Secondary | ICD-10-CM | POA: Diagnosis not present

## 2021-01-31 DIAGNOSIS — E039 Hypothyroidism, unspecified: Secondary | ICD-10-CM | POA: Diagnosis not present

## 2021-01-31 DIAGNOSIS — E785 Hyperlipidemia, unspecified: Secondary | ICD-10-CM | POA: Diagnosis not present

## 2021-01-31 DIAGNOSIS — Z89512 Acquired absence of left leg below knee: Secondary | ICD-10-CM | POA: Diagnosis not present

## 2021-01-31 DIAGNOSIS — Z1331 Encounter for screening for depression: Secondary | ICD-10-CM | POA: Diagnosis not present

## 2021-01-31 DIAGNOSIS — K469 Unspecified abdominal hernia without obstruction or gangrene: Secondary | ICD-10-CM | POA: Diagnosis not present

## 2021-03-02 DIAGNOSIS — Z23 Encounter for immunization: Secondary | ICD-10-CM | POA: Diagnosis not present

## 2021-03-07 ENCOUNTER — Inpatient Hospital Stay (HOSPITAL_COMMUNITY)
Admission: EM | Admit: 2021-03-07 | Discharge: 2021-03-18 | DRG: 689 | Disposition: A | Discharge status: Hospice | Payer: Medicare Other | Source: Skilled Nursing Facility | Attending: Internal Medicine | Admitting: Internal Medicine

## 2021-03-07 DIAGNOSIS — Z79899 Other long term (current) drug therapy: Secondary | ICD-10-CM

## 2021-03-07 DIAGNOSIS — Z781 Physical restraint status: Secondary | ICD-10-CM

## 2021-03-07 DIAGNOSIS — N390 Urinary tract infection, site not specified: Secondary | ICD-10-CM | POA: Diagnosis not present

## 2021-03-07 DIAGNOSIS — Z20822 Contact with and (suspected) exposure to covid-19: Secondary | ICD-10-CM | POA: Diagnosis not present

## 2021-03-07 DIAGNOSIS — D649 Anemia, unspecified: Secondary | ICD-10-CM | POA: Diagnosis present

## 2021-03-07 DIAGNOSIS — Y92129 Unspecified place in nursing home as the place of occurrence of the external cause: Secondary | ICD-10-CM

## 2021-03-07 DIAGNOSIS — N179 Acute kidney failure, unspecified: Secondary | ICD-10-CM | POA: Diagnosis present

## 2021-03-07 DIAGNOSIS — G9341 Metabolic encephalopathy: Secondary | ICD-10-CM | POA: Diagnosis present

## 2021-03-07 DIAGNOSIS — R296 Repeated falls: Secondary | ICD-10-CM | POA: Diagnosis present

## 2021-03-07 DIAGNOSIS — Z66 Do not resuscitate: Secondary | ICD-10-CM | POA: Diagnosis present

## 2021-03-07 DIAGNOSIS — N1832 Chronic kidney disease, stage 3b: Secondary | ICD-10-CM | POA: Diagnosis present

## 2021-03-07 DIAGNOSIS — N3000 Acute cystitis without hematuria: Secondary | ICD-10-CM

## 2021-03-07 DIAGNOSIS — R4182 Altered mental status, unspecified: Secondary | ICD-10-CM

## 2021-03-07 DIAGNOSIS — U071 COVID-19: Secondary | ICD-10-CM | POA: Diagnosis not present

## 2021-03-07 DIAGNOSIS — R001 Bradycardia, unspecified: Secondary | ICD-10-CM | POA: Diagnosis present

## 2021-03-07 DIAGNOSIS — I129 Hypertensive chronic kidney disease with stage 1 through stage 4 chronic kidney disease, or unspecified chronic kidney disease: Secondary | ICD-10-CM | POA: Diagnosis present

## 2021-03-07 DIAGNOSIS — F0390 Unspecified dementia without behavioral disturbance: Secondary | ICD-10-CM | POA: Diagnosis present

## 2021-03-07 DIAGNOSIS — Z87440 Personal history of urinary (tract) infections: Secondary | ICD-10-CM

## 2021-03-07 DIAGNOSIS — N183 Chronic kidney disease, stage 3 unspecified: Secondary | ICD-10-CM | POA: Diagnosis present

## 2021-03-07 DIAGNOSIS — Z89512 Acquired absence of left leg below knee: Secondary | ICD-10-CM

## 2021-03-07 DIAGNOSIS — M1612 Unilateral primary osteoarthritis, left hip: Secondary | ICD-10-CM | POA: Diagnosis not present

## 2021-03-07 DIAGNOSIS — Z9012 Acquired absence of left breast and nipple: Secondary | ICD-10-CM

## 2021-03-07 DIAGNOSIS — I10 Essential (primary) hypertension: Secondary | ICD-10-CM | POA: Diagnosis not present

## 2021-03-07 DIAGNOSIS — G9340 Encephalopathy, unspecified: Secondary | ICD-10-CM | POA: Diagnosis not present

## 2021-03-07 DIAGNOSIS — I160 Hypertensive urgency: Secondary | ICD-10-CM | POA: Diagnosis present

## 2021-03-07 DIAGNOSIS — W19XXXA Unspecified fall, initial encounter: Secondary | ICD-10-CM | POA: Diagnosis not present

## 2021-03-07 DIAGNOSIS — Z79890 Hormone replacement therapy (postmenopausal): Secondary | ICD-10-CM

## 2021-03-07 DIAGNOSIS — S12000A Unspecified displaced fracture of first cervical vertebra, initial encounter for closed fracture: Secondary | ICD-10-CM | POA: Diagnosis present

## 2021-03-07 DIAGNOSIS — R54 Age-related physical debility: Secondary | ICD-10-CM | POA: Diagnosis present

## 2021-03-07 DIAGNOSIS — Z885 Allergy status to narcotic agent status: Secondary | ICD-10-CM

## 2021-03-07 DIAGNOSIS — R5383 Other fatigue: Secondary | ICD-10-CM | POA: Diagnosis present

## 2021-03-07 DIAGNOSIS — S0003XA Contusion of scalp, initial encounter: Secondary | ICD-10-CM | POA: Diagnosis not present

## 2021-03-07 DIAGNOSIS — Z043 Encounter for examination and observation following other accident: Secondary | ICD-10-CM | POA: Diagnosis not present

## 2021-03-07 DIAGNOSIS — E039 Hypothyroidism, unspecified: Secondary | ICD-10-CM | POA: Diagnosis present

## 2021-03-07 DIAGNOSIS — D696 Thrombocytopenia, unspecified: Secondary | ICD-10-CM | POA: Diagnosis present

## 2021-03-07 DIAGNOSIS — H9190 Unspecified hearing loss, unspecified ear: Secondary | ICD-10-CM | POA: Diagnosis present

## 2021-03-07 DIAGNOSIS — Z888 Allergy status to other drugs, medicaments and biological substances status: Secondary | ICD-10-CM

## 2021-03-07 DIAGNOSIS — Z853 Personal history of malignant neoplasm of breast: Secondary | ICD-10-CM

## 2021-03-07 DIAGNOSIS — I441 Atrioventricular block, second degree: Secondary | ICD-10-CM | POA: Diagnosis present

## 2021-03-08 ENCOUNTER — Emergency Department (HOSPITAL_COMMUNITY): Payer: Medicare Other

## 2021-03-08 ENCOUNTER — Encounter (HOSPITAL_COMMUNITY): Payer: Self-pay | Admitting: Internal Medicine

## 2021-03-08 DIAGNOSIS — S0003XA Contusion of scalp, initial encounter: Secondary | ICD-10-CM | POA: Diagnosis not present

## 2021-03-08 DIAGNOSIS — I160 Hypertensive urgency: Secondary | ICD-10-CM | POA: Diagnosis present

## 2021-03-08 DIAGNOSIS — R001 Bradycardia, unspecified: Secondary | ICD-10-CM | POA: Diagnosis not present

## 2021-03-08 DIAGNOSIS — S12000A Unspecified displaced fracture of first cervical vertebra, initial encounter for closed fracture: Secondary | ICD-10-CM | POA: Diagnosis not present

## 2021-03-08 DIAGNOSIS — N3000 Acute cystitis without hematuria: Secondary | ICD-10-CM | POA: Diagnosis not present

## 2021-03-08 DIAGNOSIS — G9340 Encephalopathy, unspecified: Secondary | ICD-10-CM | POA: Diagnosis not present

## 2021-03-08 DIAGNOSIS — Z043 Encounter for examination and observation following other accident: Secondary | ICD-10-CM | POA: Diagnosis not present

## 2021-03-08 DIAGNOSIS — R4182 Altered mental status, unspecified: Secondary | ICD-10-CM | POA: Diagnosis not present

## 2021-03-08 DIAGNOSIS — M1612 Unilateral primary osteoarthritis, left hip: Secondary | ICD-10-CM | POA: Diagnosis not present

## 2021-03-08 DIAGNOSIS — N390 Urinary tract infection, site not specified: Secondary | ICD-10-CM | POA: Diagnosis present

## 2021-03-08 DIAGNOSIS — D696 Thrombocytopenia, unspecified: Secondary | ICD-10-CM | POA: Diagnosis present

## 2021-03-08 DIAGNOSIS — D649 Anemia, unspecified: Secondary | ICD-10-CM | POA: Diagnosis present

## 2021-03-08 LAB — URINALYSIS, ROUTINE W REFLEX MICROSCOPIC
Bilirubin Urine: NEGATIVE
Glucose, UA: NEGATIVE mg/dL
Hgb urine dipstick: NEGATIVE
Ketones, ur: NEGATIVE mg/dL
Nitrite: NEGATIVE
Protein, ur: NEGATIVE mg/dL
Specific Gravity, Urine: 1.017 (ref 1.005–1.030)
pH: 5 (ref 5.0–8.0)

## 2021-03-08 LAB — CBC WITH DIFFERENTIAL/PLATELET
Abs Immature Granulocytes: 0.02 10*3/uL (ref 0.00–0.07)
Basophils Absolute: 0.1 10*3/uL (ref 0.0–0.1)
Basophils Relative: 1 %
Eosinophils Absolute: 0.6 10*3/uL — ABNORMAL HIGH (ref 0.0–0.5)
Eosinophils Relative: 8 %
HCT: 36.3 % (ref 36.0–46.0)
Hemoglobin: 11.6 g/dL — ABNORMAL LOW (ref 12.0–15.0)
Immature Granulocytes: 0 %
Lymphocytes Relative: 35 %
Lymphs Abs: 2.5 10*3/uL (ref 0.7–4.0)
MCH: 30.8 pg (ref 26.0–34.0)
MCHC: 32 g/dL (ref 30.0–36.0)
MCV: 96.3 fL (ref 80.0–100.0)
Monocytes Absolute: 0.7 10*3/uL (ref 0.1–1.0)
Monocytes Relative: 10 %
Neutro Abs: 3.4 10*3/uL (ref 1.7–7.7)
Neutrophils Relative %: 46 %
Platelets: 138 10*3/uL — ABNORMAL LOW (ref 150–400)
RBC: 3.77 MIL/uL — ABNORMAL LOW (ref 3.87–5.11)
RDW: 12.1 % (ref 11.5–15.5)
WBC: 7.3 10*3/uL (ref 4.0–10.5)
nRBC: 0 % (ref 0.0–0.2)

## 2021-03-08 LAB — COMPREHENSIVE METABOLIC PANEL
ALT: 13 U/L (ref 0–44)
AST: 21 U/L (ref 15–41)
Albumin: 3.4 g/dL — ABNORMAL LOW (ref 3.5–5.0)
Alkaline Phosphatase: 51 U/L (ref 38–126)
Anion gap: 6 (ref 5–15)
BUN: 26 mg/dL — ABNORMAL HIGH (ref 8–23)
CO2: 25 mmol/L (ref 22–32)
Calcium: 9.1 mg/dL (ref 8.9–10.3)
Chloride: 111 mmol/L (ref 98–111)
Creatinine, Ser: 1.33 mg/dL — ABNORMAL HIGH (ref 0.44–1.00)
GFR, Estimated: 36 mL/min — ABNORMAL LOW (ref >60)
Glucose, Bld: 102 mg/dL — ABNORMAL HIGH (ref 70–99)
Potassium: 4.3 mmol/L (ref 3.5–5.1)
Sodium: 142 mmol/L (ref 135–145)
Total Bilirubin: 0.4 mg/dL (ref 0.3–1.2)
Total Protein: 6.1 g/dL — ABNORMAL LOW (ref 6.5–8.1)

## 2021-03-08 LAB — RESP PANEL BY RT-PCR (FLU A&B, COVID) ARPGX2
Influenza A by PCR: NEGATIVE
Influenza B by PCR: NEGATIVE
SARS Coronavirus 2 by RT PCR: NEGATIVE

## 2021-03-08 LAB — TSH: TSH: 1.849 u[IU]/mL (ref 0.350–4.500)

## 2021-03-08 MED ORDER — HYDRALAZINE HCL 20 MG/ML IJ SOLN: 10.0000 mg | INTRAMUSCULAR | Status: DC | PRN

## 2021-03-08 MED ORDER — LOSARTAN POTASSIUM 50 MG PO TABS
100.0000 mg | ORAL_TABLET | Freq: Every day | ORAL | Status: DC
  Administered 2021-03-08 – 2021-03-11 (×4): 100 mg via ORAL
  Filled 2021-03-08 (×4): qty 2

## 2021-03-08 MED ORDER — AMLODIPINE BESYLATE 5 MG PO TABS
5.0000 mg | ORAL_TABLET | Freq: Every day | ORAL | Status: DC
  Administered 2021-03-08: 5 mg via ORAL
  Filled 2021-03-08: qty 1

## 2021-03-08 MED ORDER — FENTANYL 25 MCG/HR TD PT72
1.0000 | MEDICATED_PATCH | TRANSDERMAL | Status: DC
  Administered 2021-03-08 – 2021-03-17 (×4): 1 via TRANSDERMAL
  Filled 2021-03-08 (×4): qty 1

## 2021-03-08 MED ORDER — LORAZEPAM 2 MG/ML IJ SOLN
0.5000 mg | Freq: Once | INTRAMUSCULAR | Status: AC
  Administered 2021-03-08: 0.5 mg via INTRAVENOUS
  Filled 2021-03-08: qty 1

## 2021-03-08 MED ORDER — ONDANSETRON HCL 4 MG PO TABS
4.0000 mg | ORAL_TABLET | Freq: Three times a day (TID) | ORAL | Status: DC | PRN
  Administered 2021-03-16: 4 mg via ORAL
  Filled 2021-03-08: qty 1

## 2021-03-08 MED ORDER — MELATONIN 3 MG PO TABS
3.0000 mg | ORAL_TABLET | Freq: Every evening | ORAL | Status: DC | PRN
  Administered 2021-03-13 – 2021-03-17 (×4): 3 mg via ORAL
  Filled 2021-03-08 (×4): qty 1

## 2021-03-08 MED ORDER — OMEGA-3-ACID ETHYL ESTERS 1 G PO CAPS
1.0000 g | ORAL_CAPSULE | Freq: Every day | ORAL | Status: DC
  Administered 2021-03-12: 1 g via ORAL
  Filled 2021-03-08 (×10): qty 1

## 2021-03-08 MED ORDER — SODIUM CHLORIDE 0.9 % IV SOLN
1.0000 g | Freq: Once | INTRAVENOUS | Status: AC
  Administered 2021-03-08: 1 g via INTRAVENOUS
  Filled 2021-03-08: qty 10

## 2021-03-08 MED ORDER — ACETAMINOPHEN 650 MG RE SUPP
650.0000 mg | Freq: Four times a day (QID) | RECTAL | Status: DC | PRN
  Administered 2021-03-14: 650 mg via RECTAL
  Filled 2021-03-08: qty 1

## 2021-03-08 MED ORDER — SENNA 8.6 MG PO TABS
8.6000 mg | ORAL_TABLET | Freq: Every day | ORAL | Status: DC
  Administered 2021-03-08 – 2021-03-17 (×5): 8.6 mg via ORAL
  Filled 2021-03-08 (×10): qty 1

## 2021-03-08 MED ORDER — SODIUM CHLORIDE 0.9 % IV SOLN: INTRAVENOUS | Status: AC

## 2021-03-08 MED ORDER — LEVOTHYROXINE SODIUM 75 MCG PO TABS
75.0000 ug | ORAL_TABLET | Freq: Every day | ORAL | Status: DC
  Administered 2021-03-09 – 2021-03-17 (×4): 75 ug via ORAL
  Filled 2021-03-08 (×9): qty 1

## 2021-03-08 MED ORDER — HYDROCODONE-ACETAMINOPHEN 5-325 MG PO TABS
1.0000 | ORAL_TABLET | Freq: Every day | ORAL | Status: DC | PRN
  Administered 2021-03-10 – 2021-03-13 (×2): 1 via ORAL
  Filled 2021-03-08 (×4): qty 1

## 2021-03-08 MED ORDER — ACETAMINOPHEN 325 MG PO TABS
650.0000 mg | ORAL_TABLET | Freq: Four times a day (QID) | ORAL | Status: DC | PRN
  Administered 2021-03-16: 650 mg via ORAL
  Filled 2021-03-08 (×3): qty 2

## 2021-03-08 MED ORDER — POLYETHYLENE GLYCOL 3350 17 G PO PACK
17.0000 g | PACK | Freq: Every day | ORAL | Status: DC
  Administered 2021-03-09 – 2021-03-16 (×6): 17 g via ORAL
  Filled 2021-03-08 (×8): qty 1

## 2021-03-08 MED ORDER — HEPARIN SODIUM (PORCINE) 5000 UNIT/ML IJ SOLN
5000.0000 [IU] | Freq: Three times a day (TID) | INTRAMUSCULAR | Status: DC
Start: 2021-03-08 — End: 2021-03-18
  Administered 2021-03-08 – 2021-03-18 (×23): 5000 [IU] via SUBCUTANEOUS
  Filled 2021-03-08 (×27): qty 1

## 2021-03-08 NOTE — Care Plan (Signed)
This 87 years old female with PMH significant for hypertension, hypothyroidism, chronic kidney disease stage III, anemia and thrombocytopenia presented to the ED s/p unwitnessed fall at the living facility.  In the ED She is found to be confused and CT head and C-spine and x-ray pelvis were completed.  CT C-spine shows C1 ring fracture.  ER physician has discussed with on-call neurosurgery, recommended to place patient on cervical collar and follow-up outpatient with neurosurgery. Patient EKG is concerning for second-degree AV block.  Cardiology consulted , recommended to continue telemetry and repeat echocardiogram.  Patient has been doing fine.

## 2021-03-08 NOTE — ED Notes (Signed)
Daughter assisted pt to restroom. Provided pt with slip resistant sock.

## 2021-03-08 NOTE — H&P (Signed)
History and Physical    Alexandra Alexander BOF:[id number] DOB: 1922/02/03 DOA: 03/07/2021  PCP: [name], MD  Patient coming from: [facility].  Chief Complaint: Fall.  History obtained from patient's son who is at the bedside.  HPI: Alexandra Alexander is a 87 y.o. female with history of hypertension, hypothyroidism, chronic kidney disease stage III, anemia and thrombocytopenia had an unwitnessed fall at the living facility and was brought to the ER.  Is not sure how she fell.  ED Course: In the ER patient appears confused and CT head and C-spine and x-ray of the pelvis were done.  CT C-spine shows C1 ring fracture.  ER physician discussed with on-call neurosurgery team who advised placing patient on cervical collar and follow-up as outpatient with neurosurgery.  Patient appears confused blood pressure is elevated UA is concerning for UTI.  EKG also shows bradycardia with concerning features for AV nodal block.  Patient was placed on ceftriaxone.  COVID test was negative.  Review of Systems: As per HPI, rest all negative.   Past Medical History:  Diagnosis Date   Cancer ([place]) breast   H/O mastectomy right   Hypertension    Hypothyroidism     Past Surgical History:  Procedure Laterality Date   ABDOMINAL HYSTERECTOMY     BREAST SURGERY     CYSTOSCOPY WITH STENT PLACEMENT Left 07/13/2017   Procedure: CYSTOSCOPY, LEFT RETROGRADE, LEFT URETEROSCOPY WITH LEFT URETERAL STENT PLACEMENT;  Surgeon: Irine Seal, MD;  Location: [facility];  Service: Urology;  Laterality: Left;   CYSTOSCOPY/URETEROSCOPY/HOLMIUM LASER/STENT PLACEMENT Left 07/22/2017   Procedure: CYSTOSCOPY LEFT URETEROSCOPY  POSSIBLE /HOLMIUM LASER/STENT EXCHANGE;  Surgeon: Irine Seal, MD;  Location: [facility];  Service: Urology;  Laterality: Left;   LEG AMPUTATION       reports that she has never smoked. She has never used smokeless tobacco. She reports that she does not drink alcohol and does not use  drugs.  Allergies  Allergen Reactions   Codeine Hives and Itching   Vancomycin Itching    Made the patient "feel like she was crawling out of her skin"    History reviewed. No pertinent family history.  Prior to Admission medications   Medication Sig Start Date End Date Taking? Authorizing Provider  acetaminophen (TYLENOL) 325 MG tablet Take 650 mg by mouth in the morning and at bedtime.   Yes [provider]  amLODipine (NORVASC) 5 MG tablet Take 5 mg by mouth daily. 02/26/21  Yes [provider]  fentaNYL (DURAGESIC) 25 MCG/HR Place 1 patch onto the skin every 3 (three) days. 02/20/21  Yes [provider]  HYDROcodone-acetaminophen (NORCO/VICODIN) 5-325 MG tablet Take 1 tablet by mouth daily as needed for moderate pain.   Yes [provider]  levothyroxine (SYNTHROID, LEVOTHROID) 75 MCG tablet Take 75 mcg by mouth daily before breakfast.   Yes [provider]  losartan (COZAAR) 100 MG tablet Take 100 mg by mouth daily.   Yes [provider]  melatonin 3 MG TABS tablet Take 3 mg by mouth at bedtime as needed (sleep).   Yes [provider]  Multiple Vitamins-Minerals (CERTA-VITE PO) Take 1 tablet by mouth daily.   Yes [provider]  Multiple Vitamins-Minerals (ICAPS PO) Take 1 capsule by mouth daily.   Yes [provider]  nebivolol (BYSTOLIC) 10 MG tablet Take 10 mg by mouth daily.   Yes [provider]  Omega-3 Fatty Acids (FISH OIL) 1000 MG CAPS Take 1,000 mg by  mouth daily.   Yes [provider]  ondansetron (ZOFRAN) 4 MG tablet Take 4 mg by mouth every 8 (eight) hours as needed for nausea or vomiting.   Yes [provider]  polyethylene glycol (MIRALAX / GLYCOLAX) packet Take 17 g by mouth daily.   Yes [provider]  senna (SENOKOT) 8.6 MG tablet Take 1 tablet by mouth daily.   Yes [provider]  fentaNYL (DURAGESIC - DOSED MCG/HR) 50 MCG/HR Place 1 patch  (50 mcg total) onto the skin every 3 (three) days. REMOVE OLD ONE PRIOR TO APPLYING NEW ONE Patient not taking: No sig reported 07/19/17   Janece Canterbury, MD  hydrALAZINE (APRESOLINE) 100 MG tablet Take 1 tablet (100 mg total) by mouth 3 (three) times daily. Patient not taking: Reported on 03/08/2021 07/19/17   Janece Canterbury, MD  MOLNUPIRAVIR PO Take 200 mg by mouth See admin instructions. Bid x 5 days Patient not taking: No sig reported    [provider]    Physical Exam: Constitutional: Moderately built and nourished. Vitals:   03/08/21 0005 03/08/21 0012 03/08/21 0355  BP:  (!) 203/93 (!) 197/98  Pulse:  71 (!) 58  Resp:   20  Temp:   (!) 97.1 F (36.2 C)  TempSrc:   Axillary  SpO2: 100% 98%    Eyes: Anicteric no pallor. ENMT: No discharge from the ears eyes nose and mouth. Neck: Cervical collar in place. Respiratory: No rhonchi or crepitations. Cardiovascular: S1-S2 heard. Abdomen: Soft nontender bowel sound present. Musculoskeletal: No edema.  Left BKA. Skin: No rash. Neurologic: Alert awake oriented to her name.  Moving all extremities. Psychiatric: Appears confused.   Labs on Admission: I have personally reviewed following labs and imaging studies  CBC: Recent Labs  Lab 03/08/21 0353  WBC 7.3  NEUTROABS 3.4  HGB 11.6*  HCT 36.3  MCV 96.3  PLT 151*   Basic Metabolic Panel: Recent Labs  Lab 03/08/21 0353  NA 142  K 4.3  CL 111  CO2 25  GLUCOSE 102*  BUN 26*  CREATININE 1.33*  CALCIUM 9.1   GFR: CrCl cannot be calculated (Unknown ideal weight.). Liver Function Tests: Recent Labs  Lab 03/08/21 0353  AST 21  ALT 13  ALKPHOS 51  BILITOT 0.4  PROT 6.1*  ALBUMIN 3.4*   No results for input(s): LIPASE, AMYLASE in the last 168 hours. No results for input(s): AMMONIA in the last 168 hours. Coagulation Profile: No results for input(s): INR, PROTIME in the last 168 hours. Cardiac Enzymes: No results for input(s): CKTOTAL, CKMB,  CKMBINDEX, TROPONINI in the last 168 hours. BNP (last 3 results) No results for input(s): PROBNP in the last 8760 hours. HbA1C: No results for input(s): HGBA1C in the last 72 hours. CBG: No results for input(s): GLUCAP in the last 168 hours. Lipid Profile: No results for input(s): CHOL, HDL, LDLCALC, TRIG, CHOLHDL, LDLDIRECT in the last 72 hours. Thyroid Function Tests: No results for input(s): TSH, T4TOTAL, FREET4, T3FREE, THYROIDAB in the last 72 hours. Anemia Panel: No results for input(s): VITAMINB12, FOLATE, FERRITIN, TIBC, IRON, RETICCTPCT in the last 72 hours. Urine analysis:    Component Value Date/Time   COLORURINE YELLOW 03/08/2021 0017   APPEARANCEUR CLEAR 03/08/2021 0017   LABSPEC 1.017 03/08/2021 0017   PHURINE 5.0 03/08/2021 0017   GLUCOSEU NEGATIVE 03/08/2021 0017   HGBUR NEGATIVE 03/08/2021 0017   BILIRUBINUR NEGATIVE 03/08/2021 0017   KETONESUR NEGATIVE 03/08/2021 0017   PROTEINUR NEGATIVE 03/08/2021 0017  NITRITE NEGATIVE 03/08/2021 0017   LEUKOCYTESUR MODERATE (A) 03/08/2021 0017   Sepsis Labs: @LABRCNTIP (procalcitonin:4,lacticidven:4) ) Recent Results (from the past 240 hour(s))  Resp Panel by RT-PCR (Flu A&B, Covid) Nasopharyngeal Swab     Status: None   Collection Time: 03/08/21  1:40 AM   Specimen: Nasopharyngeal Swab; Nasopharyngeal(NP) swabs in vial transport medium  Result Value Ref Range Status   SARS Coronavirus 2 by RT PCR NEGATIVE NEGATIVE Final    Comment: (NOTE) SARS-CoV-2 target nucleic acids are NOT DETECTED.  The SARS-CoV-2 RNA is generally detectable in upper respiratory specimens during the acute phase of infection. The lowest concentration of SARS-CoV-2 viral copies this assay can detect is 138 copies/mL. A negative result does not preclude SARS-Cov-2 infection and should not be used as the sole basis for treatment or other patient management decisions. A negative result may occur with  improper specimen collection/handling,  submission of specimen other than nasopharyngeal swab, presence of viral mutation(s) within the areas targeted by this assay, and inadequate number of viral copies(<138 copies/mL). A negative result must be combined with clinical observations, patient history, and epidemiological information. The expected result is Negative.  Fact Sheet for Patients:  EntrepreneurPulse.com.au  Fact Sheet for Healthcare Providers:  IncredibleEmployment.be  This test is no t yet approved or cleared by the [place] FDA and  has been authorized for detection and/or diagnosis of SARS-CoV-2 by FDA under an Emergency Use Authorization (EUA). This EUA will remain  in effect (meaning this test can be used) for the duration of the COVID-19 declaration under Section 564(b)(1) of the Act, 21 U.S.C.section 360bbb-3(b)(1), unless the authorization is terminated  or revoked sooner.       Influenza A by PCR NEGATIVE NEGATIVE Final   Influenza B by PCR NEGATIVE NEGATIVE Final    Comment: (NOTE) The Xpert Xpress SARS-CoV-2/FLU/RSV plus assay is intended as an aid in the diagnosis of influenza from Nasopharyngeal swab specimens and should not be used as a sole basis for treatment. Nasal washings and aspirates are unacceptable for Xpert Xpress SARS-CoV-2/FLU/RSV testing.  Fact Sheet for Patients: EntrepreneurPulse.com.au  Fact Sheet for Healthcare Providers: IncredibleEmployment.be  This test is not yet approved or cleared by the [place] FDA and has been authorized for detection and/or diagnosis of SARS-CoV-2 by FDA under an Emergency Use Authorization (EUA). This EUA will remain in effect (meaning this test can be used) for the duration of the COVID-19 declaration under Section 564(b)(1) of the Act, 21 U.S.C. section 360bbb-3(b)(1), unless the authorization is terminated or revoked.  Performed at [facility], [place] [address], [place], [place] [address]      Radiological Exams on Admission: DG Chest 2 View  Result Date: 03/08/2021 CLINICAL DATA:  Fall EXAM: CHEST - 2 VIEW COMPARISON:  None. FINDINGS: The heart size and mediastinal contours are within normal limits. Both lungs are clear. The visualized skeletal structures are unremarkable. IMPRESSION: No active cardiopulmonary disease. Electronically Signed   By: Ulyses Jarred M.D.   On: 03/08/2021 01:36   DG Pelvis 1-2 Views  Result Date: 03/08/2021 CLINICAL DATA:  Fall, altered mental status EXAM: PELVIS - 1-2 VIEW COMPARISON:  None FINDINGS: The osseous structures are diffusely osteopenic. No acute fracture or dislocation. Moderate left hip degenerative arthritis. Ossification in the region of the anterior superior iliac spine on the right likely relates to remote trauma or inflammation involving the sartorius or tensor fascial lata. IMPRESSION: No acute fracture or dislocation Electronically Signed   By: Cassandria Anger  Christa See M.D.   On: 03/08/2021 01:27   CT HEAD WO CONTRAST (5MM)  Result Date: 03/08/2021 CLINICAL DATA:  Fall EXAM: CT HEAD WITHOUT CONTRAST CT CERVICAL SPINE WITHOUT CONTRAST TECHNIQUE: Multidetector CT imaging of the head and cervical spine was performed following the standard protocol without intravenous contrast. Multiplanar CT image reconstructions of the cervical spine were also generated. COMPARISON:  02/04/2018 FINDINGS: CT HEAD FINDINGS Brain: There is no mass, hemorrhage or extra-axial collection. There is generalized atrophy without lobar predilection. There is hypoattenuation of the periventricular white matter, most commonly indicating chronic ischemic microangiopathy. Vascular: No abnormal hyperdensity of the major intracranial arteries or dural venous sinuses. No intracranial atherosclerosis. Skull: Small left frontal scalp hematoma.  No skull fracture. Sinuses/Orbits: No fluid levels or advanced mucosal thickening of the visualized paranasal  sinuses. No mastoid or middle ear effusion. The orbits are normal. CT CERVICAL SPINE FINDINGS Alignment: No static subluxation. Facets are aligned. Occipital condyles are normally positioned. Skull base and vertebrae: There are minimally displaced fractures at the left and right lateral aspects of the C1 ring are unchanged since 02/04/2018. No other cervical spine fracture. Soft tissues and spinal canal: No prevertebral fluid or swelling. No visible canal hematoma. Disc levels: No advanced spinal canal or neural foraminal stenosis. Upper chest: No pneumothorax, pulmonary nodule or pleural effusion. Other: Normal visualized paraspinal cervical soft tissues. IMPRESSION: 1. Minimally displaced fractures at the left and right lateral aspects of the C1 ring. 2. No acute intracranial abnormality. 3. Small left frontal scalp hematoma without skull fracture. Electronically Signed   By: Ulyses Jarred M.D.   On: 03/08/2021 01:58   CT Cervical Spine Wo Contrast  Result Date: 03/08/2021 CLINICAL DATA:  Fall EXAM: CT HEAD WITHOUT CONTRAST CT CERVICAL SPINE WITHOUT CONTRAST TECHNIQUE: Multidetector CT imaging of the head and cervical spine was performed following the standard protocol without intravenous contrast. Multiplanar CT image reconstructions of the cervical spine were also generated. COMPARISON:  02/04/2018 FINDINGS: CT HEAD FINDINGS Brain: There is no mass, hemorrhage or extra-axial collection. There is generalized atrophy without lobar predilection. There is hypoattenuation of the periventricular white matter, most commonly indicating chronic ischemic microangiopathy. Vascular: No abnormal hyperdensity of the major intracranial arteries or dural venous sinuses. No intracranial atherosclerosis. Skull: Small left frontal scalp hematoma.  No skull fracture. Sinuses/Orbits: No fluid levels or advanced mucosal thickening of the visualized paranasal sinuses. No mastoid or middle ear effusion. The orbits are normal. CT  CERVICAL SPINE FINDINGS Alignment: No static subluxation. Facets are aligned. Occipital condyles are normally positioned. Skull base and vertebrae: There are minimally displaced fractures at the left and right lateral aspects of the C1 ring are unchanged since 02/04/2018. No other cervical spine fracture. Soft tissues and spinal canal: No prevertebral fluid or swelling. No visible canal hematoma. Disc levels: No advanced spinal canal or neural foraminal stenosis. Upper chest: No pneumothorax, pulmonary nodule or pleural effusion. Other: Normal visualized paraspinal cervical soft tissues. IMPRESSION: 1. Minimally displaced fractures at the left and right lateral aspects of the C1 ring. 2. No acute intracranial abnormality. 3. Small left frontal scalp hematoma without skull fracture. Electronically Signed   By: Ulyses Jarred M.D.   On: 03/08/2021 01:58   DG Femur Min 2 Views Right  Result Date: 03/08/2021 CLINICAL DATA:  Fall EXAM: RIGHT FEMUR 2 VIEWS COMPARISON:  None. FINDINGS: There is no evidence of fracture or other focal bone lesions. Soft tissues are unremarkable. Exostosis of the right iliac wing. IMPRESSION: Negative. Electronically Signed  By: Ulyses Jarred M.D.   On: 03/08/2021 01:32    EKG: Independently reviewed.  Bradycardia concerning for second-degree AV block.  Assessment/Plan Principal Problem:   Acute encephalopathy Active Problems:   Stage 3 chronic kidney disease (HCC)   Hypothyroidism   Hypertensive urgency   Bradycardia   Acute lower UTI   Anemia   Thrombocytopenia (HCC)    Acute encephalopathy cause could be either postconcussive or UTI.  On ceftriaxone. Bradycardia -I discussed with on-call cardiologist at this time cardiology recommended getting repeat EKG.  Features are concerning for possible second-degree AV block.  We will hold Bystolic.  Monitor in telemetry.  Check TSH. Hypertensive urgency -we will keep patient on as needed IV hydralazine continue amlodipine and  ARB.  Holding Bystolic due to bradycardia.  See 2. C1 fracture for which neurosurgery recommended cervical collar and follow-up as outpatient with neurosurgery. Hypothyroidism on Synthroid.  Check TSH since patient has bradycardia. UTI on ceftriaxone.  Follow urine cultures. Chronic kidney disease stage III creatinine appears to be at baseline.  Follow metabolic panel. Chronic anemia and thrombocytopenia follow CBC.   DVT prophylaxis: Heparin. Code Status: DNR confirmed with patient's son. Family Communication: Patient's son. Disposition Plan: Back to facility when stable. Consults called: ER physician discussed with neurologist.  Discussed with cardiologist. Admission status: Observation.   Rise Patience MD [facility] Pager [phone].  If 7PM-7AM, please contact night-coverage [email or web address] Password [facility]  03/08/2021, 5:56 AM

## 2021-03-08 NOTE — ED Notes (Addendum)
Pt agitated, crying, stating "I can't breathe". Pt removed c-collar. RN made aware.

## 2021-03-08 NOTE — Consult Note (Signed)
Referring Physician: Shawna Clamp, MD  Alexandra Alexander is an 87 y.o. female.                       Chief Complaint: Cardiac dysrhythmia  HPI: 87 years old white female with HTN, hypothyroidism, CKD, IIIb, anemia and thrombocytopenia had fall at skilled nursing facility. CT C-spinr shows C1 ring fracture. Patient is hard of hearing and has dementia. She is not keeping her cervical collar on as advised by neurosurgery. Her EKG last night showed 2nd degree type 1 AV block. Repeat EKG this AM when she is awake is NSR with sinus arrhythmia. Patient unable to give much history. Echocardiogram in 2019 showed normal LV function with mild MR and moderate TR.  Past Medical History:  Diagnosis Date   Cancer ([place]) breast   H/O mastectomy right   Hypertension    Hypothyroidism       Past Surgical History:  Procedure Laterality Date   ABDOMINAL HYSTERECTOMY     BREAST SURGERY     CYSTOSCOPY WITH STENT PLACEMENT Left 07/13/2017   Procedure: CYSTOSCOPY, LEFT RETROGRADE, LEFT URETEROSCOPY WITH LEFT URETERAL STENT PLACEMENT;  Surgeon: Irine Seal, MD;  Location: [facility];  Service: Urology;  Laterality: Left;   CYSTOSCOPY/URETEROSCOPY/HOLMIUM LASER/STENT PLACEMENT Left 07/22/2017   Procedure: CYSTOSCOPY LEFT URETEROSCOPY  POSSIBLE /HOLMIUM LASER/STENT EXCHANGE;  Surgeon: Irine Seal, MD;  Location: [facility];  Service: Urology;  Laterality: Left;   LEG AMPUTATION      History reviewed. No pertinent family history. Social History:  reports that she has never smoked. She has never used smokeless tobacco. She reports that she does not drink alcohol and does not use drugs.  Allergies:  Allergies  Allergen Reactions   Codeine Hives and Itching   Vancomycin Itching    Made the patient "feel like she was crawling out of her skin"    (Not in a hospital admission)   Results for orders placed or performed during the hospital encounter of 03/07/21 (from the past 48 hour(s))  Urinalysis, Routine w reflex  microscopic Urine, Clean Catch     Status: Abnormal   Collection Time: 03/08/21 12:17 AM  Result Value Ref Range   Color, Urine YELLOW YELLOW   APPearance CLEAR CLEAR   Specific Gravity, Urine 1.017 1.005 - 1.030   pH 5.0 5.0 - 8.0   Glucose, UA NEGATIVE NEGATIVE mg/dL   Hgb urine dipstick NEGATIVE NEGATIVE   Bilirubin Urine NEGATIVE NEGATIVE   Ketones, ur NEGATIVE NEGATIVE mg/dL   Protein, ur NEGATIVE NEGATIVE mg/dL   Nitrite NEGATIVE NEGATIVE   Leukocytes,Ua MODERATE (A) NEGATIVE   RBC / HPF 0-5 0 - 5 RBC/hpf   WBC, UA 21-50 0 - 5 WBC/hpf   Bacteria, UA RARE (A) NONE SEEN   Squamous Epithelial / LPF 0-5 0 - 5   Mucus PRESENT     Comment: Performed at [facility], [address] [place], [place] [address]  Resp Panel by RT-PCR (Flu A&B, Covid) Nasopharyngeal Swab     Status: None   Collection Time: 03/08/21  1:40 AM   Specimen: Nasopharyngeal Swab; Nasopharyngeal(NP) swabs in vial transport medium  Result Value Ref Range   SARS Coronavirus 2 by RT PCR NEGATIVE NEGATIVE    Comment: (NOTE) SARS-CoV-2 target nucleic acids are NOT DETECTED.  The SARS-CoV-2 RNA is generally detectable in upper respiratory specimens during the acute phase of infection. The lowest concentration of SARS-CoV-2 viral copies this assay can detect is  138 copies/mL. A negative result does not preclude SARS-Cov-2 infection and should not be used as the sole basis for treatment or other patient management decisions. A negative result may occur with  improper specimen collection/handling, submission of specimen other than nasopharyngeal swab, presence of viral mutation(s) within the areas targeted by this assay, and inadequate number of viral copies(<138 copies/mL). A negative result must be combined with clinical observations, patient history, and epidemiological information. The expected result is Negative.  Fact Sheet for Patients:  EntrepreneurPulse.com.au  Fact Sheet for  Healthcare Providers:  IncredibleEmployment.be  This test is no t yet approved or cleared by the [place] FDA and  has been authorized for detection and/or diagnosis of SARS-CoV-2 by FDA under an Emergency Use Authorization (EUA). This EUA will remain  in effect (meaning this test can be used) for the duration of the COVID-19 declaration under Section 564(b)(1) of the Act, 21 U.S.C.section 360bbb-3(b)(1), unless the authorization is terminated  or revoked sooner.       Influenza A by PCR NEGATIVE NEGATIVE   Influenza B by PCR NEGATIVE NEGATIVE    Comment: (NOTE) The Xpert Xpress SARS-CoV-2/FLU/RSV plus assay is intended as an aid in the diagnosis of influenza from Nasopharyngeal swab specimens and should not be used as a sole basis for treatment. Nasal washings and aspirates are unacceptable for Xpert Xpress SARS-CoV-2/FLU/RSV testing.  Fact Sheet for Patients: EntrepreneurPulse.com.au  Fact Sheet for Healthcare Providers: IncredibleEmployment.be  This test is not yet approved or cleared by the [place] FDA and has been authorized for detection and/or diagnosis of SARS-CoV-2 by FDA under an Emergency Use Authorization (EUA). This EUA will remain in effect (meaning this test can be used) for the duration of the COVID-19 declaration under Section 564(b)(1) of the Act, 21 U.S.C. section 360bbb-3(b)(1), unless the authorization is terminated or revoked.  Performed at [facility], [place] [address], [place], [place] [address]   CBC with Differential     Status: Abnormal   Collection Time: 03/08/21  3:53 AM  Result Value Ref Range   WBC 7.3 4.0 - 10.5 K/uL   RBC 3.77 (L) 3.87 - 5.11 MIL/uL   Hemoglobin 11.6 (L) 12.0 - 15.0 g/dL   HCT 36.3 36.0 - 46.0 %   MCV 96.3 80.0 - 100.0 fL   MCH 30.8 26.0 - 34.0 pg   MCHC 32.0 30.0 - 36.0 g/dL   RDW 12.1 11.5 - 15.5 %   Platelets 138 (L) 150 - 400 K/uL   nRBC 0.0 0.0  - 0.2 %   Neutrophils Relative % 46 %   Neutro Abs 3.4 1.7 - 7.7 K/uL   Lymphocytes Relative 35 %   Lymphs Abs 2.5 0.7 - 4.0 K/uL   Monocytes Relative 10 %   Monocytes Absolute 0.7 0.1 - 1.0 K/uL   Eosinophils Relative 8 %   Eosinophils Absolute 0.6 (H) 0.0 - 0.5 K/uL   Basophils Relative 1 %   Basophils Absolute 0.1 0.0 - 0.1 K/uL   Immature Granulocytes 0 %   Abs Immature Granulocytes 0.02 0.00 - 0.07 K/uL    Comment: Performed at [facility] [address] [place], [place] [address]  Comprehensive metabolic panel     Status: Abnormal   Collection Time: 03/08/21  3:53 AM  Result Value Ref Range   Sodium 142 135 - 145 mmol/L   Potassium 4.3 3.5 - 5.1 mmol/L   Chloride 111 98 - 111 mmol/L   CO2 25 22 -  32 mmol/L   Glucose, Bld 102 (H) 70 - 99 mg/dL    Comment: Glucose reference range applies only to samples taken after fasting for at least 8 hours.   BUN 26 (H) 8 - 23 mg/dL   Creatinine, Ser 1.33 (H) 0.44 - 1.00 mg/dL   Calcium 9.1 8.9 - 10.3 mg/dL   Total Protein 6.1 (L) 6.5 - 8.1 g/dL   Albumin 3.4 (L) 3.5 - 5.0 g/dL   AST 21 15 - 41 U/L   ALT 13 0 - 44 U/L   Alkaline Phosphatase 51 38 - 126 U/L   Total Bilirubin 0.4 0.3 - 1.2 mg/dL   GFR, Estimated 36 (L) >60 mL/min    Comment: (NOTE) Calculated using the CKD-EPI Creatinine Equation (2021)    Anion gap 6 5 - 15    Comment: Performed at [facility], [place] [address], [place], [place] [address]   DG Chest 2 View  Result Date: 03/08/2021 CLINICAL DATA:  Fall EXAM: CHEST - 2 VIEW COMPARISON:  None. FINDINGS: The heart size and mediastinal contours are within normal limits. Both lungs are clear. The visualized skeletal structures are unremarkable. IMPRESSION: No active cardiopulmonary disease. Electronically Signed   By: Ulyses Jarred M.D.   On: 03/08/2021 01:36   DG Pelvis 1-2 Views  Result Date: 03/08/2021 CLINICAL DATA:  Fall, altered mental status EXAM: PELVIS - 1-2 VIEW COMPARISON:  None FINDINGS: The  osseous structures are diffusely osteopenic. No acute fracture or dislocation. Moderate left hip degenerative arthritis. Ossification in the region of the anterior superior iliac spine on the right likely relates to remote trauma or inflammation involving the sartorius or tensor fascial lata. IMPRESSION: No acute fracture or dislocation Electronically Signed   By: Fidela Salisbury M.D.   On: 03/08/2021 01:27   CT HEAD WO CONTRAST (5MM)  Result Date: 03/08/2021 CLINICAL DATA:  Fall EXAM: CT HEAD WITHOUT CONTRAST CT CERVICAL SPINE WITHOUT CONTRAST TECHNIQUE: Multidetector CT imaging of the head and cervical spine was performed following the standard protocol without intravenous contrast. Multiplanar CT image reconstructions of the cervical spine were also generated. COMPARISON:  02/04/2018 FINDINGS: CT HEAD FINDINGS Brain: There is no mass, hemorrhage or extra-axial collection. There is generalized atrophy without lobar predilection. There is hypoattenuation of the periventricular white matter, most commonly indicating chronic ischemic microangiopathy. Vascular: No abnormal hyperdensity of the major intracranial arteries or dural venous sinuses. No intracranial atherosclerosis. Skull: Small left frontal scalp hematoma.  No skull fracture. Sinuses/Orbits: No fluid levels or advanced mucosal thickening of the visualized paranasal sinuses. No mastoid or middle ear effusion. The orbits are normal. CT CERVICAL SPINE FINDINGS Alignment: No static subluxation. Facets are aligned. Occipital condyles are normally positioned. Skull base and vertebrae: There are minimally displaced fractures at the left and right lateral aspects of the C1 ring are unchanged since 02/04/2018. No other cervical spine fracture. Soft tissues and spinal canal: No prevertebral fluid or swelling. No visible canal hematoma. Disc levels: No advanced spinal canal or neural foraminal stenosis. Upper chest: No pneumothorax, pulmonary nodule or pleural  effusion. Other: Normal visualized paraspinal cervical soft tissues. IMPRESSION: 1. Minimally displaced fractures at the left and right lateral aspects of the C1 ring. 2. No acute intracranial abnormality. 3. Small left frontal scalp hematoma without skull fracture. Electronically Signed   By: Ulyses Jarred M.D.   On: 03/08/2021 01:58   CT Cervical Spine Wo Contrast  Result Date: 03/08/2021 CLINICAL DATA:  Fall EXAM: CT HEAD WITHOUT CONTRAST  CT CERVICAL SPINE WITHOUT CONTRAST TECHNIQUE: Multidetector CT imaging of the head and cervical spine was performed following the standard protocol without intravenous contrast. Multiplanar CT image reconstructions of the cervical spine were also generated. COMPARISON:  02/04/2018 FINDINGS: CT HEAD FINDINGS Brain: There is no mass, hemorrhage or extra-axial collection. There is generalized atrophy without lobar predilection. There is hypoattenuation of the periventricular white matter, most commonly indicating chronic ischemic microangiopathy. Vascular: No abnormal hyperdensity of the major intracranial arteries or dural venous sinuses. No intracranial atherosclerosis. Skull: Small left frontal scalp hematoma.  No skull fracture. Sinuses/Orbits: No fluid levels or advanced mucosal thickening of the visualized paranasal sinuses. No mastoid or middle ear effusion. The orbits are normal. CT CERVICAL SPINE FINDINGS Alignment: No static subluxation. Facets are aligned. Occipital condyles are normally positioned. Skull base and vertebrae: There are minimally displaced fractures at the left and right lateral aspects of the C1 ring are unchanged since 02/04/2018. No other cervical spine fracture. Soft tissues and spinal canal: No prevertebral fluid or swelling. No visible canal hematoma. Disc levels: No advanced spinal canal or neural foraminal stenosis. Upper chest: No pneumothorax, pulmonary nodule or pleural effusion. Other: Normal visualized paraspinal cervical soft tissues.  IMPRESSION: 1. Minimally displaced fractures at the left and right lateral aspects of the C1 ring. 2. No acute intracranial abnormality. 3. Small left frontal scalp hematoma without skull fracture. Electronically Signed   By: Ulyses Jarred M.D.   On: 03/08/2021 01:58   DG Femur Min 2 Views Right  Result Date: 03/08/2021 CLINICAL DATA:  Fall EXAM: RIGHT FEMUR 2 VIEWS COMPARISON:  None. FINDINGS: There is no evidence of fracture or other focal bone lesions. Soft tissues are unremarkable. Exostosis of the right iliac wing. IMPRESSION: Negative. Electronically Signed   By: Ulyses Jarred M.D.   On: 03/08/2021 01:32    Review Of Systems As per HPI   Blood pressure (!) 197/98, pulse (!) 58, temperature (!) 97.1 F (36.2 C), temperature source Axillary, resp. rate 20, SpO2 98 %. There is no height or weight on file to calculate BMI. General appearance: alert, cooperative, appears stated age and no distress Head: Normocephalic, atraumatic. Eyes: Blue eyes, pink conjunctiva, corneas clear.  Neck: No adenopathy, no carotid bruit, no JVD, supple, symmetrical, trachea midline and thyroid not enlarged. Resp: Clear to auscultation bilaterally. Cardio: Irregular rate and rhythm, S1, S2 normal, II/VI systolic murmur, no click, rub or gallop GI: Soft, non-tender; bowel sounds normal; no organomegaly. Extremities: No edema, cyanosis or clubbing. Skin: Warm and dry.  Neurologic: Alert and oriented X 1.  Assessment/Plan Sinus arrhythmia Recent fall with C1 fracture HTN Hypothyroidism CKD, IIIa  Plan: Agree with cardiac monitoring. Medical treatment. IV fluids if needed. Echocardiogram for LV function.  Time spent: Review of old records, Lab, x-rays, EKG, other cardiac tests, examination, discussion with patient/Nurse/Doctor over 70 minutes.  Birdie Riddle, MD  03/08/2021, 9:28 AM

## 2021-03-08 NOTE — ED Notes (Signed)
Patient transported to CT 

## 2021-03-08 NOTE — ED Provider Notes (Signed)
[facility] EMERGENCY DEPARTMENT Provider Note   CSN: [id number] Arrival date & time: 03/07/21  2341    History Chief Complaint  Patient presents with   Lytle Michaels    Alexandra Alexander is a 87 y.o. female with medical history significant for hypertension, hypothyroidism, CKD, recurrent UTI who presents for evaluation of possible fall.    Level 5 caveat> AMS    Patient from [facility].  Per EMS called out for patient being found on the floor hollering for help after staff hear a thump on the ground.  Staff states unwitnessed fall, unknown LOC, staff apparently stated patient was at her baseline however this is unclear as conflicting reports. She cannot tell me if she hurts anywhere however does seem to have some tenderness to right mid femur, Left BKA. Full ROM to BLE.  Per Son, patient normally conversational, ambulatory and has on her prosthetic leg with a walker.  Her mentation today is different than normal. Unknown last known normal, per son spoke with her 4 days ago and patient was "fine." According to him she has frequent UTIs and states her presentation today is similar to her typical UTIs.  Attempted to contact [facility] for collateral however unable to reach facility x2  HPI     Past Medical History:  Diagnosis Date   Cancer [facility]) breast   H/O mastectomy right   Hypertension    Hypothyroidism     Patient Active Problem List   Diagnosis Date Noted   Acute encephalopathy 03/08/2021   AKI (acute kidney injury) ([place]) 07/14/2017   Stage 3 chronic kidney disease ([place]) 07/14/2017   Acute pyelonephritis 07/14/2017   Left ureteral stone 07/14/2017   Elevated troponin 07/14/2017   Hypothyroidism 07/14/2017   Sepsis due to Escherichia coli (E. coli) ([place]) 07/14/2017   E coli bacteremia 07/14/2017   Sepsis ([place]) 07/13/2017   Hypertension 05/03/2017   Mild renal insufficiency 05/03/2017    Past Surgical History:  Procedure Laterality Date   ABDOMINAL  HYSTERECTOMY     BREAST SURGERY     CYSTOSCOPY WITH STENT PLACEMENT Left 07/13/2017   Procedure: CYSTOSCOPY, LEFT RETROGRADE, LEFT URETEROSCOPY WITH LEFT URETERAL STENT PLACEMENT;  Surgeon: Irine Seal, MD;  Location: [facility];  Service: Urology;  Laterality: Left;   CYSTOSCOPY/URETEROSCOPY/HOLMIUM LASER/STENT PLACEMENT Left 07/22/2017   Procedure: CYSTOSCOPY LEFT URETEROSCOPY  POSSIBLE /HOLMIUM LASER/STENT EXCHANGE;  Surgeon: Irine Seal, MD;  Location: [facility];  Service: Urology;  Laterality: Left;   LEG AMPUTATION       OB History   No obstetric history on file.     No family history on file.  Social History   Tobacco Use   Smoking status: Never   Smokeless tobacco: Never  Substance Use Topics   Alcohol use: No   Drug use: No    Home Medications Prior to Admission medications   Medication Sig Start Date End Date Taking? Authorizing Provider  acetaminophen (TYLENOL) 325 MG tablet Take 650 mg by mouth in the morning and at bedtime.   Yes [provider]  amLODipine (NORVASC) 5 MG tablet Take 5 mg by mouth daily. 02/26/21  Yes [provider]  fentaNYL (DURAGESIC) 25 MCG/HR Place 1 patch onto the skin every 3 (three) days. 02/20/21  Yes [provider]  HYDROcodone-acetaminophen (NORCO/VICODIN) 5-325 MG tablet Take 1 tablet by mouth daily as needed for moderate pain.   Yes [provider]  levothyroxine (SYNTHROID, LEVOTHROID) 75 MCG tablet Take 75 mcg by mouth daily  before breakfast.   Yes [provider]  losartan (COZAAR) 100 MG tablet Take 100 mg by mouth daily.   Yes [provider]  melatonin 3 MG TABS tablet Take 3 mg by mouth at bedtime as needed (sleep).   Yes [provider]  Multiple Vitamins-Minerals (CERTA-VITE PO) Take 1 tablet by mouth daily.   Yes [provider]  Multiple Vitamins-Minerals (ICAPS PO) Take 1 capsule by mouth daily.   Yes [provider]  nebivolol (BYSTOLIC) 10 MG tablet  Take 10 mg by mouth daily.   Yes [provider]  Omega-3 Fatty Acids (FISH OIL) 1000 MG CAPS Take 1,000 mg by mouth daily.   Yes [provider]  ondansetron (ZOFRAN) 4 MG tablet Take 4 mg by mouth every 8 (eight) hours as needed for nausea or vomiting.   Yes [provider]  polyethylene glycol (MIRALAX / GLYCOLAX) packet Take 17 g by mouth daily.   Yes [provider]  senna (SENOKOT) 8.6 MG tablet Take 1 tablet by mouth daily.   Yes [provider]  fentaNYL (DURAGESIC - DOSED MCG/HR) 50 MCG/HR Place 1 patch (50 mcg total) onto the skin every 3 (three) days. REMOVE OLD ONE PRIOR TO APPLYING NEW ONE Patient not taking: No sig reported 07/19/17   Janece Canterbury, MD  hydrALAZINE (APRESOLINE) 100 MG tablet Take 1 tablet (100 mg total) by mouth 3 (three) times daily. Patient not taking: Reported on 03/08/2021 07/19/17   Janece Canterbury, MD  MOLNUPIRAVIR PO Take 200 mg by mouth See admin instructions. Bid x 5 days Patient not taking: No sig reported    [provider]   Allergies    Codeine and Vancomycin  Review of Systems   Review of Systems  Unable to perform ROS: Mental status change  All other systems reviewed and are negative.  Physical Exam Updated Vital Signs BP (!) 197/98 (BP Location: Left Arm)   Pulse (!) 58   Temp (!) 97.1 F (36.2 C) (Axillary)   Resp 20   SpO2 98%   Physical Exam Vitals and nursing note reviewed.  Constitutional:      General: She is not in acute distress.    Appearance: She is well-developed. She is not ill-appearing, toxic-appearing or diaphoretic.  HENT:     Head: Normocephalic.     Comments: Small hematoma to left scalp    Mouth/Throat:     Mouth: Mucous membranes are dry.  Eyes:     Pupils: Pupils are equal, round, and reactive to light.  Cardiovascular:     Rate and Rhythm: Normal rate.     Pulses: Normal pulses.     Heart sounds: Normal heart sounds.  Pulmonary:     Effort: Pulmonary  effort is normal. No respiratory distress.     Breath sounds: Normal breath sounds.  Abdominal:     General: Bowel sounds are normal. There is no distension.     Palpations: Abdomen is soft.     Tenderness: There is no abdominal tenderness.  Musculoskeletal:        General: Normal range of motion.     Cervical back: Normal range of motion.     Comments: Left BKA. Mild tenderness to right femur. Lifts BUE over head without difficulty  Skin:    General: Skin is warm and dry.     Capillary Refill: Capillary refill takes less than 2 seconds.  Neurological:     Mental Status: She is alert.  Comments: Looks up to name, Will shake head to answer question occasionally. Intermittently follows commands, unable to perform further neuro exam  Psychiatric:        Mood and Affect: Mood normal.    ED Results / Procedures / Treatments   Labs (all labs ordered are listed, but only abnormal results are displayed) Labs Reviewed  CBC WITH DIFFERENTIAL/PLATELET - Abnormal; Notable for the following components:      Result Value   RBC 3.77 (*)    Hemoglobin 11.6 (*)    Platelets 138 (*)    Eosinophils Absolute 0.6 (*)    All other components within normal limits  COMPREHENSIVE METABOLIC PANEL - Abnormal; Notable for the following components:   Glucose, Bld 102 (*)    BUN 26 (*)    Creatinine, Ser 1.33 (*)    Total Protein 6.1 (*)    Albumin 3.4 (*)    GFR, Estimated 36 (*)    All other components within normal limits  URINALYSIS, ROUTINE W REFLEX MICROSCOPIC - Abnormal; Notable for the following components:   Leukocytes,Ua MODERATE (*)    Bacteria, UA RARE (*)    All other components within normal limits  RESP PANEL BY RT-PCR (FLU A&B, COVID) [id number]    EKG EKG Interpretation  Date/Time:  Thursday March 08 2021 03:39:29 EDT Ventricular Rate:  53 PR Interval:    QRS Duration: 130 QT Interval:  432 QTC Calculation: 405 R Axis:   28 Text Interpretation: Sinus rhythm Right bundle  branch block T wave abnormality, consider inferior ischemia Abnormal ECG Confirmed by Addison Lank [phone]) on 03/08/2021 4:38:38 AM  Radiology DG Chest 2 View  Result Date: 03/08/2021 CLINICAL DATA:  Fall EXAM: CHEST - 2 VIEW COMPARISON:  None. FINDINGS: The heart size and mediastinal contours are within normal limits. Both lungs are clear. The visualized skeletal structures are unremarkable. IMPRESSION: No active cardiopulmonary disease. Electronically Signed   By: Ulyses Jarred M.D.   On: 03/08/2021 01:36   DG Pelvis 1-2 Views  Result Date: 03/08/2021 CLINICAL DATA:  Fall, altered mental status EXAM: PELVIS - 1-2 VIEW COMPARISON:  None FINDINGS: The osseous structures are diffusely osteopenic. No acute fracture or dislocation. Moderate left hip degenerative arthritis. Ossification in the region of the anterior superior iliac spine on the right likely relates to remote trauma or inflammation involving the sartorius or tensor fascial lata. IMPRESSION: No acute fracture or dislocation Electronically Signed   By: Fidela Salisbury M.D.   On: 03/08/2021 01:27   CT HEAD WO CONTRAST (5MM)  Result Date: 03/08/2021 CLINICAL DATA:  Fall EXAM: CT HEAD WITHOUT CONTRAST CT CERVICAL SPINE WITHOUT CONTRAST TECHNIQUE: Multidetector CT imaging of the head and cervical spine was performed following the standard protocol without intravenous contrast. Multiplanar CT image reconstructions of the cervical spine were also generated. COMPARISON:  02/04/2018 FINDINGS: CT HEAD FINDINGS Brain: There is no mass, hemorrhage or extra-axial collection. There is generalized atrophy without lobar predilection. There is hypoattenuation of the periventricular white matter, most commonly indicating chronic ischemic microangiopathy. Vascular: No abnormal hyperdensity of the major intracranial arteries or dural venous sinuses. No intracranial atherosclerosis. Skull: Small left frontal scalp hematoma.  No skull fracture. Sinuses/Orbits: No  fluid levels or advanced mucosal thickening of the visualized paranasal sinuses. No mastoid or middle ear effusion. The orbits are normal. CT CERVICAL SPINE FINDINGS Alignment: No static subluxation. Facets are aligned. Occipital condyles are normally positioned. Skull base and vertebrae: There are minimally displaced fractures at the left and  right lateral aspects of the C1 ring are unchanged since 02/04/2018. No other cervical spine fracture. Soft tissues and spinal canal: No prevertebral fluid or swelling. No visible canal hematoma. Disc levels: No advanced spinal canal or neural foraminal stenosis. Upper chest: No pneumothorax, pulmonary nodule or pleural effusion. Other: Normal visualized paraspinal cervical soft tissues. IMPRESSION: 1. Minimally displaced fractures at the left and right lateral aspects of the C1 ring. 2. No acute intracranial abnormality. 3. Small left frontal scalp hematoma without skull fracture. Electronically Signed   By: Ulyses Jarred M.D.   On: 03/08/2021 01:58   CT Cervical Spine Wo Contrast  Result Date: 03/08/2021 CLINICAL DATA:  Fall EXAM: CT HEAD WITHOUT CONTRAST CT CERVICAL SPINE WITHOUT CONTRAST TECHNIQUE: Multidetector CT imaging of the head and cervical spine was performed following the standard protocol without intravenous contrast. Multiplanar CT image reconstructions of the cervical spine were also generated. COMPARISON:  02/04/2018 FINDINGS: CT HEAD FINDINGS Brain: There is no mass, hemorrhage or extra-axial collection. There is generalized atrophy without lobar predilection. There is hypoattenuation of the periventricular white matter, most commonly indicating chronic ischemic microangiopathy. Vascular: No abnormal hyperdensity of the major intracranial arteries or dural venous sinuses. No intracranial atherosclerosis. Skull: Small left frontal scalp hematoma.  No skull fracture. Sinuses/Orbits: No fluid levels or advanced mucosal thickening of the visualized paranasal  sinuses. No mastoid or middle ear effusion. The orbits are normal. CT CERVICAL SPINE FINDINGS Alignment: No static subluxation. Facets are aligned. Occipital condyles are normally positioned. Skull base and vertebrae: There are minimally displaced fractures at the left and right lateral aspects of the C1 ring are unchanged since 02/04/2018. No other cervical spine fracture. Soft tissues and spinal canal: No prevertebral fluid or swelling. No visible canal hematoma. Disc levels: No advanced spinal canal or neural foraminal stenosis. Upper chest: No pneumothorax, pulmonary nodule or pleural effusion. Other: Normal visualized paraspinal cervical soft tissues. IMPRESSION: 1. Minimally displaced fractures at the left and right lateral aspects of the C1 ring. 2. No acute intracranial abnormality. 3. Small left frontal scalp hematoma without skull fracture. Electronically Signed   By: Ulyses Jarred M.D.   On: 03/08/2021 01:58   DG Femur Min 2 Views Right  Result Date: 03/08/2021 CLINICAL DATA:  Fall EXAM: RIGHT FEMUR 2 VIEWS COMPARISON:  None. FINDINGS: There is no evidence of fracture or other focal bone lesions. Soft tissues are unremarkable. Exostosis of the right iliac wing. IMPRESSION: Negative. Electronically Signed   By: Ulyses Jarred M.D.   On: 03/08/2021 01:32    Procedures Procedures   Medications Ordered in ED Medications  cefTRIAXone (ROCEPHIN) 1 g in sodium chloride 0.9 % 100 mL IVPB (0 g Intravenous Stopped 03/08/21 0455)    ED Course  I have reviewed the triage vital signs and the nursing notes.  Pertinent labs & imaging results that were available during my care of the patient were reviewed by me and considered in my medical decision making (see chart for details).  Here for evaluation of fall.  On arrival patient afebrile, nonseptic appearing however does appear confused.  Per family she is normally alert, ambulatory and conversational.  Was fine up until yesterday.  Does have history of  frequent UTIs, son states presentation is similar.  Plan on labs, imaging and reassess  Labs and imaging personally reviewed and interpreted:  UA positive for infection, given Rocephin CBC without leukocytosis, hemoglobin 11.6, similar to prior Metabolic panel creatinine 1.33 CT head without any significant finding CT cervical with  possible fracture lateral aspect C1 ring, patient has equal strength bilaterally, intact sensation X-ray pelvis without acute findings X-ray femur without acute findings X-ray chest x-ray without acute finding EKG without ischemic change  Dr. Wyvonnia Dusky has spoken with neurosurgery ([place]), recommend [place] collar, follow-up outpatient  Patient placed in [place] collar here in ED.  Discussed labs and imaging with son.  He is agreeable for admission.  The patient appears reasonably stabilized for admission considering the current resources, flow, and capabilities available in the ED at this time, and I doubt any other [facility] requiring further screening and/or treatment in the ED prior to admission.   CONSULT with Dr. Hal Hope with [facility] who agrees to evaluate patient for admission.  Patient seen and evaluated by attending Dr. Leonette Monarch who agrees to evaluate patient for admission.  Clinical Course as of 03/08/21 0526  Thu Mar 08, 2021  0146 Son at bedside [BH]    Clinical Course User Index [BH] Tomeeka Plaugher A, PA-C   MDM Rules/Calculators/A&P                            Final Clinical Impression(s) / ED Diagnoses Final diagnoses:  Fall, initial encounter  Closed displaced fracture of first cervical vertebra, unspecified fracture morphology, initial encounter ([place])  Altered mental status, unspecified altered mental status type  Acute cystitis without hematuria    Rx / DC Orders ED Discharge Orders     None        Mj Willis A, PA-C 03/08/21 0526    Fatima Blank, MD 03/08/21 [id number]    Fatima Blank, MD 03/08/21  [phone]

## 2021-03-08 NOTE — ED Triage Notes (Signed)
Pt arrived via [facility] from [facility]. Per EMS, they were requested by [facility] staff after they found pt on the floor in her room hollering. Staff reports unwitnessed fall, unknown LOC but state pt has been at her baseline mental status since they found her on the floor. Conflicting reports of dementia hx between SNF personnel according to EMS. Pt caox1 with no obvious neuro deficit. EMS reports unable to place c-collar d/t pt not tolerating. Pt does not take blood thinner medication. Only obvious injury was hematoma to the top left head with no obvious bleeding.   EMS VS  BP 172/94 HR 56 RR 18 SpO2 98% CBG 144

## 2021-03-08 NOTE — ED Notes (Signed)
Patient was given a Cup of ice water.

## 2021-03-09 ENCOUNTER — Observation Stay (HOSPITAL_COMMUNITY): Payer: Medicare Other

## 2021-03-09 DIAGNOSIS — D696 Thrombocytopenia, unspecified: Secondary | ICD-10-CM | POA: Diagnosis present

## 2021-03-09 DIAGNOSIS — Z20822 Contact with and (suspected) exposure to covid-19: Secondary | ICD-10-CM | POA: Diagnosis present

## 2021-03-09 DIAGNOSIS — F0390 Unspecified dementia without behavioral disturbance: Secondary | ICD-10-CM | POA: Diagnosis present

## 2021-03-09 DIAGNOSIS — W19XXXA Unspecified fall, initial encounter: Secondary | ICD-10-CM | POA: Diagnosis present

## 2021-03-09 DIAGNOSIS — Z885 Allergy status to narcotic agent status: Secondary | ICD-10-CM | POA: Diagnosis not present

## 2021-03-09 DIAGNOSIS — S12000A Unspecified displaced fracture of first cervical vertebra, initial encounter for closed fracture: Secondary | ICD-10-CM | POA: Diagnosis present

## 2021-03-09 DIAGNOSIS — E039 Hypothyroidism, unspecified: Secondary | ICD-10-CM | POA: Diagnosis present

## 2021-03-09 DIAGNOSIS — Z9012 Acquired absence of left breast and nipple: Secondary | ICD-10-CM | POA: Diagnosis not present

## 2021-03-09 DIAGNOSIS — Z743 Need for continuous supervision: Secondary | ICD-10-CM | POA: Diagnosis not present

## 2021-03-09 DIAGNOSIS — Z7189 Other specified counseling: Secondary | ICD-10-CM | POA: Diagnosis not present

## 2021-03-09 DIAGNOSIS — Z66 Do not resuscitate: Secondary | ICD-10-CM | POA: Diagnosis present

## 2021-03-09 DIAGNOSIS — R296 Repeated falls: Secondary | ICD-10-CM | POA: Diagnosis present

## 2021-03-09 DIAGNOSIS — Z781 Physical restraint status: Secondary | ICD-10-CM | POA: Diagnosis not present

## 2021-03-09 DIAGNOSIS — Z79899 Other long term (current) drug therapy: Secondary | ICD-10-CM | POA: Diagnosis not present

## 2021-03-09 DIAGNOSIS — R29898 Other symptoms and signs involving the musculoskeletal system: Secondary | ICD-10-CM | POA: Diagnosis not present

## 2021-03-09 DIAGNOSIS — H9190 Unspecified hearing loss, unspecified ear: Secondary | ICD-10-CM | POA: Diagnosis present

## 2021-03-09 DIAGNOSIS — R5383 Other fatigue: Secondary | ICD-10-CM | POA: Diagnosis present

## 2021-03-09 DIAGNOSIS — N179 Acute kidney failure, unspecified: Secondary | ICD-10-CM | POA: Diagnosis present

## 2021-03-09 DIAGNOSIS — E440 Moderate protein-calorie malnutrition: Secondary | ICD-10-CM | POA: Diagnosis not present

## 2021-03-09 DIAGNOSIS — Z515 Encounter for palliative care: Secondary | ICD-10-CM | POA: Diagnosis not present

## 2021-03-09 DIAGNOSIS — S12000S Unspecified displaced fracture of first cervical vertebra, sequela: Secondary | ICD-10-CM | POA: Diagnosis not present

## 2021-03-09 DIAGNOSIS — Z79890 Hormone replacement therapy (postmenopausal): Secondary | ICD-10-CM | POA: Diagnosis not present

## 2021-03-09 DIAGNOSIS — Z888 Allergy status to other drugs, medicaments and biological substances status: Secondary | ICD-10-CM | POA: Diagnosis not present

## 2021-03-09 DIAGNOSIS — I160 Hypertensive urgency: Secondary | ICD-10-CM | POA: Diagnosis present

## 2021-03-09 DIAGNOSIS — R5381 Other malaise: Secondary | ICD-10-CM | POA: Diagnosis not present

## 2021-03-09 DIAGNOSIS — I10 Essential (primary) hypertension: Secondary | ICD-10-CM | POA: Diagnosis not present

## 2021-03-09 DIAGNOSIS — Y92129 Unspecified place in nursing home as the place of occurrence of the external cause: Secondary | ICD-10-CM | POA: Diagnosis not present

## 2021-03-09 DIAGNOSIS — U071 COVID-19: Secondary | ICD-10-CM | POA: Diagnosis not present

## 2021-03-09 DIAGNOSIS — G9341 Metabolic encephalopathy: Secondary | ICD-10-CM | POA: Diagnosis present

## 2021-03-09 DIAGNOSIS — N1832 Chronic kidney disease, stage 3b: Secondary | ICD-10-CM | POA: Diagnosis present

## 2021-03-09 DIAGNOSIS — I441 Atrioventricular block, second degree: Secondary | ICD-10-CM | POA: Diagnosis present

## 2021-03-09 DIAGNOSIS — D649 Anemia, unspecified: Secondary | ICD-10-CM | POA: Diagnosis present

## 2021-03-09 DIAGNOSIS — I129 Hypertensive chronic kidney disease with stage 1 through stage 4 chronic kidney disease, or unspecified chronic kidney disease: Secondary | ICD-10-CM | POA: Diagnosis present

## 2021-03-09 DIAGNOSIS — N390 Urinary tract infection, site not specified: Secondary | ICD-10-CM | POA: Diagnosis present

## 2021-03-09 DIAGNOSIS — G9340 Encephalopathy, unspecified: Secondary | ICD-10-CM | POA: Diagnosis not present

## 2021-03-09 LAB — COMPREHENSIVE METABOLIC PANEL
ALT: 15 U/L (ref 0–44)
AST: 24 U/L (ref 15–41)
Albumin: 3.4 g/dL — ABNORMAL LOW (ref 3.5–5.0)
Alkaline Phosphatase: 52 U/L (ref 38–126)
Anion gap: 7 (ref 5–15)
BUN: 18 mg/dL (ref 8–23)
CO2: 25 mmol/L (ref 22–32)
Calcium: 8.8 mg/dL — ABNORMAL LOW (ref 8.9–10.3)
Chloride: 108 mmol/L (ref 98–111)
Creatinine, Ser: 1.04 mg/dL — ABNORMAL HIGH (ref 0.44–1.00)
GFR, Estimated: 49 mL/min — ABNORMAL LOW (ref >60)
Glucose, Bld: 89 mg/dL (ref 70–99)
Potassium: 3.6 mmol/L (ref 3.5–5.1)
Sodium: 140 mmol/L (ref 135–145)
Total Bilirubin: 0.7 mg/dL (ref 0.3–1.2)
Total Protein: 5.8 g/dL — ABNORMAL LOW (ref 6.5–8.1)

## 2021-03-09 LAB — PHOSPHORUS: Phosphorus: 2.8 mg/dL (ref 2.5–4.6)

## 2021-03-09 LAB — CBC
HCT: 33.6 % — ABNORMAL LOW (ref 36.0–46.0)
Hemoglobin: 11.5 g/dL — ABNORMAL LOW (ref 12.0–15.0)
MCH: 31.3 pg (ref 26.0–34.0)
MCHC: 34.2 g/dL (ref 30.0–36.0)
MCV: 91.6 fL (ref 80.0–100.0)
Platelets: 136 10*3/uL — ABNORMAL LOW (ref 150–400)
RBC: 3.67 MIL/uL — ABNORMAL LOW (ref 3.87–5.11)
RDW: 12 % (ref 11.5–15.5)
WBC: 6.8 10*3/uL (ref 4.0–10.5)
nRBC: 0 % (ref 0.0–0.2)

## 2021-03-09 LAB — MAGNESIUM: Magnesium: 1.9 mg/dL (ref 1.7–2.4)

## 2021-03-09 MED ORDER — AMLODIPINE BESYLATE 10 MG PO TABS
10.0000 mg | ORAL_TABLET | Freq: Every day | ORAL | Status: DC
  Administered 2021-03-09 – 2021-03-16 (×7): 10 mg via ORAL
  Filled 2021-03-09: qty 2
  Filled 2021-03-09 (×4): qty 1
  Filled 2021-03-09 (×2): qty 2
  Filled 2021-03-09 (×3): qty 1

## 2021-03-09 MED ORDER — DIPHENHYDRAMINE HCL 50 MG/ML IJ SOLN
50.0000 mg | Freq: Once | INTRAMUSCULAR | Status: AC
  Administered 2021-03-09: 50 mg via INTRAVENOUS
  Filled 2021-03-09: qty 1

## 2021-03-09 MED ORDER — LORAZEPAM 2 MG/ML IJ SOLN
0.5000 mg | Freq: Once | INTRAMUSCULAR | Status: AC
  Administered 2021-03-09: 0.5 mg via INTRAVENOUS
  Filled 2021-03-09: qty 1

## 2021-03-09 NOTE — Plan of Care (Signed)
  Problem: Clinical Measurements: Goal: Ability to maintain clinical measurements within normal limits will improve Outcome: Progressing Goal: Will remain free from infection Outcome: Progressing Goal: Diagnostic test results will improve Outcome: Progressing Goal: Respiratory complications will improve Outcome: Progressing Goal: Cardiovascular complication will be avoided Outcome: Progressing   Problem: Activity: Goal: Risk for activity intolerance will decrease Outcome: Progressing   Problem: Elimination: Goal: Will not experience complications related to urinary retention Outcome: Progressing   Problem: Pain Managment: Goal: General experience of comfort will improve Outcome: Progressing   Problem: Safety: Goal: Ability to remain free from injury will improve Outcome: Progressing   Problem: Skin Integrity: Goal: Risk for impaired skin integrity will decrease Outcome: Progressing

## 2021-03-09 NOTE — Progress Notes (Signed)
Echo attempted. Patient care in progress. Will attempt again later as time permits.

## 2021-03-09 NOTE — Progress Notes (Signed)
PROGRESS NOTE    MILISSA FESPERMAN  AOZ:[id number] DOB: 17-Jun-1921 DOA: 03/07/2021 PCP: [name], MD   Brief Narrative:  This 87 years old female with PMH significant for hypertension, hypothyroidism, chronic kidney disease stage IIIb, anemia and thrombocytopenia presented to the ED s/p unwitnessed fall at the living facility.  In the ED She is found to be confused and CT head and C-spine and x-ray pelvis were completed.  CT C-spine shows C1 ring fracture.  ER physician has discussed with on-call neurosurgery, who recommended to place patient on cervical collar and follow-up outpatient with neurosurgery. Patient EKG is concerning for second-degree AV block.  Cardiology consulted , recommended to continue telemetry and repeat echocardiogram.  Patient has been doing fine.  Assessment & Plan:   Principal Problem:   Acute encephalopathy Active Problems:   Stage 3 chronic kidney disease (HCC)   Hypothyroidism   Hypertensive urgency   Bradycardia   Acute lower UTI   Anemia   Thrombocytopenia (HCC)  Acute encephalopathy: > Resolved This could be secondary to post concussion or UTI. CT head negative for any acute abnormality.  UA consistent with UTI. Continue ceftriaxone, follow-up urine cultures. Daughter at bedside,  reported whenever she gets UTIs she becomes confused.  Bradycardia :  Patient was found to have bradycardia with EKG shows second-degree AV block type I Cardiology consulted, EKG in the morning showed normal sinus rhythm with sinus arrhythmia. Cardiology recommended to hold Bystolic and obtain echocardiogram.  Hypertensive urgency: > Improving Continue IV hydralazine as needed Continue amlodipine and losartan  C1 cervical fracture: Neurosurgery recommended cervical collar and outpatient neurosurgery follow-up. Patient seems uncomfortable wearing c-collar, she has been refusing.  Hypothyroidism: Continue Synthroid  UTI:  Continue ceftriaxone follow-up urine  cultures  CKD stage IIIb: Serum creatinine appears at baseline.  Chronic anemia and thrombocytopenia: Seems stable.   DVT prophylaxis: Heparin Code Status: DNR Family Communication: Daughter at bedside Disposition Plan:   Status is: Inpatient  Remains inpatient appropriate because:Inpatient level of care appropriate due to severity of illness  Dispo: The patient is from: Home              Anticipated d/c is to: Home              Patient currently is not medically stable to d/c.   Difficult to place patient No  Consultants:  Cardiology  Procedures: Echocardiogram.  Antimicrobials: None.  Subjective: Patient was seen and examined at bedside.  Overnight events noted.   Patient was given Ativan last night because she was agitated.   Patient is now sleeping comfortably.  Daughter is at bedside and states she is better.  Objective: Vitals:   03/09/21 0045 03/09/21 0642 03/09/21 1040 03/09/21 1359  BP: (!) 165/104 (!) 192/157 (!) (P) 159/77 126/60  Pulse: 81 63 (P) 66 62  Resp: 16 16 (P) 18 17  Temp: 98.9 F (37.2 C) 97.7 F (36.5 C) (P) 98 F (36.7 C) 98.4 F (36.9 C)  TempSrc:  Axillary (P) Axillary Oral  SpO2: 98% 98% (P) 97% 96%   No intake or output data in the 24 hours ending 03/09/21 1516 There were no vitals filed for this visit.  Examination:  General exam: Appears comfortable, lying in the bed, not in any distress. Respiratory system: Clear to auscultation bilaterally, respiratory effort normal, RR 15 Cardiovascular system: S1-S2 heard, regular rate and rhythm, no murmur. Gastrointestinal system: Abdominal soft, nontender, nondistended, BS+ Central nervous system: Patient is sleeping, arousable.  No focal  neurological deficits. Extremities: No edema, no cyanosis, no clubbing. Skin: No rashes, lesions or ulcers Psychiatry: Judgement and insight appear normal. Mood & affect appropriate.     Data Reviewed: I have personally reviewed following labs and  imaging studies  CBC: Recent Labs  Lab 03/08/21 0353 03/09/21 0706  WBC 7.3 6.8  NEUTROABS 3.4  --   HGB 11.6* 11.5*  HCT 36.3 33.6*  MCV 96.3 91.6  PLT 138* 194*   Basic Metabolic Panel: Recent Labs  Lab 03/08/21 0353 03/09/21 0706  NA 142 140  K 4.3 3.6  CL 111 108  CO2 25 25  GLUCOSE 102* 89  BUN 26* 18  CREATININE 1.33* 1.04*  CALCIUM 9.1 8.8*  MG  --  1.9  PHOS  --  2.8   GFR: CrCl cannot be calculated (Unknown ideal weight.). Liver Function Tests: Recent Labs  Lab 03/08/21 0353 03/09/21 0706  AST 21 24  ALT 13 15  ALKPHOS 51 52  BILITOT 0.4 0.7  PROT 6.1* 5.8*  ALBUMIN 3.4* 3.4*   No results for input(s): LIPASE, AMYLASE in the last 168 hours. No results for input(s): AMMONIA in the last 168 hours. Coagulation Profile: No results for input(s): INR, PROTIME in the last 168 hours. Cardiac Enzymes: No results for input(s): CKTOTAL, CKMB, CKMBINDEX, TROPONINI in the last 168 hours. BNP (last 3 results) No results for input(s): PROBNP in the last 8760 hours. HbA1C: No results for input(s): HGBA1C in the last 72 hours. CBG: No results for input(s): GLUCAP in the last 168 hours. Lipid Profile: No results for input(s): CHOL, HDL, LDLCALC, TRIG, CHOLHDL, LDLDIRECT in the last 72 hours. Thyroid Function Tests: Recent Labs    03/08/21 0353  TSH 1.849   Anemia Panel: No results for input(s): VITAMINB12, FOLATE, FERRITIN, TIBC, IRON, RETICCTPCT in the last 72 hours. Sepsis Labs: No results for input(s): PROCALCITON, LATICACIDVEN in the last 168 hours.  Recent Results (from the past 240 hour(s))  Resp Panel by RT-PCR (Flu A&B, Covid) Nasopharyngeal Swab     Status: None   Collection Time: 03/08/21  1:40 AM   Specimen: Nasopharyngeal Swab; Nasopharyngeal(NP) swabs in vial transport medium  Result Value Ref Range Status   SARS Coronavirus 2 by RT PCR NEGATIVE NEGATIVE Final    Comment: (NOTE) SARS-CoV-2 target nucleic acids are NOT DETECTED.  The  SARS-CoV-2 RNA is generally detectable in upper respiratory specimens during the acute phase of infection. The lowest concentration of SARS-CoV-2 viral copies this assay can detect is 138 copies/mL. A negative result does not preclude SARS-Cov-2 infection and should not be used as the sole basis for treatment or other patient management decisions. A negative result may occur with  improper specimen collection/handling, submission of specimen other than nasopharyngeal swab, presence of viral mutation(s) within the areas targeted by this assay, and inadequate number of viral copies(<138 copies/mL). A negative result must be combined with clinical observations, patient history, and epidemiological information. The expected result is Negative.  Fact Sheet for Patients:  EntrepreneurPulse.com.au  Fact Sheet for Healthcare Providers:  IncredibleEmployment.be  This test is no t yet approved or cleared by the [place] FDA and  has been authorized for detection and/or diagnosis of SARS-CoV-2 by FDA under an Emergency Use Authorization (EUA). This EUA will remain  in effect (meaning this test can be used) for the duration of the COVID-19 declaration under Section 564(b)(1) of the Act, 21 U.S.C.section 360bbb-3(b)(1), unless the authorization is terminated  or revoked sooner.  Influenza A by PCR NEGATIVE NEGATIVE Final   Influenza B by PCR NEGATIVE NEGATIVE Final    Comment: (NOTE) The Xpert Xpress SARS-CoV-2/FLU/RSV plus assay is intended as an aid in the diagnosis of influenza from Nasopharyngeal swab specimens and should not be used as a sole basis for treatment. Nasal washings and aspirates are unacceptable for Xpert Xpress SARS-CoV-2/FLU/RSV testing.  Fact Sheet for Patients: EntrepreneurPulse.com.au  Fact Sheet for Healthcare Providers: IncredibleEmployment.be  This test is not yet approved or  cleared by the [place] FDA and has been authorized for detection and/or diagnosis of SARS-CoV-2 by FDA under an Emergency Use Authorization (EUA). This EUA will remain in effect (meaning this test can be used) for the duration of the COVID-19 declaration under Section 564(b)(1) of the Act, 21 U.S.C. section 360bbb-3(b)(1), unless the authorization is terminated or revoked.  Performed at [facility], [place] [address], [place], [place] [address]     Radiology Studies: DG Chest 2 View  Result Date: 03/08/2021 CLINICAL DATA:  Fall EXAM: CHEST - 2 VIEW COMPARISON:  None. FINDINGS: The heart size and mediastinal contours are within normal limits. Both lungs are clear. The visualized skeletal structures are unremarkable. IMPRESSION: No active cardiopulmonary disease. Electronically Signed   By: Ulyses Jarred M.D.   On: 03/08/2021 01:36   DG Pelvis 1-2 Views  Result Date: 03/08/2021 CLINICAL DATA:  Fall, altered mental status EXAM: PELVIS - 1-2 VIEW COMPARISON:  None FINDINGS: The osseous structures are diffusely osteopenic. No acute fracture or dislocation. Moderate left hip degenerative arthritis. Ossification in the region of the anterior superior iliac spine on the right likely relates to remote trauma or inflammation involving the sartorius or tensor fascial lata. IMPRESSION: No acute fracture or dislocation Electronically Signed   By: Fidela Salisbury M.D.   On: 03/08/2021 01:27   CT HEAD WO CONTRAST (5MM)  Result Date: 03/08/2021 CLINICAL DATA:  Fall EXAM: CT HEAD WITHOUT CONTRAST CT CERVICAL SPINE WITHOUT CONTRAST TECHNIQUE: Multidetector CT imaging of the head and cervical spine was performed following the standard protocol without intravenous contrast. Multiplanar CT image reconstructions of the cervical spine were also generated. COMPARISON:  02/04/2018 FINDINGS: CT HEAD FINDINGS Brain: There is no mass, hemorrhage or extra-axial collection. There is generalized atrophy without  lobar predilection. There is hypoattenuation of the periventricular white matter, most commonly indicating chronic ischemic microangiopathy. Vascular: No abnormal hyperdensity of the major intracranial arteries or dural venous sinuses. No intracranial atherosclerosis. Skull: Small left frontal scalp hematoma.  No skull fracture. Sinuses/Orbits: No fluid levels or advanced mucosal thickening of the visualized paranasal sinuses. No mastoid or middle ear effusion. The orbits are normal. CT CERVICAL SPINE FINDINGS Alignment: No static subluxation. Facets are aligned. Occipital condyles are normally positioned. Skull base and vertebrae: There are minimally displaced fractures at the left and right lateral aspects of the C1 ring are unchanged since 02/04/2018. No other cervical spine fracture. Soft tissues and spinal canal: No prevertebral fluid or swelling. No visible canal hematoma. Disc levels: No advanced spinal canal or neural foraminal stenosis. Upper chest: No pneumothorax, pulmonary nodule or pleural effusion. Other: Normal visualized paraspinal cervical soft tissues. IMPRESSION: 1. Minimally displaced fractures at the left and right lateral aspects of the C1 ring. 2. No acute intracranial abnormality. 3. Small left frontal scalp hematoma without skull fracture. Electronically Signed   By: Ulyses Jarred M.D.   On: 03/08/2021 01:58   CT Cervical Spine Wo Contrast  Result Date: 03/08/2021 CLINICAL DATA:  Fall  EXAM: CT HEAD WITHOUT CONTRAST CT CERVICAL SPINE WITHOUT CONTRAST TECHNIQUE: Multidetector CT imaging of the head and cervical spine was performed following the standard protocol without intravenous contrast. Multiplanar CT image reconstructions of the cervical spine were also generated. COMPARISON:  02/04/2018 FINDINGS: CT HEAD FINDINGS Brain: There is no mass, hemorrhage or extra-axial collection. There is generalized atrophy without lobar predilection. There is hypoattenuation of the periventricular  white matter, most commonly indicating chronic ischemic microangiopathy. Vascular: No abnormal hyperdensity of the major intracranial arteries or dural venous sinuses. No intracranial atherosclerosis. Skull: Small left frontal scalp hematoma.  No skull fracture. Sinuses/Orbits: No fluid levels or advanced mucosal thickening of the visualized paranasal sinuses. No mastoid or middle ear effusion. The orbits are normal. CT CERVICAL SPINE FINDINGS Alignment: No static subluxation. Facets are aligned. Occipital condyles are normally positioned. Skull base and vertebrae: There are minimally displaced fractures at the left and right lateral aspects of the C1 ring are unchanged since 02/04/2018. No other cervical spine fracture. Soft tissues and spinal canal: No prevertebral fluid or swelling. No visible canal hematoma. Disc levels: No advanced spinal canal or neural foraminal stenosis. Upper chest: No pneumothorax, pulmonary nodule or pleural effusion. Other: Normal visualized paraspinal cervical soft tissues. IMPRESSION: 1. Minimally displaced fractures at the left and right lateral aspects of the C1 ring. 2. No acute intracranial abnormality. 3. Small left frontal scalp hematoma without skull fracture. Electronically Signed   By: Ulyses Jarred M.D.   On: 03/08/2021 01:58   DG Femur Min 2 Views Right  Result Date: 03/08/2021 CLINICAL DATA:  Fall EXAM: RIGHT FEMUR 2 VIEWS COMPARISON:  None. FINDINGS: There is no evidence of fracture or other focal bone lesions. Soft tissues are unremarkable. Exostosis of the right iliac wing. IMPRESSION: Negative. Electronically Signed   By: Ulyses Jarred M.D.   On: 03/08/2021 01:32    Scheduled Meds:  amLODipine  10 mg Oral Daily   fentaNYL  1 patch Transdermal Q3 days   heparin  5,000 Units Subcutaneous Q8H   levothyroxine  75 mcg Oral QAC breakfast   losartan  100 mg Oral Daily   omega-3 acid ethyl esters  1 g Oral Daily   polyethylene glycol  17 g Oral Daily   senna  8.6  mg Oral Daily   Continuous Infusions:   LOS: 0 days    Time spent: 35 mins    Jamariya Davidoff, MD [facility]   If 7PM-7AM, please contact night-coverage

## 2021-03-10 ENCOUNTER — Inpatient Hospital Stay (HOSPITAL_COMMUNITY): Payer: Medicare Other

## 2021-03-10 DIAGNOSIS — G9340 Encephalopathy, unspecified: Secondary | ICD-10-CM | POA: Diagnosis not present

## 2021-03-10 LAB — ECHOCARDIOGRAM COMPLETE
AR max vel: 1.95 cm2
AV Area VTI: 2.01 cm2
AV Area mean vel: 1.78 cm2
AV Mean grad: 2 mmHg
AV Peak grad: 3.4 mmHg
Ao pk vel: 0.92 m/s
Area-P 1/2: 2.03 cm2
S' Lateral: 1.7 cm

## 2021-03-10 LAB — BASIC METABOLIC PANEL WITH GFR
Anion gap: 7 (ref 5–15)
BUN: 20 mg/dL (ref 8–23)
CO2: 24 mmol/L (ref 22–32)
Calcium: 8.7 mg/dL — ABNORMAL LOW (ref 8.9–10.3)
Chloride: 108 mmol/L (ref 98–111)
Creatinine, Ser: 1.28 mg/dL — ABNORMAL HIGH (ref 0.44–1.00)
GFR, Estimated: 38 mL/min — ABNORMAL LOW
Glucose, Bld: 83 mg/dL (ref 70–99)
Potassium: 3.9 mmol/L (ref 3.5–5.1)
Sodium: 139 mmol/L (ref 135–145)

## 2021-03-10 MED ORDER — SODIUM CHLORIDE 0.9 % IV SOLN
1.0000 g | INTRAVENOUS | Status: AC
  Administered 2021-03-10 – 2021-03-12 (×3): 1 g via INTRAVENOUS
  Filled 2021-03-10 (×3): qty 10

## 2021-03-10 NOTE — Evaluation (Signed)
Occupational Therapy Evaluation Patient Details Name: Alexandra Alexander MRN: [id number] DOB: Feb 11, 1922 Today's Date: 03/10/2021   History of Present Illness 87 years old female with PMH significant for hypertension, hypothyroidism, chronic kidney disease stage IIIb, anemia and thrombocytopenia presented to the ED s/p unwitnessed fall at the living facility.  In the ED She is found to be confused and CT head and C-spine and x-ray pelvis were completed.  CT C-spine shows C1 ring fracture.   Clinical Impression   Patient admitted for the diagnosis above.  Daughter in room and reports the patient remains lethargic, but is improving since last night.  PTA she lives at an ALF, walks with a 4WRW 300' plus each day to the dinning room.  She does require minimal assist for ADL completion for thoroughness, but toilets herself without assist.  Deficits impacting function are listed below.  Currently, she is very lethargic and resisting OOB attempt this morning.  OT to follow in the acute setting and more accurately assess ADL and toileting abilities, but family is hoping for a return to her ALF with [facility] OT if needed.  L prothesis is in the room.       Recommendations for follow up therapy are one component of a multi-disciplinary discharge planning process, led by the attending physician.  Recommendations may be updated based on patient status, additional functional criteria and insurance authorization.   Follow Up Recommendations  Home health OT    Equipment Recommendations  None recommended by OT    Recommendations for Other Services       Precautions / Restrictions Precautions Precautions: Fall;Cervical Precaution Booklet Issued: No Precaution Comments: patient is refusing collar Required Braces or Orthoses: Cervical Brace Restrictions Weight Bearing Restrictions: No      Mobility Bed Mobility Overal bed mobility: Needs Assistance Bed Mobility: Sidelying to Sit;Sit to Sidelying    Sidelying to sit: Max assist     Sit to sidelying: Max assist General bed mobility comments: patient initially trying to help, but actively resisting, so given C spine fracture, OOB abandoned. Patient Response: Restless  Transfers                 General transfer comment: NT    Balance Overall balance assessment: Needs assistance Sitting-balance support: Feet supported Sitting balance-Leahy Scale: Poor   Postural control: Right lateral lean;Posterior lean                                 ADL either performed or assessed with clinical judgement   ADL   Eating/Feeding: Moderate assistance;Bed level   Grooming: Wash/dry hands;Wash/dry face;Moderate assistance;Bed level                                 General ADL Comments: Attempted mobility to transfer to recliner for am meal to increase LOA     Vision Baseline Vision/History: 1 Wears glasses Patient Visual Report: No change from baseline                  Pertinent Vitals/Pain Pain Assessment: Faces Faces Pain Scale: Hurts little more Pain Location: generalized, patient not specifing any location due to lethargy. Pain Descriptors / Indicators: Grimacing Pain Intervention(s): Monitored during session     Hand Dominance Right   Extremity/Trunk Assessment Upper Extremity Assessment Upper Extremity Assessment: Overall WFL for tasks assessed   Lower Extremity Assessment  Lower Extremity Assessment: Defer to PT evaluation   Cervical / Trunk Assessment Cervical / Trunk Assessment: Kyphotic   Communication Communication Communication: HOH   Cognition Arousal/Alertness: Lethargic;Suspect due to medications Behavior During Therapy: Restless Overall Cognitive Status: Impaired/Different from baseline Area of Impairment: Awareness                           Awareness: Emergent   General Comments: very lethargic, recognizes family, but not verbalizing much                     Home Living Family/patient expects to be discharged to:: Assisted living                             Home Equipment: Walker - 4 wheels          Prior Functioning/Environment Level of Independence: Needs assistance  Gait / Transfers Assistance Needed: Walked in room and halls with [phone] and no assist.  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            OT Problem List: Decreased activity tolerance;Impaired balance (sitting and/or standing);Decreased knowledge of use of DME or AE;Decreased safety awareness;Decreased cognition      OT Treatment/Interventions: Self-care/ADL training;Therapeutic exercise;Therapeutic activities;Cognitive remediation/compensation;DME and/or AE instruction;Balance training;Patient/family education    OT Goals(Current goals can be found in the care plan section) Acute Rehab OT Goals Patient Stated Goal: Per family: return to ALF and undergo HH if needed OT Goal Formulation: Patient unable to participate in goal setting Time For Goal Achievement: 03/24/21 Potential to Achieve Goals: Fair ADL Goals Pt Will Perform Grooming: with set-up;sitting;standing Pt Will Perform Lower Body Bathing: with set-up;sit to/from stand Pt Will Perform Lower Body Dressing: with set-up;sit to/from stand Pt Will Transfer to Toilet: with supervision;ambulating;regular height toilet  OT Frequency: Min 2X/week   Barriers to D/C:    None noted       Co-evaluation              AM-PAC OT "6 Clicks" Daily Activity     Outcome Measure Help from another person eating meals?: A Lot Help from another person taking care of personal grooming?: A Lot Help from another person toileting, which includes using toliet, bedpan, or urinal?: Total Help from another person bathing (including washing, rinsing, drying)?: Total Help from another person to put on  and taking off regular upper body clothing?: Total Help from another person to put on and taking off regular lower body clothing?: Total 6 Click Score: 8   End of Session Nurse Communication: Mobility status  Activity Tolerance: Patient limited by lethargy Patient left: in bed;with call bell/phone within reach;with nursing/sitter in room;with family/visitor present  OT Visit Diagnosis: Unsteadiness on feet (R26.81);History of falling (Z91.81);Other symptoms and signs involving cognitive function;Pain                Time: 3403-5248 OT Time Calculation (min): 19 min Charges:  OT General Charges $OT Visit: 1 Visit OT Evaluation $OT Eval Moderate Complexity: 1 Mod  03/10/2021  RP, OTR/L  Acute Rehabilitation Services  Office:  [place] 03/10/2021, 9:57 AM

## 2021-03-10 NOTE — Evaluation (Signed)
Physical Therapy Evaluation Patient Details Name: Alexandra Alexander MRN: [id number] DOB: [birth year] Today's Date: 03/10/2021  History of Present Illness  Pt is a 87 y/o female who presented to the ED s/p unwitnessed fall at her assisted living facility. In the ED She is found to be confused and CT head and C-spine and x-ray pelvis were completed.  CT C-spine shows C1 ring fracture. PMH significant for hypertension, hypothyroidism, chronic kidney disease stage IIIb, prior R BKA (prosthesis in room).  Clinical Impression  Pt admitted with above diagnosis. Pt currently with functional limitations due to the deficits listed below (see PT Problem List). RN reports pt has been OOB to [facility] and recliner today, so progressed to EOB, however pt wanting to lay back down pretty quickly. As pt is declining the cervical collar, PT was conservative with mobility this session. +2 assist required mainly due to decreased desire to move. Son present and reports pt is typically very independent with her prosthesis and rollator. Pt will benefit from skilled PT to increase their independence and safety with mobility to allow discharge to the venue listed below.          Recommendations for follow up therapy are one component of a multi-disciplinary discharge planning process, led by the attending physician.  Recommendations may be updated based on patient status, additional functional criteria and insurance authorization.  Follow Up Recommendations Home health PT;Supervision/Assistance - 24 hour    Equipment Recommendations  None recommended by PT    Recommendations for Other Services       Precautions / Restrictions Precautions Precautions: Fall;Cervical Precaution Booklet Issued: No Precaution Comments: patient is refusing collar; R BKA prosthesis in room Required Braces or Orthoses: Cervical Brace Restrictions Weight Bearing Restrictions: No      Mobility  Bed Mobility Overal bed mobility: Needs  Assistance Bed Mobility: Sidelying to Sit;Sit to Sidelying   Sidelying to sit: Max assist;+2 for physical assistance;HOB elevated     Sit to sidelying: Modified independent (Device/Increase time) General bed mobility comments: Pt initially not assisting at all with mobility, however once sitting up more alert and able to improve posture. Pt pats the bed and states "lay down" and immediately returned to sidelying at a mod I level.    Transfers                 General transfer comment: Not able to progress to OOB mobility at this time.  Ambulation/Gait                Stairs            Wheelchair Mobility    Modified Rankin (Stroke Patients Only)       Balance Overall balance assessment: Needs assistance Sitting-balance support: Feet supported Sitting balance-Leahy Scale: Poor   Postural control: Right lateral lean;Posterior lean                                   Pertinent Vitals/Pain Pain Assessment: Faces Faces Pain Scale: No hurt    Home Living Family/patient expects to be discharged to:: Assisted living               Home Equipment: Walker - 4 wheels      Prior Function Level of Independence: Needs assistance   Gait / Transfers Assistance Needed: Walked in room and halls with rollator and no assist.  L prothesis is in room - old L BKA  ADL's / Homemaking Assistance Needed: Facility and daughter assists with bathing 3x/wk and dressing daily.        Hand Dominance   Dominant Hand: Right    Extremity/Trunk Assessment   Upper Extremity Assessment Upper Extremity Assessment: Defer to OT evaluation    Lower Extremity Assessment Lower Extremity Assessment: Generalized weakness;RLE deficits/detail RLE Deficits / Details: Prior BKA    Cervical / Trunk Assessment Cervical / Trunk Assessment: Kyphotic  Communication   Communication: HOH  Cognition Arousal/Alertness: Lethargic Behavior During Therapy: Flat  affect Overall Cognitive Status: Difficult to assess                                        General Comments      Exercises     Assessment/Plan    PT Assessment Patient needs continued PT services  PT Problem List Decreased strength;Decreased activity tolerance;Decreased balance;Decreased mobility;Decreased knowledge of use of DME;Decreased safety awareness;Decreased knowledge of precautions;Pain       PT Treatment Interventions DME instruction;Gait training;Functional mobility training;Therapeutic activities;Therapeutic exercise;Neuromuscular re-education;Patient/family education;Cognitive remediation    PT Goals (Current goals can be found in the Care Plan section)  Acute Rehab PT Goals Patient Stated Goal: Return to ALF PT Goal Formulation: With patient/family Time For Goal Achievement: 03/24/21 Potential to Achieve Goals: Good    Frequency Min 3X/week   Barriers to discharge        Co-evaluation               AM-PAC PT "6 Clicks" Mobility  Outcome Measure Help needed turning from your back to your side while in a flat bed without using bedrails?: None Help needed moving from lying on your back to sitting on the side of a flat bed without using bedrails?: None Help needed moving to and from a bed to a chair (including a wheelchair)?: A Little Help needed standing up from a chair using your arms (e.g., wheelchair or bedside chair)?: A Little Help needed to walk in hospital room?: A Little Help needed climbing 3-5 steps with a railing? : A Lot 6 Click Score: 19    End of Session   Activity Tolerance: Patient limited by lethargy Patient left: in bed;with call bell/phone within reach;with bed alarm set;with nursing/sitter in room;with family/visitor present Nurse Communication: Mobility status PT Visit Diagnosis: Unsteadiness on feet (R26.81);Difficulty in walking, not elsewhere classified (R26.2)    Time: 1594-5859 PT Time Calculation (min)  (ACUTE ONLY): 14 min   Charges:   PT Evaluation $PT Eval Moderate Complexity: 1 Mod          Rolinda Roan, PT, DPT Acute Rehabilitation Services Pager: [phone] Office: [phone]   Thelma Comp 03/10/2021, 3:59 PM

## 2021-03-10 NOTE — Progress Notes (Addendum)
PROGRESS NOTE    Alexandra Alexander  OIB:[id number] DOB: 03/19/1922 DOA: 03/07/2021 PCP: [name], MD   Brief Narrative:  This 87 years old female with PMH significant for hypertension, hypothyroidism, chronic kidney disease stage IIIb, anemia and thrombocytopenia presented to the ED s/p unwitnessed fall at the living facility.  In the ED She is found to be confused and CT head and C-spine and x-ray pelvis were completed.  CT C-spine shows C1 ring fracture.  ER physician has discussed with on-call neurosurgery, who recommended to place patient on cervical collar and follow-up outpatient with neurosurgery. Patient EKG is concerning for second-degree AV block.  Cardiology consulted , recommended to continue telemetry and repeat echocardiogram.  Patient has been doing fine.  Assessment & Plan:   Principal Problem:   Acute encephalopathy Active Problems:   Stage 3 chronic kidney disease (HCC)   Hypothyroidism   Hypertensive urgency   Bradycardia   Acute lower UTI   Anemia   Thrombocytopenia (HCC)  Acute encephalopathy: > Improving. This could be secondary to post concussion or UTI. CT head negative for any acute abnormality.  UA consistent with UTI. Continue ceftriaxone, urine cultures were not sent.  Continue ceftriaxone for 3 days. Daughter at bedside,  reported whenever she gets UTIs she becomes confused. Patient is alert, oriented x1 seems at her baseline.  Bradycardia :  Patient was found to have bradycardia with EKG showed second-degree AV block type I Cardiology consulted, EKG in the morning showed normal sinus rhythm with sinus arrhythmia. Cardiology recommended to hold Bystolic and obtain echocardiogram. Echocardiogram report pending.  Heart rate remains normal.  Hypertensive urgency: > Improved Continue amlodipine and losartan. Continue IV hydralazine as needed.  C1 cervical fracture: Neurosurgery recommended cervical collar and outpatient neurosurgery  follow-up. Patient reports feeling uncomfortable wearing c-collar, she has been refusing.  Hypothyroidism: Continue Synthroid  UTI:  Continue ceftriaxone, urine cultures were not sent. Continue ceftriaxone for 3 days.  CKD stage IIIb: Serum creatinine appears at baseline.  Chronic anemia and thrombocytopenia: Seems stable.   DVT prophylaxis: Heparin Code Status: DNR Family Communication: Daughter at bedside Disposition Plan:   Status is: Inpatient  Remains inpatient appropriate because:Inpatient level of care appropriate due to severity of illness  Dispo: The patient is from: Home              Anticipated d/c is to: Home              Patient currently is not medically stable to d/c.   Difficult to place patient No  Consultants:  Cardiology  Procedures: Echocardiogram.  Antimicrobials: None.  Subjective: Patient was seen and examined at bedside.  Overnight events noted.   Patient is alert and oriented x 1,  seems at her baseline. Overnight she was placed on one-to-one for safety.  Objective: Vitals:   03/09/21 1916 03/10/21 0010 03/10/21 0405 03/10/21 0849  BP: 119/70 (!) 156/88 131/74 114/79  Pulse: 61 68 (!) 59 65  Resp: 15 18 16 18   Temp: 98.6 F (37 C) 97.6 F (36.4 C) 98.2 F (36.8 C) 98.2 F (36.8 C)  TempSrc: Axillary Axillary Axillary Oral  SpO2: 99% 99% 95% 97%    Intake/Output Summary (Last 24 hours) at 03/10/2021 1017 Last data filed at 03/09/2021 1830 Gross per 24 hour  Intake 0 ml  Output 250 ml  Net -250 ml   There were no vitals filed for this visit.  Examination:  General exam: Appears comfortable, not in any acute distress, deconditioned.  Respiratory system: Clear to auscultation bilaterally, RR 15 Cardiovascular system: S1-S2 heard, regular rate and rhythm, no murmur Gastrointestinal system: Abdomen is soft, nontender, nondistended, BS +. Central nervous system: Alert and oriented x1   No focal neurological deficits. Extremities:  No edema, no cyanosis, no clubbing. Skin: No rashes, lesions or ulcers Psychiatry: Mood & affect appropriate.     Data Reviewed: I have personally reviewed following labs and imaging studies  CBC: Recent Labs  Lab 03/08/21 0353 03/09/21 0706  WBC 7.3 6.8  NEUTROABS 3.4  --   HGB 11.6* 11.5*  HCT 36.3 33.6*  MCV 96.3 91.6  PLT 138* 258*   Basic Metabolic Panel: Recent Labs  Lab 03/08/21 0353 03/09/21 0706 03/10/21 0140  NA 142 140 139  K 4.3 3.6 3.9  CL 111 108 108  CO2 25 25 24   GLUCOSE 102* 89 83  BUN 26* 18 20  CREATININE 1.33* 1.04* 1.28*  CALCIUM 9.1 8.8* 8.7*  MG  --  1.9  --   PHOS  --  2.8  --    GFR: CrCl cannot be calculated (Unknown ideal weight.). Liver Function Tests: Recent Labs  Lab 03/08/21 0353 03/09/21 0706  AST 21 24  ALT 13 15  ALKPHOS 51 52  BILITOT 0.4 0.7  PROT 6.1* 5.8*  ALBUMIN 3.4* 3.4*   No results for input(s): LIPASE, AMYLASE in the last 168 hours. No results for input(s): AMMONIA in the last 168 hours. Coagulation Profile: No results for input(s): INR, PROTIME in the last 168 hours. Cardiac Enzymes: No results for input(s): CKTOTAL, CKMB, CKMBINDEX, TROPONINI in the last 168 hours. BNP (last 3 results) No results for input(s): PROBNP in the last 8760 hours. HbA1C: No results for input(s): HGBA1C in the last 72 hours. CBG: No results for input(s): GLUCAP in the last 168 hours. Lipid Profile: No results for input(s): CHOL, HDL, LDLCALC, TRIG, CHOLHDL, LDLDIRECT in the last 72 hours. Thyroid Function Tests: Recent Labs    03/08/21 0353  TSH 1.849   Anemia Panel: No results for input(s): VITAMINB12, FOLATE, FERRITIN, TIBC, IRON, RETICCTPCT in the last 72 hours. Sepsis Labs: No results for input(s): PROCALCITON, LATICACIDVEN in the last 168 hours.  Recent Results (from the past 240 hour(s))  Resp Panel by RT-PCR (Flu A&B, Covid) Nasopharyngeal Swab     Status: None   Collection Time: 03/08/21  1:40 AM   Specimen:  Nasopharyngeal Swab; Nasopharyngeal(NP) swabs in vial transport medium  Result Value Ref Range Status   SARS Coronavirus 2 by RT PCR NEGATIVE NEGATIVE Final    Comment: (NOTE) SARS-CoV-2 target nucleic acids are NOT DETECTED.  The SARS-CoV-2 RNA is generally detectable in upper respiratory specimens during the acute phase of infection. The lowest concentration of SARS-CoV-2 viral copies this assay can detect is 138 copies/mL. A negative result does not preclude SARS-Cov-2 infection and should not be used as the sole basis for treatment or other patient management decisions. A negative result may occur with  improper specimen collection/handling, submission of specimen other than nasopharyngeal swab, presence of viral mutation(s) within the areas targeted by this assay, and inadequate number of viral copies(<138 copies/mL). A negative result must be combined with clinical observations, patient history, and epidemiological information. The expected result is Negative.  Fact Sheet for Patients:  EntrepreneurPulse.com.au  Fact Sheet for Healthcare Providers:  IncredibleEmployment.be  This test is no t yet approved or cleared by the [place] FDA and  has been authorized for detection and/or diagnosis of SARS-CoV-2  by FDA under an Emergency Use Authorization (EUA). This EUA will remain  in effect (meaning this test can be used) for the duration of the COVID-19 declaration under Section 564(b)(1) of the Act, 21 U.S.C.section 360bbb-3(b)(1), unless the authorization is terminated  or revoked sooner.       Influenza A by PCR NEGATIVE NEGATIVE Final   Influenza B by PCR NEGATIVE NEGATIVE Final    Comment: (NOTE) The Xpert Xpress SARS-CoV-2/FLU/RSV plus assay is intended as an aid in the diagnosis of influenza from Nasopharyngeal swab specimens and should not be used as a sole basis for treatment. Nasal washings and aspirates are unacceptable for  Xpert Xpress SARS-CoV-2/FLU/RSV testing.  Fact Sheet for Patients: EntrepreneurPulse.com.au  Fact Sheet for Healthcare Providers: IncredibleEmployment.be  This test is not yet approved or cleared by the [place] FDA and has been authorized for detection and/or diagnosis of SARS-CoV-2 by FDA under an Emergency Use Authorization (EUA). This EUA will remain in effect (meaning this test can be used) for the duration of the COVID-19 declaration under Section 564(b)(1) of the Act, 21 U.S.C. section 360bbb-3(b)(1), unless the authorization is terminated or revoked.  Performed at [facility], [place] [address] [place], [place] [address]     Radiology Studies: No results found.  Scheduled Meds:  amLODipine  10 mg Oral Daily   fentaNYL  1 patch Transdermal Q3 days   heparin  5,000 Units Subcutaneous Q8H   levothyroxine  75 mcg Oral QAC breakfast   losartan  100 mg Oral Daily   omega-3 acid ethyl esters  1 g Oral Daily   polyethylene glycol  17 g Oral Daily   senna  8.6 mg Oral Daily   Continuous Infusions:  cefTRIAXone (ROCEPHIN)  IV       LOS: 1 day    Time spent: 25 mins  Shawna Clamp, MD [facility]   If 7PM-7AM, please contact night-coverage

## 2021-03-10 NOTE — Progress Notes (Signed)
  Echocardiogram 2D Echocardiogram has been performed.  Alexandra Alexander 03/10/2021, 10:35 AM

## 2021-03-11 DIAGNOSIS — G9340 Encephalopathy, unspecified: Secondary | ICD-10-CM | POA: Diagnosis not present

## 2021-03-11 NOTE — Progress Notes (Signed)
Went to check on pt because she was yelling out. Sitter attempting to calm pt down and wake her up thinking pt was having a nightmare. Pt kicking and hitting at staff. Threw all the linens and pillows at the staff. Would redirect for a few seconds and then go back to striking out and attempting to get out of bed. I asked pt if she was in pain and she stated "yes." I asked if she wanted a pain pill and she stated "yes." I went and got a Norco, crushed it, and put it in pudding. Pt grabbed the container so I thought she was going to feed it to herself instead she took the container and threw it across the rm hitting the wall. Pt then laid down. Will continue to monitor.

## 2021-03-11 NOTE — Plan of Care (Signed)
Pt was more awake and alert tonight. Had an episode during the night where she was agitated striking out at staff but after about 10-15 min she calmed down. Not sure if she had a nightmare or what. Up to [facility] with mod assist of 1.

## 2021-03-11 NOTE — Progress Notes (Signed)
PROGRESS NOTE    Alexandra Alexander  BSJ:[id number] DOB: [birth year] DOA: 03/07/2021 PCP: [name], MD   Brief Narrative:  This 87 years old female with PMH significant for hypertension, hypothyroidism, chronic kidney disease stage IIIb, anemia and thrombocytopenia presented to the ED s/p unwitnessed fall at the living facility.  In the ED She is found to be confused and CT head and C-spine and x-ray pelvis were completed.  CT C-spine shows C1 ring fracture.  ER physician has discussed with on-call neurosurgery, who recommended to place patient on cervical collar and follow-up outpatient with neurosurgery. Patient EKG is concerning for second-degree AV block.  Cardiology consulted , recommended to continue telemetry and repeat echocardiogram.  Patient has been doing fine.  Assessment & Plan:   Principal Problem:   Acute encephalopathy Active Problems:   Stage 3 chronic kidney disease (HCC)   Hypothyroidism   Hypertensive urgency   Bradycardia   Acute lower UTI   Anemia   Thrombocytopenia (HCC)  Acute encephalopathy: > Improving. This could be secondary to post concussion or UTI. CT head negative for any acute abnormality.  UA consistent with UTI. Continue ceftriaxone, urine cultures were not sent.  Continue ceftriaxone for 3 days. Daughter reported whenever she gets UTIs,  she becomes confused. Patient has on and off waxing and waning in mental status. Overnight she was agitated and restless, was placed on soft restraints.  Bradycardia :  Patient was found to have bradycardia with EKG showed second-degree AV block type I Cardiology consulted, EKG in the morning showed normal sinus rhythm with sinus arrhythmia. Cardiology recommended to hold Bystolic and obtain echocardiogram. Heart rate remains normal.  Echocardiogram shows EF 60 to 65%..  No regional wall motion abnormalities  Hypertensive urgency: > Improved Continue amlodipine and losartan. Continue IV hydralazine as  needed.  C1 cervical fracture: Neurosurgery recommended cervical collar and outpatient neurosurgery follow-up. Patient reports feeling uncomfortable wearing c-collar, she has been refusing.  Hypothyroidism: Continue Synthroid  UTI:  Continue ceftriaxone, urine cultures were not sent. Continue ceftriaxone for 3 days.  CKD stage IIIb: Serum creatinine appears at baseline.  Chronic anemia and thrombocytopenia: Seems stable.   DVT prophylaxis: Heparin Code Status: DNR Family Communication: Spoke with daughter on the phone. Disposition Plan:   Status is: Inpatient  Remains inpatient appropriate because:Inpatient level of care appropriate due to severity of illness  Dispo: The patient is from: Home              Anticipated d/c is to: Home with home health services              Patient currently is not medically stable to d/c.   Difficult to place patient No  Consultants:  Cardiology  Procedures: Echocardiogram.  Antimicrobials: None.  Subjective: Patient was seen and examined at bedside.  Overnight events noted.   Patient was agitated and restless, trying to get out of the bed.   Patient was placed on soft restraints.  She is with one-to-one sitter.  Objective: Vitals:   03/10/21 1555 03/10/21 1929 03/10/21 2341 03/11/21 0431  BP: (!) 98/51 108/70 122/66 (!) 165/84  Pulse: 71 66 62 67  Resp: 17 16 16 20   Temp: 98.2 F (36.8 C) (!) 97.2 F (36.2 C) 97.7 F (36.5 C) 97.8 F (36.6 C)  TempSrc: Axillary Axillary Axillary Oral  SpO2: 95% 95% 98% 98%    Intake/Output Summary (Last 24 hours) at 03/11/2021 1123 Last data filed at 03/10/2021 1427 Gross per 24 hour  Intake 150 ml  Output --  Net 150 ml   There were no vitals filed for this visit.  Examination:  General exam: Appears comfortable, not in any acute distress, deconditioned. Respiratory system: Clear to auscultation bilaterally, RR 15  Cardiovascular system: S1-S2 heard, regular rate and rhythm, no  murmur. Gastrointestinal system: Abdomen is soft, nontender, nondistended, BS +. Central nervous system: Alert, arousable, oriented x 1.  No focal neurological deficits Extremities: No edema, no cyanosis, no clubbing. Skin: No rashes, lesions or ulcers Psychiatry: Mood & affect appropriate.     Data Reviewed: I have personally reviewed following labs and imaging studies  CBC: Recent Labs  Lab 03/08/21 0353 03/09/21 0706  WBC 7.3 6.8  NEUTROABS 3.4  --   HGB 11.6* 11.5*  HCT 36.3 33.6*  MCV 96.3 91.6  PLT 138* 841*   Basic Metabolic Panel: Recent Labs  Lab 03/08/21 0353 03/09/21 0706 03/10/21 0140  NA 142 140 139  K 4.3 3.6 3.9  CL 111 108 108  CO2 25 25 24   GLUCOSE 102* 89 83  BUN 26* 18 20  CREATININE 1.33* 1.04* 1.28*  CALCIUM 9.1 8.8* 8.7*  MG  --  1.9  --   PHOS  --  2.8  --    GFR: CrCl cannot be calculated (Unknown ideal weight.). Liver Function Tests: Recent Labs  Lab 03/08/21 0353 03/09/21 0706  AST 21 24  ALT 13 15  ALKPHOS 51 52  BILITOT 0.4 0.7  PROT 6.1* 5.8*  ALBUMIN 3.4* 3.4*   No results for input(s): LIPASE, AMYLASE in the last 168 hours. No results for input(s): AMMONIA in the last 168 hours. Coagulation Profile: No results for input(s): INR, PROTIME in the last 168 hours. Cardiac Enzymes: No results for input(s): CKTOTAL, CKMB, CKMBINDEX, TROPONINI in the last 168 hours. BNP (last 3 results) No results for input(s): PROBNP in the last 8760 hours. HbA1C: No results for input(s): HGBA1C in the last 72 hours. CBG: No results for input(s): GLUCAP in the last 168 hours. Lipid Profile: No results for input(s): CHOL, HDL, LDLCALC, TRIG, CHOLHDL, LDLDIRECT in the last 72 hours. Thyroid Function Tests: No results for input(s): TSH, T4TOTAL, FREET4, T3FREE, THYROIDAB in the last 72 hours.  Anemia Panel: No results for input(s): VITAMINB12, FOLATE, FERRITIN, TIBC, IRON, RETICCTPCT in the last 72 hours. Sepsis Labs: No results for  input(s): PROCALCITON, LATICACIDVEN in the last 168 hours.  Recent Results (from the past 240 hour(s))  Resp Panel by RT-PCR (Flu A&B, Covid) Nasopharyngeal Swab     Status: None   Collection Time: 03/08/21  1:40 AM   Specimen: Nasopharyngeal Swab; Nasopharyngeal(NP) swabs in vial transport medium  Result Value Ref Range Status   SARS Coronavirus 2 by RT PCR NEGATIVE NEGATIVE Final    Comment: (NOTE) SARS-CoV-2 target nucleic acids are NOT DETECTED.  The SARS-CoV-2 RNA is generally detectable in upper respiratory specimens during the acute phase of infection. The lowest concentration of SARS-CoV-2 viral copies this assay can detect is 138 copies/mL. A negative result does not preclude SARS-Cov-2 infection and should not be used as the sole basis for treatment or other patient management decisions. A negative result may occur with  improper specimen collection/handling, submission of specimen other than nasopharyngeal swab, presence of viral mutation(s) within the areas targeted by this assay, and inadequate number of viral copies(<138 copies/mL). A negative result must be combined with clinical observations, patient history, and epidemiological information. The expected result is Negative.  Fact Sheet for  Patients:  EntrepreneurPulse.com.au  Fact Sheet for Healthcare Providers:  IncredibleEmployment.be  This test is no t yet approved or cleared by the [place] FDA and  has been authorized for detection and/or diagnosis of SARS-CoV-2 by FDA under an Emergency Use Authorization (EUA). This EUA will remain  in effect (meaning this test can be used) for the duration of the COVID-19 declaration under Section 564(b)(1) of the Act, 21 U.S.C.section 360bbb-3(b)(1), unless the authorization is terminated  or revoked sooner.       Influenza A by PCR NEGATIVE NEGATIVE Final   Influenza B by PCR NEGATIVE NEGATIVE Final    Comment: (NOTE) The  Xpert Xpress SARS-CoV-2/FLU/RSV plus assay is intended as an aid in the diagnosis of influenza from Nasopharyngeal swab specimens and should not be used as a sole basis for treatment. Nasal washings and aspirates are unacceptable for Xpert Xpress SARS-CoV-2/FLU/RSV testing.  Fact Sheet for Patients: EntrepreneurPulse.com.au  Fact Sheet for Healthcare Providers: IncredibleEmployment.be  This test is not yet approved or cleared by the [place] FDA and has been authorized for detection and/or diagnosis of SARS-CoV-2 by FDA under an Emergency Use Authorization (EUA). This EUA will remain in effect (meaning this test can be used) for the duration of the COVID-19 declaration under Section 564(b)(1) of the Act, 21 U.S.C. section 360bbb-3(b)(1), unless the authorization is terminated or revoked.  Performed at [facility], [place] [address] [place], [place] [address]     Radiology Studies: ECHOCARDIOGRAM COMPLETE  Result Date: 03/10/2021    ECHOCARDIOGRAM REPORT   Patient Name:   SHADY PADRON Date of Exam: 03/10/2021 Medical Rec #:  [id number]        Height:       62.0 in Accession #:    [id number]       Weight:       120.0 lb Date of Birth:  1922/01/09        BSA:          1.539 m Patient Age:    18 years         BP:           110/60 mmHg Patient Gender: F                HR:           60 bpm. Exam Location:  Inpatient Procedure: 2D Echo Indications:     R00.1 Bradycardia, unspecified; R55 Syncope  History:         Patient has no prior history of Echocardiogram examinations.                  Arrythmias:Bradycardia.  Sonographer:     Restaurant manager, fast food Referring Phys:  [place] Diagnosing Phys: Dixie Dials MD IMPRESSIONS  1. Left ventricular ejection fraction, by estimation, is 60 to 65%. The left ventricle has normal function. The left ventricle has no regional wall motion abnormalities. There is moderate concentric left ventricular  hypertrophy. Left ventricular diastolic parameters are consistent with Grade I diastolic dysfunction (impaired relaxation).  2. Right ventricular systolic function is normal. The right ventricular size is normal. Mildly increased right ventricular wall thickness.  3. Left atrial size was moderately dilated.  4. Right atrial size was mildly dilated.  5. The mitral valve is degenerative. Mild mitral valve regurgitation.  6. The aortic valve is bicuspid. There is severe calcifcation of the aortic valve. There is moderate thickening of the aortic valve. Aortic valve regurgitation is not visualized.  Mild aortic valve stenosis.  7. There is mild (Grade II) atheroma plaque involving the aortic root and ascending aorta.  8. The inferior vena cava is normal in size with greater than 50% respiratory variability, suggesting right atrial pressure of 3 mmHg. FINDINGS  Left Ventricle: Left ventricular ejection fraction, by estimation, is 60 to 65%. The left ventricle has normal function. The left ventricle has no regional wall motion abnormalities. The left ventricular internal cavity size was small. There is moderate  concentric left ventricular hypertrophy. Left ventricular diastolic parameters are consistent with Grade I diastolic dysfunction (impaired relaxation). Right Ventricle: The right ventricular size is normal. Mildly increased right ventricular wall thickness. Right ventricular systolic function is normal. Left Atrium: Left atrial size was moderately dilated. Right Atrium: Right atrial size was mildly dilated. Pericardium: There is no evidence of pericardial effusion. Mitral Valve: The mitral valve is degenerative in appearance. There is mild thickening of the mitral valve leaflet(s). There is mild calcification of the mitral valve leaflet(s). Mild mitral annular calcification. Mild mitral valve regurgitation. Tricuspid Valve: The tricuspid valve is normal in structure. Tricuspid valve regurgitation is trivial. Aortic  Valve: The aortic valve is bicuspid. There is severe calcifcation of the aortic valve. There is moderate thickening of the aortic valve. There is moderate aortic valve annular calcification. Aortic valve regurgitation is not visualized. Mild aortic stenosis is present. Aortic valve mean gradient measures 2.0 mmHg. Aortic valve peak gradient measures 3.4 mmHg. Aortic valve area, by VTI measures 2.01 cm. Pulmonic Valve: The pulmonic valve was normal in structure. Pulmonic valve regurgitation is not visualized. Aorta: The aortic root is normal in size and structure. There is mild (Grade II) atheroma plaque involving the aortic root and ascending aorta. Venous: The inferior vena cava is normal in size with greater than 50% respiratory variability, suggesting right atrial pressure of 3 mmHg. IAS/Shunts: The atrial septum is grossly normal.  LEFT VENTRICLE PLAX 2D LVIDd:         3.60 cm  Diastology LVIDs:         1.70 cm  LV e' medial:    4.37 cm/s LV PW:         1.30 cm  LV E/e' medial:  10.4 LV IVS:        1.30 cm  LV e' lateral:   4.37 cm/s LVOT diam:     1.80 cm  LV E/e' lateral: 10.4 LV SV:         34 LV SV Index:   22 LVOT Area:     2.54 cm  RIGHT VENTRICLE RV S prime:     16.90 cm/s TAPSE (M-mode): 2.1 cm LEFT ATRIUM             Index       RIGHT ATRIUM          Index LA diam:        4.00 cm 2.60 cm/m  RA Area:     9.78 cm LA Vol (A2C):   39.2 ml 25.48 ml/m RA Volume:   17.10 ml 11.11 ml/m LA Vol (A4C):   46.6 ml 30.29 ml/m LA Biplane Vol: 44.1 ml 28.66 ml/m  AORTIC VALVE AV Area (Vmax):    1.95 cm AV Area (Vmean):   1.78 cm AV Area (VTI):     2.01 cm AV Vmax:           92.30 cm/s AV Vmean:          64.800 cm/s AV VTI:  0.170 m AV Peak Grad:      3.4 mmHg AV Mean Grad:      2.0 mmHg LVOT Vmax:         70.70 cm/s LVOT Vmean:        45.400 cm/s LVOT VTI:          0.134 m LVOT/AV VTI ratio: 0.79  AORTA Ao Root diam: 3.10 cm Ao Asc diam:  3.10 cm MITRAL VALVE               TRICUSPID VALVE MV Area  (PHT): 2.03 cm    TR Peak grad:   4.2 mmHg MV Decel Time: 373 msec    TR Vmax:        103.00 cm/s MV E velocity: 45.60 cm/s MV A velocity: 50.90 cm/s  SHUNTS MV E/A ratio:  0.90        Systemic VTI:  0.13 m                            Systemic Diam: 1.80 cm Dixie Dials MD Electronically signed by Dixie Dials MD Signature Date/Time: 03/10/2021/10:36:38 AM    Final     Scheduled Meds:  amLODipine  10 mg Oral Daily   fentaNYL  1 patch Transdermal Q3 days   heparin  5,000 Units Subcutaneous Q8H   levothyroxine  75 mcg Oral QAC breakfast   losartan  100 mg Oral Daily   omega-3 acid ethyl esters  1 g Oral Daily   polyethylene glycol  17 g Oral Daily   senna  8.6 mg Oral Daily   Continuous Infusions:  cefTRIAXone (ROCEPHIN)  IV 1 g (03/11/21 1052)     LOS: 2 days    Time spent: 25 mins  Lakisha Peyser, MD [facility]   If 7PM-7AM, please contact night-coverage

## 2021-03-12 DIAGNOSIS — G9340 Encephalopathy, unspecified: Secondary | ICD-10-CM | POA: Diagnosis not present

## 2021-03-12 LAB — RESP PANEL BY RT-PCR (FLU A&B, COVID) ARPGX2
Influenza A by PCR: NEGATIVE
Influenza B by PCR: NEGATIVE
SARS Coronavirus 2 by RT PCR: POSITIVE — AB

## 2021-03-12 LAB — CBC
HCT: 34.1 % — ABNORMAL LOW (ref 36.0–46.0)
Hemoglobin: 11.6 g/dL — ABNORMAL LOW (ref 12.0–15.0)
MCH: 31.6 pg (ref 26.0–34.0)
MCHC: 34 g/dL (ref 30.0–36.0)
MCV: 92.9 fL (ref 80.0–100.0)
Platelets: 135 10*3/uL — ABNORMAL LOW (ref 150–400)
RBC: 3.67 MIL/uL — ABNORMAL LOW (ref 3.87–5.11)
RDW: 12.2 % (ref 11.5–15.5)
WBC: 6.2 10*3/uL (ref 4.0–10.5)
nRBC: 0 % (ref 0.0–0.2)

## 2021-03-12 LAB — BASIC METABOLIC PANEL
Anion gap: 7 (ref 5–15)
BUN: 25 mg/dL — ABNORMAL HIGH (ref 8–23)
CO2: 26 mmol/L (ref 22–32)
Calcium: 9.3 mg/dL (ref 8.9–10.3)
Chloride: 107 mmol/L (ref 98–111)
Creatinine, Ser: 1.52 mg/dL — ABNORMAL HIGH (ref 0.44–1.00)
GFR, Estimated: 31 mL/min — ABNORMAL LOW (ref >60)
Glucose, Bld: 109 mg/dL — ABNORMAL HIGH (ref 70–99)
Potassium: 3.9 mmol/L (ref 3.5–5.1)
Sodium: 140 mmol/L (ref 135–145)

## 2021-03-12 MED ORDER — NYSTATIN 100000 UNIT/GM EX CREA
1.0000 "application " | TOPICAL_CREAM | Freq: Two times a day (BID) | CUTANEOUS | Status: DC
  Administered 2021-03-12 – 2021-03-18 (×10): 1 via TOPICAL
  Filled 2021-03-12: qty 15

## 2021-03-12 MED ORDER — LORAZEPAM 2 MG/ML IJ SOLN
0.2500 mg | Freq: Once | INTRAMUSCULAR | Status: AC
  Administered 2021-03-12: 0.25 mg via INTRAVENOUS
  Filled 2021-03-12: qty 1

## 2021-03-12 MED ORDER — HYDRALAZINE HCL 25 MG PO TABS
25.0000 mg | ORAL_TABLET | Freq: Three times a day (TID) | ORAL | Status: DC
  Administered 2021-03-12 – 2021-03-17 (×12): 25 mg via ORAL
  Filled 2021-03-12 (×16): qty 1

## 2021-03-12 MED ORDER — QUETIAPINE FUMARATE 25 MG PO TABS
25.0000 mg | ORAL_TABLET | Freq: Every day | ORAL | Status: DC
  Administered 2021-03-12 – 2021-03-16 (×4): 25 mg via ORAL
  Filled 2021-03-12 (×4): qty 1

## 2021-03-12 MED ORDER — SODIUM CHLORIDE 0.9 % IV BOLUS
500.0000 mL | Freq: Once | INTRAVENOUS | Status: AC
  Administered 2021-03-12: 500 mL via INTRAVENOUS

## 2021-03-12 NOTE — Progress Notes (Signed)
PROGRESS NOTE    Alexandra Alexander  VOH:[id number] DOB: 1921-09-16 DOA: 03/07/2021 PCP: [name], MD   Brief Narrative:  This 87 years old female with PMH significant for hypertension, hypothyroidism, chronic kidney disease stage IIIb, anemia and thrombocytopenia presented to the ED s/p unwitnessed fall at the living facility.  In the ED She is found to be confused and CT head and C-spine and x-ray pelvis were completed.  CT C-spine shows C1 ring fracture.  ER physician has discussed with on-call neurosurgery, who recommended to place patient on cervical collar and follow-up outpatient with neurosurgery. Patient EKG is concerning for second-degree AV block.  Cardiology consulted , recommended to continue telemetry and repeat echocardiogram.  Patient has been doing fine.  She completed treatment for UTI.  She has waxing and waning in mental status requiring one-to-one Air cabin crew.  Assessment & Plan:   Principal Problem:   Acute encephalopathy Active Problems:   Stage 3 chronic kidney disease (HCC)   Hypothyroidism   Hypertensive urgency   Bradycardia   Acute lower UTI   Anemia   Thrombocytopenia (HCC)  Acute encephalopathy: > Improving. This could be secondary to post concussion or UTI. CT head negative for any acute abnormality.  UA consistent with UTI. Completed ceftriaxone for 3 days. Daughter reported whenever she gets UTIs,  she becomes confused. Patient has on and off waxing and waning in mental status, requiring one-to-one Air cabin crew.. Overnight she was agitated and restless, was placed on soft restraints. Start Seroquel 25 mg at bedtime.  Bradycardia :  Patient was found to have bradycardia with EKG showed second-degree AV block type I Cardiology consulted, EKG in the morning showed normal sinus rhythm with sinus arrhythmia. Cardiology recommended to hold Bystolic and obtain echocardiogram. Heart rate remains normal.  Echocardiogram shows EF 60 to 65%..  No  regional wall motion abnormalities. Cardiology signed off.  Hypertensive urgency: > Improved Continue amlodipine and losartan. Continue IV hydralazine as needed.  C1 cervical fracture: Neurosurgery recommended cervical collar and outpatient neurosurgery follow-up. Patient reports feeling uncomfortable wearing c-collar, she has been refusing.  Hypothyroidism: Continue Synthroid.  UTI:  Completed ceftriaxone for 3 days.  AKI on CKD stage IIIb: Serum creatinine remained at baseline, spiked to 1.52 today Losartan discontinued, given IV fluid bolus. Recheck BMP in the morning.  Chronic anemia and thrombocytopenia: Seems stable.   DVT prophylaxis: Heparin Code Status: DNR Family Communication: Spoke with daughter on the phone. Disposition Plan:   Status is: Inpatient  Remains inpatient appropriate because:Inpatient level of care appropriate due to severity of illness  Dispo: The patient is from: Home              Anticipated d/c is to: Home with home health services              Patient currently is not medically stable to d/c.   Difficult to place patient No  Consultants:  Cardiology  Procedures: Echocardiogram.  Antimicrobials: None.  Subjective: Patient was seen and examined at bedside.  Overnight events noted.   Patient was agitated and restless last night, She was given lorazepam. Patient was placed on soft restraints.  She is still requiring one-to-one sitter.  Objective: Vitals:   03/11/21 2007 03/12/21 0003 03/12/21 0356 03/12/21 0817  BP: 125/67 118/71 (!) 157/84 (!) 143/74  Pulse: 99 70 72 65  Resp: 18 20 20 17   Temp: (!) 97.5 F (36.4 C) 97.7 F (36.5 C) 97.7 F (36.5 C) 97.8 F (36.6 C)  TempSrc:  Oral Oral Oral Oral  SpO2: 96% 96% 97% 95%    Intake/Output Summary (Last 24 hours) at 03/12/2021 1207 Last data filed at 03/12/2021 0400 Gross per 24 hour  Intake 120 ml  Output --  Net 120 ml   There were no vitals filed for this  visit.  Examination:  General exam: Appears comfortable, deconditioned, not in any acute distress. Respiratory system: Clear to auscultation bilaterally, RR 15  Cardiovascular system: S1-S2 heard, regular rate and rhythm, no murmur. Gastrointestinal system: Soft, nontender, nondistended, BS+. Central nervous system: Alert, Awake, oriented x 1.  No focal neurological deficits Extremities: No edema, no cyanosis, no clubbing. Skin: No rashes, lesions or ulcers Psychiatry: Mood & affect appropriate.     Data Reviewed: I have personally reviewed following labs and imaging studies  CBC: Recent Labs  Lab 03/08/21 0353 03/09/21 0706 03/12/21 0351  WBC 7.3 6.8 6.2  NEUTROABS 3.4  --   --   HGB 11.6* 11.5* 11.6*  HCT 36.3 33.6* 34.1*  MCV 96.3 91.6 92.9  PLT 138* 136* 017*   Basic Metabolic Panel: Recent Labs  Lab 03/08/21 0353 03/09/21 0706 03/10/21 0140 03/12/21 0351  NA 142 140 139 140  K 4.3 3.6 3.9 3.9  CL 111 108 108 107  CO2 25 25 24 26   GLUCOSE 102* 89 83 109*  BUN 26* 18 20 25*  CREATININE 1.33* 1.04* 1.28* 1.52*  CALCIUM 9.1 8.8* 8.7* 9.3  MG  --  1.9  --   --   PHOS  --  2.8  --   --    GFR: CrCl cannot be calculated (Unknown ideal weight.). Liver Function Tests: Recent Labs  Lab 03/08/21 0353 03/09/21 0706  AST 21 24  ALT 13 15  ALKPHOS 51 52  BILITOT 0.4 0.7  PROT 6.1* 5.8*  ALBUMIN 3.4* 3.4*   No results for input(s): LIPASE, AMYLASE in the last 168 hours. No results for input(s): AMMONIA in the last 168 hours. Coagulation Profile: No results for input(s): INR, PROTIME in the last 168 hours. Cardiac Enzymes: No results for input(s): CKTOTAL, CKMB, CKMBINDEX, TROPONINI in the last 168 hours. BNP (last 3 results) No results for input(s): PROBNP in the last 8760 hours. HbA1C: No results for input(s): HGBA1C in the last 72 hours. CBG: No results for input(s): GLUCAP in the last 168 hours. Lipid Profile: No results for input(s): CHOL, HDL,  LDLCALC, TRIG, CHOLHDL, LDLDIRECT in the last 72 hours. Thyroid Function Tests: No results for input(s): TSH, T4TOTAL, FREET4, T3FREE, THYROIDAB in the last 72 hours.  Anemia Panel: No results for input(s): VITAMINB12, FOLATE, FERRITIN, TIBC, IRON, RETICCTPCT in the last 72 hours. Sepsis Labs: No results for input(s): PROCALCITON, LATICACIDVEN in the last 168 hours.  Recent Results (from the past 240 hour(s))  Resp Panel by RT-PCR (Flu A&B, Covid) Nasopharyngeal Swab     Status: None   Collection Time: 03/08/21  1:40 AM   Specimen: Nasopharyngeal Swab; Nasopharyngeal(NP) swabs in vial transport medium  Result Value Ref Range Status   SARS Coronavirus 2 by RT PCR NEGATIVE NEGATIVE Final    Comment: (NOTE) SARS-CoV-2 target nucleic acids are NOT DETECTED.  The SARS-CoV-2 RNA is generally detectable in upper respiratory specimens during the acute phase of infection. The lowest concentration of SARS-CoV-2 viral copies this assay can detect is 138 copies/mL. A negative result does not preclude SARS-Cov-2 infection and should not be used as the sole basis for treatment or other patient management decisions. A negative result  may occur with  improper specimen collection/handling, submission of specimen other than nasopharyngeal swab, presence of viral mutation(s) within the areas targeted by this assay, and inadequate number of viral copies(<138 copies/mL). A negative result must be combined with clinical observations, patient history, and epidemiological information. The expected result is Negative.  Fact Sheet for Patients:  EntrepreneurPulse.com.au  Fact Sheet for Healthcare Providers:  IncredibleEmployment.be  This test is no t yet approved or cleared by the [place] FDA and  has been authorized for detection and/or diagnosis of SARS-CoV-2 by FDA under an Emergency Use Authorization (EUA). This EUA will remain  in effect (meaning this test  can be used) for the duration of the COVID-19 declaration under Section 564(b)(1) of the Act, 21 U.S.C.section 360bbb-3(b)(1), unless the authorization is terminated  or revoked sooner.       Influenza A by PCR NEGATIVE NEGATIVE Final   Influenza B by PCR NEGATIVE NEGATIVE Final    Comment: (NOTE) The Xpert Xpress SARS-CoV-2/FLU/RSV plus assay is intended as an aid in the diagnosis of influenza from Nasopharyngeal swab specimens and should not be used as a sole basis for treatment. Nasal washings and aspirates are unacceptable for Xpert Xpress SARS-CoV-2/FLU/RSV testing.  Fact Sheet for Patients: EntrepreneurPulse.com.au  Fact Sheet for Healthcare Providers: IncredibleEmployment.be  This test is not yet approved or cleared by the [place] FDA and has been authorized for detection and/or diagnosis of SARS-CoV-2 by FDA under an Emergency Use Authorization (EUA). This EUA will remain in effect (meaning this test can be used) for the duration of the COVID-19 declaration under Section 564(b)(1) of the Act, 21 U.S.C. section 360bbb-3(b)(1), unless the authorization is terminated or revoked.  Performed at [facility], [place] [address], [place], [place] [address]     Radiology Studies: No results found.  Scheduled Meds:  amLODipine  10 mg Oral Daily   fentaNYL  1 patch Transdermal Q3 days   heparin  5,000 Units Subcutaneous Q8H   hydrALAZINE  25 mg Oral Q8H   levothyroxine  75 mcg Oral QAC breakfast   omega-3 acid ethyl esters  1 g Oral Daily   polyethylene glycol  17 g Oral Daily   QUEtiapine  25 mg Oral QHS   senna  8.6 mg Oral Daily   Continuous Infusions:     LOS: 3 days    Time spent: 25 mins  Shawna Clamp, MD [facility]   If 7PM-7AM, please contact night-coverage

## 2021-03-12 NOTE — Care Management Important Message (Signed)
Important Message  Patient Details  Name: Alexandra Alexander MRN: [id number] Date of Birth: 1921/08/24   Medicare Important Message Given:  Yes     Orbie Pyo 03/12/2021, 3:46 PM

## 2021-03-12 NOTE — Plan of Care (Signed)

## 2021-03-12 NOTE — Progress Notes (Signed)
Pt remains awake, naked, and active in bed. Pt talking and singing.

## 2021-03-12 NOTE — Progress Notes (Signed)
Dr.Karkandy paged re: pt remains awake and restless. Keeps going to foot of bed but sitter there so hasn't gotten out of bed. Won't leave gown on or lay down. Not able to redirect. No prn meds ordered at this time.

## 2021-03-13 DIAGNOSIS — G9340 Encephalopathy, unspecified: Secondary | ICD-10-CM | POA: Diagnosis not present

## 2021-03-13 LAB — BASIC METABOLIC PANEL
Anion gap: 11 (ref 5–15)
BUN: 22 mg/dL (ref 8–23)
CO2: 24 mmol/L (ref 22–32)
Calcium: 9.5 mg/dL (ref 8.9–10.3)
Chloride: 105 mmol/L (ref 98–111)
Creatinine, Ser: 1.31 mg/dL — ABNORMAL HIGH (ref 0.44–1.00)
GFR, Estimated: 37 mL/min — ABNORMAL LOW (ref >60)
Glucose, Bld: 140 mg/dL — ABNORMAL HIGH (ref 70–99)
Potassium: 3.9 mmol/L (ref 3.5–5.1)
Sodium: 140 mmol/L (ref 135–145)

## 2021-03-13 LAB — RESP PANEL BY RT-PCR (FLU A&B, COVID) ARPGX2
Influenza A by PCR: NEGATIVE
Influenza B by PCR: NEGATIVE
SARS Coronavirus 2 by RT PCR: NEGATIVE

## 2021-03-13 MED ORDER — SODIUM CHLORIDE 0.9 % IV SOLN: INTRAVENOUS | Status: AC

## 2021-03-13 NOTE — Progress Notes (Signed)
PROGRESS NOTE    Alexandra Alexander  GLO:[id number] DOB: 08/24/1921 DOA: 03/07/2021 PCP: [name], MD   Brief Narrative:  This 87 years old female with PMH significant for hypertension, hypothyroidism, chronic kidney disease stage IIIb, anemia and thrombocytopenia presented to the ED s/p unwitnessed fall at the living facility.  In the ED She is found to be confused and CT head and C-spine and x-ray pelvis were completed.  CT C-spine shows C1 ring fracture.  ER physician has discussed with on-call neurosurgery, who recommended to place patient on cervical collar and follow-up outpatient with neurosurgery. Patient EKG is concerning for second-degree AV block.  Cardiology consulted , recommended to continue telemetry and repeat echocardiogram.  Patient has been doing fine.  She completed treatment for UTI.  She has waxing and waning in mental status requiring one-to-one Air cabin crew. Incidentally patient is found to have COVID-positive.  She is not having any respiratory or GI symptoms.  Assessment & Plan:   Principal Problem:   Acute encephalopathy Active Problems:   Stage 3 chronic kidney disease (HCC)   Hypothyroidism   Hypertensive urgency   Bradycardia   Acute lower UTI   Anemia   Thrombocytopenia (HCC)  Acute encephalopathy: > Improving. This could be secondary to post concussion or UTI. CT head negative for any acute abnormality.  UA consistent with UTI. Completed ceftriaxone for 3 days. Daughter reported whenever she gets UTIs,  she becomes confused. Patient has on and off waxing and waning in mental status, requiring one-to-one Air cabin crew.. She becomes agitated and restless in the night, placed on soft restraints at night. Started Seroquel 25 mg at bedtime on 02/10/21..  Bradycardia : > Improved. Patient was found to have bradycardia with EKG showed second-degree AV block type I Cardiology consulted, EKG in the morning showed normal sinus rhythm with sinus  arrhythmia. Cardiology recommended to hold Bystolic and obtain echocardiogram. Heart rate remains normal.  Echocardiogram shows EF 60 to 65%..  No regional wall motion abnormalities. Cardiology signed off.  Hypertensive urgency: > Improved Continue amlodipine and Hydralazine BP controlled.  C1 cervical fracture: Neurosurgery recommended cervical collar and outpatient neurosurgery follow-up. Patient reports feeling uncomfortable wearing c-collar, she has been refusing.  Hypothyroidism: Continue Synthroid.  UTI:  Completed ceftriaxone for 3 days.  AKI on CKD stage IIIb: Serum creatinine remained at baseline, spiked to 1.52  on 03/12/21 Losartan discontinued, given IV fluid bolus. Recheck BMP shows improvement in serum creatinine 1.3  Chronic anemia and thrombocytopenia: Seems stable.   COVID+ Patient does not have respiratory or GI symptoms. She is found to have incidental COVID+, done for ALF requirement. Repeat COVID test just to confirm reliability of the test. First COVID test on 9/27 negative. Continue airborne and contact precautions.   DVT prophylaxis: Heparin Code Status: DNR Family Communication: Spoke with daughter on the phone. Disposition Plan:   Status is: Inpatient  Remains inpatient appropriate because:Inpatient level of care appropriate due to severity of illness  Dispo: The patient is from: Home              Anticipated d/c is to: Assisted living facility              Patient currently is not medically stable to d/c.   Difficult to place patient No  Consultants:  Cardiology  Procedures: Echocardiogram.  Antimicrobials: None.  Subjective: Patient was seen and examined at bedside.  Overnight events noted.   Patient was less agitated and restless last night. She was placed on  soft restraints. She does not have any respiratory or GI symptoms, incidental COVID+   Objective: Vitals:   03/12/21 1926 03/13/21 0005 03/13/21 0330 03/13/21 0812   BP: (!) 150/68 (!) 146/77 136/88 (P) 139/78  Pulse: 65 72 76 (P) 60  Resp: 18 18 18  (P) 18  Temp: 97.6 F (36.4 C) 97.8 F (36.6 C) 97.6 F (36.4 C) (P) 98.8 F (37.1 C)  TempSrc: Axillary Oral Axillary (P) Axillary  SpO2: 100% 98% 100% (P) 97%    Intake/Output Summary (Last 24 hours) at 03/13/2021 1133 Last data filed at 03/12/2021 2230 Gross per 24 hour  Intake 380 ml  Output --  Net 380 ml   There were no vitals filed for this visit.  Examination:  General exam: Appears comfortable, deconditioned, not in any acute distress.   Respiratory system: Clear to auscultation bilaterally, RR 15  Cardiovascular system: S1-S2 heard, regular rate and rhythm, no murmur. Gastrointestinal system: Soft, nontender, nondistended, BS+. Central nervous system: Alert, Awake, oriented x 1.  No focal neurological deficits Extremities: No edema, no cyanosis, no clubbing. Skin: No rashes, lesions or ulcers Psychiatry: Mood & affect appropriate.     Data Reviewed: I have personally reviewed following labs and imaging studies  CBC: Recent Labs  Lab 03/08/21 0353 03/09/21 0706 03/12/21 0351  WBC 7.3 6.8 6.2  NEUTROABS 3.4  --   --   HGB 11.6* 11.5* 11.6*  HCT 36.3 33.6* 34.1*  MCV 96.3 91.6 92.9  PLT 138* 136* 094*   Basic Metabolic Panel: Recent Labs  Lab 03/08/21 0353 03/09/21 0706 03/10/21 0140 03/12/21 0351 03/13/21 0511  NA 142 140 139 140 140  K 4.3 3.6 3.9 3.9 3.9  CL 111 108 108 107 105  CO2 25 25 24 26 24   GLUCOSE 102* 89 83 109* 140*  BUN 26* 18 20 25* 22  CREATININE 1.33* 1.04* 1.28* 1.52* 1.31*  CALCIUM 9.1 8.8* 8.7* 9.3 9.5  MG  --  1.9  --   --   --   PHOS  --  2.8  --   --   --    GFR: CrCl cannot be calculated (Unknown ideal weight.). Liver Function Tests: Recent Labs  Lab 03/08/21 0353 03/09/21 0706  AST 21 24  ALT 13 15  ALKPHOS 51 52  BILITOT 0.4 0.7  PROT 6.1* 5.8*  ALBUMIN 3.4* 3.4*   No results for input(s): LIPASE, AMYLASE in the last  168 hours. No results for input(s): AMMONIA in the last 168 hours. Coagulation Profile: No results for input(s): INR, PROTIME in the last 168 hours. Cardiac Enzymes: No results for input(s): CKTOTAL, CKMB, CKMBINDEX, TROPONINI in the last 168 hours. BNP (last 3 results) No results for input(s): PROBNP in the last 8760 hours. HbA1C: No results for input(s): HGBA1C in the last 72 hours. CBG: No results for input(s): GLUCAP in the last 168 hours. Lipid Profile: No results for input(s): CHOL, HDL, LDLCALC, TRIG, CHOLHDL, LDLDIRECT in the last 72 hours. Thyroid Function Tests: No results for input(s): TSH, T4TOTAL, FREET4, T3FREE, THYROIDAB in the last 72 hours.  Anemia Panel: No results for input(s): VITAMINB12, FOLATE, FERRITIN, TIBC, IRON, RETICCTPCT in the last 72 hours. Sepsis Labs: No results for input(s): PROCALCITON, LATICACIDVEN in the last 168 hours.  Recent Results (from the past 240 hour(s))  Resp Panel by RT-PCR (Flu A&B, Covid) Nasopharyngeal Swab     Status: None   Collection Time: 03/08/21  1:40 AM   Specimen: Nasopharyngeal Swab; Nasopharyngeal(NP) swabs  in vial transport medium  Result Value Ref Range Status   SARS Coronavirus 2 by RT PCR NEGATIVE NEGATIVE Final    Comment: (NOTE) SARS-CoV-2 target nucleic acids are NOT DETECTED.  The SARS-CoV-2 RNA is generally detectable in upper respiratory specimens during the acute phase of infection. The lowest concentration of SARS-CoV-2 viral copies this assay can detect is 138 copies/mL. A negative result does not preclude SARS-Cov-2 infection and should not be used as the sole basis for treatment or other patient management decisions. A negative result may occur with  improper specimen collection/handling, submission of specimen other than nasopharyngeal swab, presence of viral mutation(s) within the areas targeted by this assay, and inadequate number of viral copies(<138 copies/mL). A negative result must be combined  with clinical observations, patient history, and epidemiological information. The expected result is Negative.  Fact Sheet for Patients:  EntrepreneurPulse.com.au  Fact Sheet for Healthcare Providers:  IncredibleEmployment.be  This test is no t yet approved or cleared by the [place] FDA and  has been authorized for detection and/or diagnosis of SARS-CoV-2 by FDA under an Emergency Use Authorization (EUA). This EUA will remain  in effect (meaning this test can be used) for the duration of the COVID-19 declaration under Section 564(b)(1) of the Act, 21 U.S.C.section 360bbb-3(b)(1), unless the authorization is terminated  or revoked sooner.       Influenza A by PCR NEGATIVE NEGATIVE Final   Influenza B by PCR NEGATIVE NEGATIVE Final    Comment: (NOTE) The Xpert Xpress SARS-CoV-2/FLU/RSV plus assay is intended as an aid in the diagnosis of influenza from Nasopharyngeal swab specimens and should not be used as a sole basis for treatment. Nasal washings and aspirates are unacceptable for Xpert Xpress SARS-CoV-2/FLU/RSV testing.  Fact Sheet for Patients: EntrepreneurPulse.com.au  Fact Sheet for Healthcare Providers: IncredibleEmployment.be  This test is not yet approved or cleared by the [place] FDA and has been authorized for detection and/or diagnosis of SARS-CoV-2 by FDA under an Emergency Use Authorization (EUA). This EUA will remain in effect (meaning this test can be used) for the duration of the COVID-19 declaration under Section 564(b)(1) of the Act, 21 U.S.C. section 360bbb-3(b)(1), unless the authorization is terminated or revoked.  Performed at [facility], [place] [address] [place], [place] [address]   Resp Panel by RT-PCR (Flu A&B, Covid) Nasopharyngeal Swab     Status: Abnormal   Collection Time: 03/12/21  3:52 PM   Specimen: Nasopharyngeal Swab; Nasopharyngeal(NP) swabs in  vial transport medium  Result Value Ref Range Status   SARS Coronavirus 2 by RT PCR POSITIVE (A) NEGATIVE Final    Comment: RESULT CALLED TO, READ BACK BY AND VERIFIED WITH: Jeani Hawking RN [id number] 03/12/21 A BROWNING (NOTE) SARS-CoV-2 target nucleic acids are DETECTED.  The SARS-CoV-2 RNA is generally detectable in upper respiratory specimens during the acute phase of infection. Positive results are indicative of the presence of the identified virus, but do not rule out bacterial infection or co-infection with other pathogens not detected by the test. Clinical correlation with patient history and other diagnostic information is necessary to determine patient infection status. The expected result is Negative.  Fact Sheet for Patients: EntrepreneurPulse.com.au  Fact Sheet for Healthcare Providers: IncredibleEmployment.be  This test is not yet approved or cleared by the [place] FDA and  has been authorized for detection and/or diagnosis of SARS-CoV-2 by FDA under an Emergency Use Authorization (EUA).  This EUA will remain in effect (meaning this test can  be used) for the duration of  the COVID-19 declaration under Section 564(b)(1) of the Act, 21 U.S.C. section 360bbb-3(b)(1), unless the authorization is terminated or revoked sooner.     Influenza A by PCR NEGATIVE NEGATIVE Final   Influenza B by PCR NEGATIVE NEGATIVE Final    Comment: (NOTE) The Xpert Xpress SARS-CoV-2/FLU/RSV plus assay is intended as an aid in the diagnosis of influenza from Nasopharyngeal swab specimens and should not be used as a sole basis for treatment. Nasal washings and aspirates are unacceptable for Xpert Xpress SARS-CoV-2/FLU/RSV testing.  Fact Sheet for Patients: EntrepreneurPulse.com.au  Fact Sheet for Healthcare Providers: IncredibleEmployment.be  This test is not yet approved or cleared by the [place] FDA and has  been authorized for detection and/or diagnosis of SARS-CoV-2 by FDA under an Emergency Use Authorization (EUA). This EUA will remain in effect (meaning this test can be used) for the duration of the COVID-19 declaration under Section 564(b)(1) of the Act, 21 U.S.C. section 360bbb-3(b)(1), unless the authorization is terminated or revoked.  Performed at [facility], [place] [address], [place], [place] [address]     Radiology Studies: No results found.  Scheduled Meds:  amLODipine  10 mg Oral Daily   fentaNYL  1 patch Transdermal Q3 days   heparin  5,000 Units Subcutaneous Q8H   hydrALAZINE  25 mg Oral Q8H   levothyroxine  75 mcg Oral QAC breakfast   nystatin cream  1 application Topical BID   omega-3 acid ethyl esters  1 g Oral Daily   polyethylene glycol  17 g Oral Daily   QUEtiapine  25 mg Oral QHS   senna  8.6 mg Oral Daily   Continuous Infusions:  sodium chloride 50 mL/hr at 03/13/21 1043      LOS: 4 days    Time spent: 25 mins  Laporshia Hogen, MD [facility]   If 7PM-7AM, please contact night-coverage

## 2021-03-13 NOTE — Progress Notes (Signed)
Physical Therapy Treatment Patient Details Name: Alexandra Alexander MRN: [id number] DOB: 05-14-1922 Today's Date: 03/13/2021   History of Present Illness Pt is a 87 y/o female who presented to the ED s/p unwitnessed fall at her assisted living facility. In the ED She is found to be confused and CT head and C-spine and x-ray pelvis were completed.  CT C-spine shows C1 ring fracture. PMH significant for hypertension, hypothyroidism, chronic kidney disease stage IIIb, prior R BKA (prosthesis in room).    PT Comments    Patient with limited arousal again this session.  Did make it up to chair, but poor postural positioning with continued lethargy and no c-collar for support so returned to supine for safety.  Sitter in the room.  Feel she may need SNF unless she improves attention/arousal/ ability to participate in self care.  PT to continue to follow.    Recommendations for follow up therapy are one component of a multi-disciplinary discharge planning process, led by the attending physician.  Recommendations may be updated based on patient status, additional functional criteria and insurance authorization.  Follow Up Recommendations  SNF;Home health PT;Supervision/Assistance - 24 hour     Equipment Recommendations  None recommended by PT    Recommendations for Other Services       Precautions / Restrictions Precautions Precautions: Fall Precaution Comments: patient is refusing collar     Mobility  Bed Mobility Overal bed mobility: Needs Assistance Bed Mobility: Sidelying to Sit   Sidelying to sit: Mod assist     Sit to sidelying: Min assist General bed mobility comments: up to EOB after much assist for arousal, patient attempting to lie back down then assisted back to supine, back to bed prior to ending session due to poor positioning in chair without c-collar    Transfers Overall transfer level: Needs assistance Equipment used: Rolling walker (2 wheeled);None Transfers: Sit  to/from [facility] Sit to Stand: Mod assist;+2 safety/equipment Stand pivot transfers: Mod assist;+2 safety/equipment       General transfer comment: up to chair with RW and pt stating she would fall, attempted to don prosthesis, but pt not helping and felt risk for falling if she relies on it and it is ill fitting, so removed and used RW to chair, then pivot without device back to bed  Ambulation/Gait                 Stairs             Wheelchair Mobility    Modified Rankin (Stroke Patients Only)       Balance Overall balance assessment: Needs assistance Sitting-balance support: Feet supported Sitting balance-Leahy Scale: Fair Sitting balance - Comments: poor posture at EOB but able to sit unsupported until attempting to return to supine   Standing balance support: Bilateral upper extremity supported Standing balance-Leahy Scale: Poor Standing balance comment: UE support in standing without prosthesis                            Cognition Arousal/Alertness: Lethargic Behavior During Therapy: Flat affect Overall Cognitive Status: Difficult to assess                                        Exercises      General Comments General comments (skin integrity, edema, etc.): siter in the room,  Pertinent Vitals/Pain Pain Assessment: Faces Faces Pain Scale: No hurt    Home Living                      Prior Function            PT Goals (current goals can now be found in the care plan section) Progress towards PT goals: Not progressing toward goals - comment    Frequency    Min 3X/week      PT Plan Current plan remains appropriate    Co-evaluation              AM-PAC PT "6 Clicks" Mobility   Outcome Measure  Help needed turning from your back to your side while in a flat bed without using bedrails?: None Help needed moving from lying on your back to sitting on the side of a flat  bed without using bedrails?: A Lot Help needed moving to and from a bed to a chair (including a wheelchair)?: A Lot Help needed standing up from a chair using your arms (e.g., wheelchair or bedside chair)?: A Lot Help needed to walk in hospital room?: Total Help needed climbing 3-5 steps with a railing? : Total 6 Click Score: 12    End of Session Equipment Utilized During Treatment: Gait belt Activity Tolerance: Patient limited by fatigue Patient left: in bed;with call bell/phone within reach;with bed alarm set;with nursing/sitter in room   PT Visit Diagnosis: Other abnormalities of gait and mobility (R26.89);Other symptoms and signs involving the nervous system (R29.898)     Time: 1430-1456 PT Time Calculation (min) (ACUTE ONLY): 26 min  Charges:  $Therapeutic Activity: 23-37 mins                     Magda Kiel, PT Acute Rehabilitation Services Pager:[phone] Office:[phone] 03/13/2021    Reginia Naas 03/13/2021, 5:39 PM

## 2021-03-14 DIAGNOSIS — G9340 Encephalopathy, unspecified: Secondary | ICD-10-CM | POA: Diagnosis not present

## 2021-03-14 MED ORDER — QUETIAPINE FUMARATE 25 MG PO TABS: 25.0000 mg | ORAL_TABLET | Freq: Every day | ORAL | 0 refills | Status: DC

## 2021-03-14 MED ORDER — AMLODIPINE BESYLATE 10 MG PO TABS
10.0000 mg | ORAL_TABLET | Freq: Every day | ORAL | 0 refills | Status: DC
Start: 2021-03-15 — End: 2021-03-18

## 2021-03-14 MED ORDER — HYDRALAZINE HCL 25 MG PO TABS: 25.0000 mg | ORAL_TABLET | Freq: Three times a day (TID) | ORAL | 1 refills | Status: DC

## 2021-03-14 NOTE — NC FL2 (Signed)
[place] LEVEL OF CARE SCREENING TOOL     IDENTIFICATION  Patient Name: Alexandra Alexander Birthdate: 1921/10/08 Sex: female Admission Date (Current Location): 03/07/2021  [facility] and [place] Number:  Herbalist and Address:  [facility] [place] [facility]. [facility], [place] [address], [place], [place] [address]      Provider Number: [id number]  Attending Physician Name and Address:  Shawna Clamp, MD  Relative Name and Phone Number:       Current Level of Care: Hospital Recommended Level of Care: [place] Prior Approval Number:    Date Approved/Denied:   PASRR Number: [id number]  Discharge Plan: SNF    Current Diagnoses: Patient Active Problem List   Diagnosis Date Noted   Acute encephalopathy 03/08/2021   Hypertensive urgency 03/08/2021   Bradycardia 03/08/2021   Acute lower UTI 03/08/2021   Anemia 03/08/2021   Thrombocytopenia ([place]) 03/08/2021   Acute cystitis without hematuria    Closed displaced fracture of first cervical vertebra ([place])    AKI (acute kidney injury) ([place]) 07/14/2017   Stage 3 chronic kidney disease ([place]) 07/14/2017   Acute pyelonephritis 07/14/2017   Left ureteral stone 07/14/2017   Elevated troponin 07/14/2017   Hypothyroidism 07/14/2017   Sepsis due to Escherichia coli (E. coli) ([place]) 07/14/2017   E coli bacteremia 07/14/2017   Sepsis ([place]) 07/13/2017   Hypertension 05/03/2017   Mild renal insufficiency 05/03/2017    Orientation RESPIRATION BLADDER Height & Weight     Self  Normal Incontinent, Continent (incontinent at times) Weight:   Height:     BEHAVIORAL SYMPTOMS/MOOD NEUROLOGICAL BOWEL NUTRITION STATUS      Incontinent Diet (carb modified)  AMBULATORY STATUS COMMUNICATION OF NEEDS Skin   Limited Assist Verbally Normal                       Personal Care Assistance Level of Assistance  Bathing, Feeding, Dressing Bathing Assistance: Limited assistance Feeding assistance: Limited  assistance Dressing Assistance: Limited assistance     Functional Limitations Info  Hearing   Hearing Info: Impaired (hard of hearing)      [place]  PT (By licensed PT), OT (By licensed OT)     PT Frequency: 5x/wk OT Frequency: 5x/wk            Contractures Contractures Info: Not present    Additional Factors Info  Code Status, Allergies, Psychotropic Code Status Info: DNR Allergies Info: Codeine, Vancomycin Psychotropic Info: Seroquel 25mg  daily at bed         Current Medications (03/14/2021):  This is the current hospital active medication list Current Facility-Administered Medications  Medication Dose Route Frequency Provider Last Rate Last Admin   acetaminophen (TYLENOL) tablet 650 mg  650 mg Oral Q6H PRN Rise Patience, MD       Or   acetaminophen (TYLENOL) suppository 650 mg  650 mg Rectal Q6H PRN Rise Patience, MD   650 mg at 03/14/21 0308   amLODipine (NORVASC) tablet 10 mg  10 mg Oral Daily Shawna Clamp, MD   10 mg at 03/14/21 1027   fentaNYL (DURAGESIC) 25 MCG/HR 1 patch  1 patch Transdermal Q3 days Rise Patience, MD   1 patch at 03/14/21 0549   heparin injection 5,000 Units  5,000 Units Subcutaneous Q8H Rise Patience, MD   5,000 Units at 03/14/21 1404   hydrALAZINE (APRESOLINE) injection 10 mg  10 mg Intravenous Q4H PRN Rise Patience, MD  hydrALAZINE (APRESOLINE) tablet 25 mg  25 mg Oral Q8H Shawna Clamp, MD   25 mg at 03/14/21 1404   HYDROcodone-acetaminophen (NORCO/VICODIN) 5-325 MG per tablet 1 tablet  1 tablet Oral Daily PRN Rise Patience, MD   1 tablet at 03/13/21 0130   levothyroxine (SYNTHROID) tablet 75 mcg  75 mcg Oral QAC breakfast Rise Patience, MD   75 mcg at 03/11/21 0454   melatonin tablet 3 mg  3 mg Oral QHS PRN Rise Patience, MD   3 mg at 03/13/21 0130   nystatin cream (MYCOSTATIN) 1 application  1 application Topical BID Shawna Clamp, MD   1 application at  98/33/82 1027   omega-3 acid ethyl esters (LOVAZA) capsule 1 g  1 g Oral Daily Rise Patience, MD   1 g at 03/12/21 1106   ondansetron (ZOFRAN) tablet 4 mg  4 mg Oral Q8H PRN Rise Patience, MD       polyethylene glycol (MIRALAX / GLYCOLAX) packet 17 g  17 g Oral Daily Rise Patience, MD   17 g at 03/12/21 1106   QUEtiapine (SEROQUEL) tablet 25 mg  25 mg Oral QHS Shawna Clamp, MD   25 mg at 03/12/21 2229   senna (SENOKOT) tablet 8.6 mg  8.6 mg Oral Daily Rise Patience, MD   8.6 mg at 03/12/21 1106     Discharge Medications: Please see discharge summary for a list of discharge medications.  Relevant Imaging Results:  Relevant Lab Results:   Additional Information SS#: [id number]  Geralynn Ochs, LCSW

## 2021-03-14 NOTE — Progress Notes (Signed)
PROGRESS NOTE    Alexandra Alexander  SAY:[id number] DOB: Sep 17, 1921 DOA: 03/07/2021 PCP: [name], MD   Brief Narrative:  This 87 years old female with PMH significant for hypertension, hypothyroidism, chronic kidney disease stage IIIb, anemia and thrombocytopenia presented to the ED s/p unwitnessed fall at the living facility.  In the ED She is found to be confused and CT head and C-spine and x-ray pelvis were completed.  CT C-spine shows C1 ring fracture.  ER physician has discussed with on-call neurosurgery, who recommended to place patient on cervical collar and follow-up outpatient with neurosurgery. Patient EKG is concerning for second-degree AV block.  Cardiology consulted , recommended to continue telemetry and repeat echocardiogram. Patient has been doing fine.  She completed treatment for UTI.  She has waxing and waning in mental status requiring one-to-one Air cabin crew.  Incidentally patient COVID test was positive.  She is not having any respiratory or GI symptoms.  Repeat COVID test was negative.  Patient is not on isolation precautions.  Assessment & Plan:   Principal Problem:   Acute encephalopathy Active Problems:   Stage 3 chronic kidney disease (HCC)   Hypothyroidism   Hypertensive urgency   Bradycardia   Acute lower UTI   Anemia   Thrombocytopenia (HCC)  Acute encephalopathy: > Improved. This could be secondary to post concussion or UTI. CT head negative for any acute abnormality.  UA consistent with UTI. Completed ceftriaxone for 3 days. Daughter reported whenever she gets UTIs,  she becomes confused. Patient has on and off waxing and waning in mental status, requiring one-to-one Air cabin crew.. She becomes agitated and restless in the night, placed on soft restraints at night. Started Seroquel 25 mg at bedtime on 02/10/21. She is alert and oriented,  back to baseline mental status. Discontinue one-to-one Air cabin crew.  Bradycardia : > Improved. Patient was  found to have bradycardia with EKG showed second-degree AV block type I Cardiology consulted, EKG in the morning showed normal sinus rhythm with sinus arrhythmia. Cardiology recommended to hold Bystolic and obtain echocardiogram. Heart rate remains normal.  Echocardiogram shows EF 60 to 65%..  No regional wall motion abnormalities. Cardiology signed off.  Bystolic discontinued.  Hypertensive urgency: > Improved Continue amlodipine and Hydralazine. BP controlled.    C1 cervical fracture: Neurosurgery recommended cervical collar and outpatient neurosurgery follow-up. Patient reports feeling uncomfortable wearing c-collar, she has been refusing.  Hypothyroidism: Continue Synthroid.  UTI:  Completed ceftriaxone for 3 days.  AKI on CKD stage IIIb: Serum creatinine remained at baseline, spiked to 1.52  on 03/12/21 Losartan discontinued, given IV fluid bolus. Recheck BMP shows improvement in serum creatinine 1.3  Chronic anemia and thrombocytopenia: Seems stable.   COVID- Patient does not have respiratory or GI symptoms. She is found to have incidental COVID+, done for ALF requirement. Repeat COVID test was negative. First COVID test on 9/27 negative. Airborne precautions removed.   DVT prophylaxis: Heparin Code Status: DNR Family Communication: Spoke with daughter on the phone. Disposition Plan:   Status is: Inpatient  Remains inpatient appropriate because:Inpatient level of care appropriate due to severity of illness  Dispo: The patient is from: Home              Anticipated d/c is to: SNF.              Patient currently is not medically stable to d/c.   Difficult to place patient No  Consultants:  Cardiology  Procedures: Echocardiogram.  Antimicrobials: None.  Subjective: Patient was  seen and examined at bedside.  Overnight events noted.   Patient is very alert, oriented x 2 ,  following commands.  She is hard of hearing. She does not have any respiratory or GI  symptoms, repeat COVID test negative.   Objective: Vitals:   03/14/21 0022 03/14/21 0338 03/14/21 0800 03/14/21 0805  BP: 116/69 133/61 (!) 182/80   Pulse: (!) 52 66 (!) 50 (!) 55  Resp: 16 19 20    Temp: (!) 97.4 F (36.3 C) 98.6 F (37 C) 98.2 F (36.8 C)   TempSrc: Oral Axillary Oral   SpO2: 95% 94% 98% 96%    Intake/Output Summary (Last 24 hours) at 03/14/2021 1127 Last data filed at 03/13/2021 1400 Gross per 24 hour  Intake 40 ml  Output --  Net 40 ml   There were no vitals filed for this visit.  Examination:  General exam: Appears comfortable, deconditioned, not in any acute distress.   Respiratory system: Clear to auscultation bilaterally, RR 15  Cardiovascular system: S1-S2 heard, regular rate and rhythm, no murmur. Gastrointestinal system: Soft, nontender, nondistended, BS+. Central nervous system: Alert, Awake, oriented x 2, following commands.  No focal neurological deficits Extremities: No edema, no cyanosis, no clubbing. Skin: No rashes, lesions or ulcers Psychiatry: Mood & affect appropriate.     Data Reviewed: I have personally reviewed following labs and imaging studies  CBC: Recent Labs  Lab 03/08/21 0353 03/09/21 0706 03/12/21 0351  WBC 7.3 6.8 6.2  NEUTROABS 3.4  --   --   HGB 11.6* 11.5* 11.6*  HCT 36.3 33.6* 34.1*  MCV 96.3 91.6 92.9  PLT 138* 136* 010*   Basic Metabolic Panel: Recent Labs  Lab 03/08/21 0353 03/09/21 0706 03/10/21 0140 03/12/21 0351 03/13/21 0511  NA 142 140 139 140 140  K 4.3 3.6 3.9 3.9 3.9  CL 111 108 108 107 105  CO2 25 25 24 26 24   GLUCOSE 102* 89 83 109* 140*  BUN 26* 18 20 25* 22  CREATININE 1.33* 1.04* 1.28* 1.52* 1.31*  CALCIUM 9.1 8.8* 8.7* 9.3 9.5  MG  --  1.9  --   --   --   PHOS  --  2.8  --   --   --    GFR: CrCl cannot be calculated (Unknown ideal weight.). Liver Function Tests: Recent Labs  Lab 03/08/21 0353 03/09/21 0706  AST 21 24  ALT 13 15  ALKPHOS 51 52  BILITOT 0.4 0.7  PROT  6.1* 5.8*  ALBUMIN 3.4* 3.4*   No results for input(s): LIPASE, AMYLASE in the last 168 hours. No results for input(s): AMMONIA in the last 168 hours. Coagulation Profile: No results for input(s): INR, PROTIME in the last 168 hours. Cardiac Enzymes: No results for input(s): CKTOTAL, CKMB, CKMBINDEX, TROPONINI in the last 168 hours. BNP (last 3 results) No results for input(s): PROBNP in the last 8760 hours. HbA1C: No results for input(s): HGBA1C in the last 72 hours. CBG: No results for input(s): GLUCAP in the last 168 hours. Lipid Profile: No results for input(s): CHOL, HDL, LDLCALC, TRIG, CHOLHDL, LDLDIRECT in the last 72 hours. Thyroid Function Tests: No results for input(s): TSH, T4TOTAL, FREET4, T3FREE, THYROIDAB in the last 72 hours.  Anemia Panel: No results for input(s): VITAMINB12, FOLATE, FERRITIN, TIBC, IRON, RETICCTPCT in the last 72 hours. Sepsis Labs: No results for input(s): PROCALCITON, LATICACIDVEN in the last 168 hours.  Recent Results (from the past 240 hour(s))  Resp Panel by RT-PCR (Flu A&B,  Covid) Nasopharyngeal Swab     Status: None   Collection Time: 03/08/21  1:40 AM   Specimen: Nasopharyngeal Swab; Nasopharyngeal(NP) swabs in vial transport medium  Result Value Ref Range Status   SARS Coronavirus 2 by RT PCR NEGATIVE NEGATIVE Final    Comment: (NOTE) SARS-CoV-2 target nucleic acids are NOT DETECTED.  The SARS-CoV-2 RNA is generally detectable in upper respiratory specimens during the acute phase of infection. The lowest concentration of SARS-CoV-2 viral copies this assay can detect is 138 copies/mL. A negative result does not preclude SARS-Cov-2 infection and should not be used as the sole basis for treatment or other patient management decisions. A negative result may occur with  improper specimen collection/handling, submission of specimen other than nasopharyngeal swab, presence of viral mutation(s) within the areas targeted by this assay, and  inadequate number of viral copies(<138 copies/mL). A negative result must be combined with clinical observations, patient history, and epidemiological information. The expected result is Negative.  Fact Sheet for Patients:  EntrepreneurPulse.com.au  Fact Sheet for Healthcare Providers:  IncredibleEmployment.be  This test is no t yet approved or cleared by the [place] FDA and  has been authorized for detection and/or diagnosis of SARS-CoV-2 by FDA under an Emergency Use Authorization (EUA). This EUA will remain  in effect (meaning this test can be used) for the duration of the COVID-19 declaration under Section 564(b)(1) of the Act, 21 U.S.C.section 360bbb-3(b)(1), unless the authorization is terminated  or revoked sooner.       Influenza A by PCR NEGATIVE NEGATIVE Final   Influenza B by PCR NEGATIVE NEGATIVE Final    Comment: (NOTE) The Xpert Xpress SARS-CoV-2/FLU/RSV plus assay is intended as an aid in the diagnosis of influenza from Nasopharyngeal swab specimens and should not be used as a sole basis for treatment. Nasal washings and aspirates are unacceptable for Xpert Xpress SARS-CoV-2/FLU/RSV testing.  Fact Sheet for Patients: EntrepreneurPulse.com.au  Fact Sheet for Healthcare Providers: IncredibleEmployment.be  This test is not yet approved or cleared by the [place] FDA and has been authorized for detection and/or diagnosis of SARS-CoV-2 by FDA under an Emergency Use Authorization (EUA). This EUA will remain in effect (meaning this test can be used) for the duration of the COVID-19 declaration under Section 564(b)(1) of the Act, 21 U.S.C. section 360bbb-3(b)(1), unless the authorization is terminated or revoked.  Performed at [facility], [place] [address] [place], [place] [address]   Resp Panel by RT-PCR (Flu A&B, Covid) Nasopharyngeal Swab     Status: Abnormal   Collection  Time: 03/12/21  3:52 PM   Specimen: Nasopharyngeal Swab; Nasopharyngeal(NP) swabs in vial transport medium  Result Value Ref Range Status   SARS Coronavirus 2 by RT PCR POSITIVE (A) NEGATIVE Final    Comment: RESULT CALLED TO, READ BACK BY AND VERIFIED WITH: Jeani Hawking RN [id number] 03/12/21 A BROWNING (NOTE) SARS-CoV-2 target nucleic acids are DETECTED.  The SARS-CoV-2 RNA is generally detectable in upper respiratory specimens during the acute phase of infection. Positive results are indicative of the presence of the identified virus, but do not rule out bacterial infection or co-infection with other pathogens not detected by the test. Clinical correlation with patient history and other diagnostic information is necessary to determine patient infection status. The expected result is Negative.  Fact Sheet for Patients: EntrepreneurPulse.com.au  Fact Sheet for Healthcare Providers: IncredibleEmployment.be  This test is not yet approved or cleared by the [place] FDA and  has been authorized for detection  and/or diagnosis of SARS-CoV-2 by FDA under an Emergency Use Authorization (EUA).  This EUA will remain in effect (meaning this test can  be used) for the duration of  the COVID-19 declaration under Section 564(b)(1) of the Act, 21 U.S.C. section 360bbb-3(b)(1), unless the authorization is terminated or revoked sooner.     Influenza A by PCR NEGATIVE NEGATIVE Final   Influenza B by PCR NEGATIVE NEGATIVE Final    Comment: (NOTE) The Xpert Xpress SARS-CoV-2/FLU/RSV plus assay is intended as an aid in the diagnosis of influenza from Nasopharyngeal swab specimens and should not be used as a sole basis for treatment. Nasal washings and aspirates are unacceptable for Xpert Xpress SARS-CoV-2/FLU/RSV testing.  Fact Sheet for Patients: EntrepreneurPulse.com.au  Fact Sheet for Healthcare  Providers: IncredibleEmployment.be  This test is not yet approved or cleared by the [place] FDA and has been authorized for detection and/or diagnosis of SARS-CoV-2 by FDA under an Emergency Use Authorization (EUA). This EUA will remain in effect (meaning this test can be used) for the duration of the COVID-19 declaration under Section 564(b)(1) of the Act, 21 U.S.C. section 360bbb-3(b)(1), unless the authorization is terminated or revoked.  Performed at [facility], [place] [address], [place], [place] [address]   Resp Panel by RT-PCR (Flu A&B, Covid) Nasopharyngeal Swab     Status: None   Collection Time: 03/13/21  8:04 AM   Specimen: Nasopharyngeal Swab; Nasopharyngeal(NP) swabs in vial transport medium  Result Value Ref Range Status   SARS Coronavirus 2 by RT PCR NEGATIVE NEGATIVE Final    Comment: (NOTE) SARS-CoV-2 target nucleic acids are NOT DETECTED.  The SARS-CoV-2 RNA is generally detectable in upper respiratory specimens during the acute phase of infection. The lowest concentration of SARS-CoV-2 viral copies this assay can detect is 138 copies/mL. A negative result does not preclude SARS-Cov-2 infection and should not be used as the sole basis for treatment or other patient management decisions. A negative result may occur with  improper specimen collection/handling, submission of specimen other than nasopharyngeal swab, presence of viral mutation(s) within the areas targeted by this assay, and inadequate number of viral copies(<138 copies/mL). A negative result must be combined with clinical observations, patient history, and epidemiological information. The expected result is Negative.  Fact Sheet for Patients:  EntrepreneurPulse.com.au  Fact Sheet for Healthcare Providers:  IncredibleEmployment.be  This test is no t yet approved or cleared by the [place] FDA and  has been authorized for  detection and/or diagnosis of SARS-CoV-2 by FDA under an Emergency Use Authorization (EUA). This EUA will remain  in effect (meaning this test can be used) for the duration of the COVID-19 declaration under Section 564(b)(1) of the Act, 21 U.S.C.section 360bbb-3(b)(1), unless the authorization is terminated  or revoked sooner.       Influenza A by PCR NEGATIVE NEGATIVE Final   Influenza B by PCR NEGATIVE NEGATIVE Final    Comment: (NOTE) The Xpert Xpress SARS-CoV-2/FLU/RSV plus assay is intended as an aid in the diagnosis of influenza from Nasopharyngeal swab specimens and should not be used as a sole basis for treatment. Nasal washings and aspirates are unacceptable for Xpert Xpress SARS-CoV-2/FLU/RSV testing.  Fact Sheet for Patients: EntrepreneurPulse.com.au  Fact Sheet for Healthcare Providers: IncredibleEmployment.be  This test is not yet approved or cleared by the [place] FDA and has been authorized for detection and/or diagnosis of SARS-CoV-2 by FDA under an Emergency Use Authorization (EUA). This EUA will remain in effect (meaning this test  can be used) for the duration of the COVID-19 declaration under Section 564(b)(1) of the Act, 21 U.S.C. section 360bbb-3(b)(1), unless the authorization is terminated or revoked.  Performed at [facility], [place] [address], [place], [place] [address]     Radiology Studies: No results found.  Scheduled Meds:  amLODipine  10 mg Oral Daily   fentaNYL  1 patch Transdermal Q3 days   heparin  5,000 Units Subcutaneous Q8H   hydrALAZINE  25 mg Oral Q8H   levothyroxine  75 mcg Oral QAC breakfast   nystatin cream  1 application Topical BID   omega-3 acid ethyl esters  1 g Oral Daily   polyethylene glycol  17 g Oral Daily   QUEtiapine  25 mg Oral QHS   senna  8.6 mg Oral Daily   Continuous Infusions:   LOS: 5 days    Time spent: 25 mins  Shawna Clamp, MD [facility]   If 7PM-7AM, please contact night-coverage

## 2021-03-14 NOTE — Progress Notes (Signed)
Physical Therapy Treatment Patient Details Name: Alexandra Alexander MRN: [id number] DOB: 1921-12-30 Today's Date: 03/14/2021   History of Present Illness Pt is a 87 y/o female who presented to the ED s/p unwitnessed fall at her assisted living facility. In the ED She is found to be confused and CT head and C-spine and x-ray pelvis were completed.  CT C-spine shows C1 ring fracture. PMH significant for hypertension, hypothyroidism, chronic kidney disease stage IIIb, prior L BKA (prosthesis in room).    PT Comments    Pt received in chair, sleeping but easily awoken and agreeable to therapy session. Pt pleasant, with improved alertness and good participation this date, but remains confused and with very poor safety awareness and difficulty coordinating steps with assistive device this date. Pt needing minA for transfers and mod to maxA for gait with RW. Emphasis on safe hand placement with transfers, safe use of RW, step sequencing, and upright posture. Pt continues to benefit from PT services to progress toward functional mobility goals. Disposition/DME clarified per discussion with supervising PT Lynnell Jude and pt/family agreeable to SNF per medical team.  Recommendations for follow up therapy are one component of a multi-disciplinary discharge planning process, led by the attending physician.  Recommendations may be updated based on patient status, additional functional criteria and insurance authorization.  Follow Up Recommendations  SNF;Supervision/Assistance - 24 hour     Equipment Recommendations  Rolling walker with 5" wheels (youth sized, may also consider 3 in 1)    Recommendations for Other Services       Precautions / Restrictions Precautions Precautions: Fall Precaution Booklet Issued: Yes (comment) Precaution Comments: patient is refusing collar Required Braces or Orthoses: Cervical Brace;Other Brace Cervical Brace: Hard collar (pt refusing to don) Other Brace: LLE  prosthesis Restrictions Weight Bearing Restrictions: No     Mobility  Bed Mobility               General bed mobility comments: received in recliner/remained up in chair    Transfers Overall transfer level: Needs assistance Equipment used: Rolling walker (2 wheeled);None Transfers: Sit to/from Stand Sit to Stand: Min assist Stand pivot transfers: Mod assist       General transfer comment: decreased safety and needing increased cueing ans balance support to prevent from falling. crouched posture/downward gaze and posterior bias  Ambulation/Gait Ambulation/Gait assistance: Mod assist;Max assist Gait Distance (Feet): 25 Feet (40ft x2, 42ft) Assistive device: Rolling walker (2 wheeled) Gait Pattern/deviations: Step-through pattern;Leaning posteriorly;Drifts right/left;Trunk flexed;Narrow base of support Gait velocity: decreased Gait velocity interpretation: <1.8 ft/sec, indicate of risk for recurrent falls General Gait Details: pt with very long L step length and narrow BOS, putting her at risk for scissoring/pt at times crossing midline when stepping and consistent flexed trunk/downward gaze causing posterior lean/overally instability. Pt needing max verbal cues, consistent assist to manage RW and mod/maxA trunk support for safety      Balance Overall balance assessment: Needs assistance Sitting-balance support: Feet supported Sitting balance-Leahy Scale: Fair Sitting balance - Comments: able to sit unsupported in chair but poor posture Postural control: Posterior lean;Right lateral lean Standing balance support: Bilateral upper extremity supported Standing balance-Leahy Scale: Poor Standing balance comment: needing external support varying mod/maxA and RW; more unsteady with steps; pt able to stand at sink to wash hands with B forearm support and min guard          Cognition Arousal/Alertness: Awake/alert Behavior During Therapy: Impulsive Overall Cognitive  Status: Impaired/Different from baseline Area of Impairment: Orientation;Attention;Memory;Following  commands;Safety/judgement;Problem solving                 Orientation Level: Disoriented to;Place;Time;Situation Current Attention Level: Sustained Memory: Decreased short-term memory;Decreased recall of precautions Following Commands: Follows one step commands with increased time Safety/Judgement: Decreased awareness of safety;Decreased awareness of deficits Awareness: Intellectual Problem Solving: Requires verbal cues;Requires tactile cues;Difficulty sequencing General Comments: pleasant demeanor this date, very hard of hearing; pt with poor carryover of safety/postural cues throughout mobility tasks. pt given demo for improved technique but again unable to carryover; pt unsafe with RW use and remains very high fall risk; notably decreased short term memory      Exercises Other Exercises Other Exercises: seated BLE AROM: LAQ, hip flexion, ankle pumps x10 reps ea Other Exercises: standing BLE AROM: hip flexion x10 reps    General Comments General comments (skin integrity, edema, etc.): notable bruising on top of skull, some bruising to anterior R shin (pt had fall prior to admit)      Pertinent Vitals/Pain Pain Assessment: Faces Faces Pain Scale: Hurts a little bit Pain Location: L knee above prosthetic, "a little sore" Pain Descriptors / Indicators: Sore Pain Intervention(s): Limited activity within patient's tolerance;Monitored during session;Repositioned     PT Goals (current goals can now be found in the care plan section) Acute Rehab PT Goals Patient Stated Goal: Whatever you want me to do PT Goal Formulation: With patient/family Time For Goal Achievement: 03/24/21 Progress towards PT goals: Progressing toward goals    Frequency    Min 3X/week      PT Plan Discharge plan needs to be updated;Equipment recommendations need to be updated       AM-PAC PT "6  Clicks" Mobility   Outcome Measure  Help needed turning from your back to your side while in a flat bed without using bedrails?: A Little Help needed moving from lying on your back to sitting on the side of a flat bed without using bedrails?: A Lot Help needed moving to and from a bed to a chair (including a wheelchair)?: A Lot Help needed standing up from a chair using your arms (e.g., wheelchair or bedside chair)?: A Little Help needed to walk in hospital room?: A Lot Help needed climbing 3-5 steps with a railing? : Total 6 Click Score: 13    End of Session Equipment Utilized During Treatment: Gait belt (pt refusing HCC; using LLE prosthesis) Activity Tolerance: Patient tolerated treatment well Patient left: in chair;with call bell/phone within reach;with nursing/sitter in room (sitter [place] in room so deferred chair alarm but sitter notified to have one placed if they DC sitter order) Nurse Communication: Mobility status PT Visit Diagnosis: Other abnormalities of gait and mobility (R26.89);Other symptoms and signs involving the nervous system (R29.898)     Time: 3664-4034 PT Time Calculation (min) (ACUTE ONLY): 23 min  Charges:  $Gait Training: 8-22 mins $Therapeutic Exercise: 8-22 mins                     Namiah Dunnavant P., PTA Acute Rehabilitation Services Pager: [phone] Office: [place] 03/14/2021, 12:23 PM

## 2021-03-14 NOTE — Discharge Instructions (Signed)
Advised to follow-up with primary care physician in 1 week. Advised to take amlodipine and hydralazine as prescribed. Bystolic has been discontinued because of persistent low heart rate.

## 2021-03-14 NOTE — Progress Notes (Signed)
1:1 sitter d/c'd

## 2021-03-14 NOTE — TOC Initial Note (Signed)
Transition of Care [facility]) - Initial/Assessment Note    Patient Details  Name: Alexandra Alexander MRN: [id number] Date of Birth: [birth year]  Transition of Care [facility]) CM/SW Contact:    Geralynn Ochs, LCSW Phone Number: 03/14/2021, 3:00 PM  Clinical Narrative:         CSW alerted by RN that daughter is concerned about the patient returning to ALF, requesting SNF. CSW spoke with daughter, Gregary Signs, who also got the son, Patrick Jupiter, on the phone to conference call. Both children discussed how the patient was independent with ambulation prior to hospitalization, and they know that she won't get the assist needed at [facility] if she can't ambulate on her own without assist. CSW discussed SNF placement and both children are in agreement, requesting [facility] if possible. CSW discussed patient's ability with both PT and OT assigned and confirmed recommendation for SNF placement. CSW has sent referral out, awaiting bed offers.           Expected Discharge Plan: Skilled Nursing Facility Barriers to Discharge: Continued Medical Work up, Requiring sitter/restraints   Patient Goals and CMS Choice Patient states their goals for this hospitalization and ongoing recovery are:: patient unable to participate in goal setting, disoriented CMS Medicare.gov Compare Post Acute Care list provided to:: Patient Represenative (must comment) Choice offered to / list presented to : Adult Children  Expected Discharge Plan and Services Expected Discharge Plan: [place] Choice: [place] arrangements for the past 2 months: [place] Expected Discharge Date: 03/14/21                                    Prior Living Arrangements/Services Living arrangements for the past 2 months: [place] Lives with:: Facility Resident Patient language and need for interpreter reviewed:: No Do you feel safe going back to the place where you  live?: Yes      Need for Family Participation in Patient Care: Yes (Comment) Care giver support system in place?: No (comment)   Criminal Activity/Legal Involvement Pertinent to Current Situation/Hospitalization: No - Comment as needed  Activities of Daily Living      Permission Sought/Granted Permission sought to share information with : Facility Sport and exercise psychologist, Family Supports Permission granted to share information with : Yes, Verbal Permission Granted  Share Information with NAME: Henry Russel  Permission granted to share info w AGENCY: SNF  Permission granted to share info w Relationship: Children     Emotional Assessment   Attitude/Demeanor/Rapport: Unable to Assess Affect (typically observed): Unable to Assess Orientation: : Oriented to Self Alcohol / Substance Use: Not Applicable Psych Involvement: No (comment)  Admission diagnosis:  Acute cystitis without hematuria [N30.00] Acute encephalopathy [G93.40] Fall, initial encounter [W19.XXXA] Altered mental status, unspecified altered mental status type [R41.82] Closed displaced fracture of first cervical vertebra, unspecified fracture morphology, initial encounter ([place]) [S12.000A] Patient Active Problem List   Diagnosis Date Noted   Acute encephalopathy 03/08/2021   Hypertensive urgency 03/08/2021   Bradycardia 03/08/2021   Acute lower UTI 03/08/2021   Anemia 03/08/2021   Thrombocytopenia ([place]) 03/08/2021   Acute cystitis without hematuria    Closed displaced fracture of first cervical vertebra (HCC)    AKI (acute kidney injury) ([place]) 07/14/2017   Stage 3 chronic kidney disease ([place]) 07/14/2017   Acute pyelonephritis 07/14/2017   Left ureteral stone 07/14/2017   Elevated troponin 07/14/2017  Hypothyroidism 07/14/2017   Sepsis due to Escherichia coli (E. coli) ([place]) 07/14/2017   E coli bacteremia 07/14/2017   Sepsis ([place]) 07/13/2017   Hypertension 05/03/2017   Mild renal insufficiency 05/03/2017    PCP:  [name], MD Pharmacy:   [id number] - [place], [place]. AT [address] [place] [address] Phone: [phone] Fax: [phone]     Social Determinants of Health (SDOH) Interventions    Readmission Risk Interventions No flowsheet data found.

## 2021-03-14 NOTE — Plan of Care (Signed)
Pt is alert oriented x 3, pt confused. Pt has sitter present at bedside. Pt's daughter was present at time medications were given, pt cooperated and took medications with out complications.     Problem: Education: Goal: Knowledge of General Education information will improve Description: Including pain rating scale, medication(s)/side effects and non-pharmacologic comfort measures Outcome: Progressing   Problem: Health Behavior/Discharge Planning: Goal: Ability to manage health-related needs will improve Outcome: Progressing   Problem: Clinical Measurements: Goal: Ability to maintain clinical measurements within normal limits will improve Outcome: Progressing Goal: Will remain free from infection Outcome: Progressing Goal: Diagnostic test results will improve Outcome: Progressing Goal: Respiratory complications will improve Outcome: Progressing Goal: Cardiovascular complication will be avoided Outcome: Progressing   Problem: Activity: Goal: Risk for activity intolerance will decrease Outcome: Progressing   Problem: Nutrition: Goal: Adequate nutrition will be maintained Outcome: Progressing   Problem: Coping: Goal: Level of anxiety will decrease Outcome: Progressing   Problem: Elimination: Goal: Will not experience complications related to bowel motility Outcome: Progressing Goal: Will not experience complications related to urinary retention Outcome: Progressing   Problem: Pain Managment: Goal: General experience of comfort will improve Outcome: Progressing   Problem: Safety: Goal: Ability to remain free from injury will improve Outcome: Progressing   Problem: Skin Integrity: Goal: Risk for impaired skin integrity will decrease Outcome: Progressing

## 2021-03-14 NOTE — Progress Notes (Signed)
Occupational Therapy Treatment Patient Details Name: Alexandra Alexander MRN: [id number] DOB: July 26, 1921 Today's Date: 03/14/2021   History of present illness Pt is a 87 y/o female who presented to the ED s/p unwitnessed fall at her assisted living facility. In the ED She is found to be confused and CT head and C-spine and x-ray pelvis were completed.  CT C-spine shows C1 ring fracture. PMH significant for hypertension, hypothyroidism, chronic kidney disease stage IIIb, prior R BKA (prosthesis in room).   OT comments  Patient presenting much more alert with increased willingness and ability to participate.  Patient able to donn L prothesis with Min A, walk to sink with RW and Min A, and perform standing grooming task with [employer] and cues for sequencing.  Deficits impacting independence are listed below, continued confusion is her biggest barrier impacting safety and increasing her fall risk.  OT will continue to follow in the acute setting, however, based on the patient's current 24 hour need for care, post acute recommendation has been updated to SNF for continued rehab prior to retuning to ALF.  The ALF cannot provide the necessary level of support needed.     Recommendations for follow up therapy are one component of a multi-disciplinary discharge planning process, led by the attending physician.  Recommendations may be updated based on patient status, additional functional criteria and insurance authorization.    Follow Up Recommendations  SNF    Equipment Recommendations  None recommended by OT    Recommendations for Other Services      Precautions / Restrictions Precautions Precautions: Fall Precaution Comments: patient is refusing collar Required Braces or Orthoses: Cervical Brace Restrictions Weight Bearing Restrictions: No       Mobility Bed Mobility               General bed mobility comments: sitting on BSC Patient Response:  Anxious;Restless;Impulsive  Transfers Overall transfer level: Needs assistance Equipment used: Rolling walker (2 wheeled);None Transfers: Sit to/from [employer] to Stand: Min assist Stand pivot transfers: Mod assist       General transfer comment: decreased safety and needing increased cueing ans balance suport to prevent from falling.    Balance Overall balance assessment: Needs assistance Sitting-balance support: Feet supported Sitting balance-Leahy Scale: Fair     Standing balance support: Bilateral upper extremity supported Standing balance-Leahy Scale: Poor Standing balance comment: needing external support and [place].                           ADL either performed or assessed with clinical judgement   ADL       Grooming: Wash/dry hands;Wash/dry face;Min guard;Standing;Cueing for sequencing;Cueing for safety                                                         Cognition Arousal/Alertness: Awake/alert Behavior During Therapy: Impulsive Overall Cognitive Status: Impaired/Different from baseline Area of Impairment: Orientation;Attention;Memory;Following commands;Safety/judgement;Problem solving                 Orientation Level: Disoriented to;Place;Time;Situation Current Attention Level: Sustained Memory: Decreased short-term memory Following Commands: Follows one step commands with increased time Safety/Judgement: Decreased awareness of safety;Decreased awareness of deficits   Problem Solving: Requires verbal cues;Requires tactile cues  Pertinent Vitals/ Pain       Pain Assessment: No/denies pain Pain Intervention(s): Monitored during session                                                          Frequency  Min 2X/week        Progress Toward Goals  OT Goals(current goals can now be found in the care plan section)  Progress  towards OT goals: Progressing toward goals  Acute Rehab OT Goals Patient Stated Goal: Whatever you want me to do OT Goal Formulation: Patient unable to participate in goal setting Time For Goal Achievement: 03/24/21 Potential to Achieve Goals: Good  Plan Discharge plan needs to be updated    Co-evaluation                 AM-PAC OT "6 Clicks" Daily Activity     Outcome Measure   Help from another person eating meals?: A Little Help from another person taking care of personal grooming?: A Little Help from another person toileting, which includes using toliet, bedpan, or urinal?: A Lot Help from another person bathing (including washing, rinsing, drying)?: A Lot Help from another person to put on and taking off regular upper body clothing?: A Little Help from another person to put on and taking off regular lower body clothing?: A Lot 6 Click Score: 15    End of Session Equipment Utilized During Treatment: Gait belt;Rolling walker  OT Visit Diagnosis: Unsteadiness on feet (R26.81);History of falling (Z91.81);Other symptoms and signs involving cognitive function;Pain;Other abnormalities of gait and mobility (R26.89)   Activity Tolerance Patient tolerated treatment well   Patient Left in chair;with call bell/phone within reach;with chair alarm set;with nursing/sitter in room   Nurse Communication Mobility status        Time: 1030-1050 OT Time Calculation (min): 20 min  Charges: OT General Charges $OT Visit: 1 Visit OT Treatments $Self Care/Home Management : 8-22 mins  03/14/2021  RP, OTR/L  Acute Rehabilitation Services  Office:  [phone]   Metta Clines 03/14/2021, 11:47 AM

## 2021-03-15 ENCOUNTER — Other Ambulatory Visit: Payer: Self-pay

## 2021-03-15 DIAGNOSIS — G9340 Encephalopathy, unspecified: Secondary | ICD-10-CM | POA: Diagnosis not present

## 2021-03-15 LAB — BASIC METABOLIC PANEL
Anion gap: 7 (ref 5–15)
BUN: 17 mg/dL (ref 8–23)
CO2: 27 mmol/L (ref 22–32)
Calcium: 9 mg/dL (ref 8.9–10.3)
Chloride: 108 mmol/L (ref 98–111)
Creatinine, Ser: 1.21 mg/dL — ABNORMAL HIGH (ref 0.44–1.00)
GFR, Estimated: 41 mL/min — ABNORMAL LOW (ref >60)
Glucose, Bld: 95 mg/dL (ref 70–99)
Potassium: 3.9 mmol/L (ref 3.5–5.1)
Sodium: 142 mmol/L (ref 135–145)

## 2021-03-15 NOTE — Progress Notes (Signed)
PROGRESS NOTE    Alexandra Alexander  EVO:[id number] DOB: [birth year] DOA: 03/07/2021 PCP: [name], MD   Brief Narrative:  This 87 years old female with PMH significant for hypertension, hypothyroidism, chronic kidney disease stage IIIb, anemia and thrombocytopenia presented to the ED s/p unwitnessed fall at the living facility.  In the ED She is found to be confused and CT head and C-spine and x-ray pelvis were completed.  CT C-spine shows C1 ring fracture.  ER physician has discussed with on-call neurosurgery, who recommended to place patient on cervical collar and follow-up outpatient with neurosurgery. Patient EKG is concerning for second-degree AV block.  Cardiology consulted , recommended to continue telemetry and repeat echocardiogram. Patient has been doing fine. She completed treatment for UTI.  She has waxing and waning in mental status requiring one-to-one Air cabin crew.  Incidentally patient COVID test was positive. She is not having any respiratory or GI symptoms.  Repeat COVID test was negative.  Patient is not on isolation precautions.  Assessment & Plan:   Principal Problem:   Acute encephalopathy Active Problems:   Stage 3 chronic kidney disease (HCC)   Hypothyroidism   Hypertensive urgency   Bradycardia   Acute lower UTI   Anemia   Thrombocytopenia (HCC)  Acute encephalopathy: > Improved. This could be secondary to post concussion or UTI. CT head negative for any acute abnormality.  UA consistent with UTI. Completed ceftriaxone for 3 days. Daughter reported whenever she gets UTIs,  she becomes confused. Patient has on and off waxing and waning in mental status, requiring one-to-one Air cabin crew.. She becomes agitated and restless in the night, placed on soft restraints at night. Continue Seroquel 25 mg at bedtime. She is alert and oriented,  back to baseline mental status. Discontinue one-to-one Air cabin crew.  Bradycardia : > Improved. Patient was found to  have bradycardia with EKG showed second-degree AV block type I Cardiology consulted, EKG in the morning showed normal sinus rhythm with sinus arrhythmia. Cardiology recommended to hold Bystolic and obtain echocardiogram. Heart rate remains normal.  Echocardiogram shows EF 60 to 65%..  No regional wall motion abnormalities. Cardiology signed off.  Bystolic discontinued.  Hypertensive urgency: > Improved Continue amlodipine and Hydralazine. BP controlled.    C1 cervical fracture: Neurosurgery recommended cervical collar and outpatient neurosurgery follow-up. Patient reports feeling uncomfortable wearing c-collar, she has been refusing.  Hypothyroidism: Continue Synthroid.  UTI:  Completed ceftriaxone for 3 days.  AKI on CKD stage IIIb: Serum creatinine remained at baseline, spiked to 1.52  on 03/12/21 Losartan discontinued, given IV fluid bolus. Recheck BMP shows improvement in serum creatinine 1.3  Chronic anemia and thrombocytopenia: Seems stable.   COVID- Patient does not have respiratory or GI symptoms. She is found to have incidental COVID+, done for ALF requirement. Repeat COVID test was negative. First COVID test on 9/27 negative. Airborne precautions removed.   DVT prophylaxis: Heparin Code Status: DNR Family Communication: Spoke with daughter on the phone. Disposition Plan:   Status is: Inpatient  Remains inpatient appropriate because:Inpatient level of care appropriate due to severity of illness  Dispo: The patient is from: Home              Anticipated d/c is to: SNF.              Patient currently is not medically stable to d/c.   Difficult to place patient No  Consultants:  Cardiology  Procedures: Echocardiogram.  Antimicrobials: None.  Subjective: Patient was seen and examined at  bedside.  Overnight events noted.   Patient is alert, oriented x2, following commands.  She is hard of hearing. She does not have any respiratory or GI symptoms, repeat  COVID test negative.   Objective: Vitals:   03/14/21 2037 03/15/21 0051 03/15/21 0319 03/15/21 0815  BP: 118/72 121/64 133/62 (!) 144/71  Pulse: 67 (!) 56 (!) 56 (!) 55  Resp:  17 16 19   Temp: 98.5 F (36.9 C) (!) 97.5 F (36.4 C) 98.6 F (37 C) (!) 97.5 F (36.4 C)  TempSrc: Oral Oral Oral Oral  SpO2: 96% 98% 97% 98%    Intake/Output Summary (Last 24 hours) at 03/15/2021 1325 Last data filed at 03/15/2021 0500 Gross per 24 hour  Intake 120 ml  Output --  Net 120 ml    There were no vitals filed for this visit.  Examination:  General exam: Appears comfortable, deconditioned, not in any acute distress.   Respiratory system: Clear to auscultation bilaterally, RR 13  Cardiovascular system: S1-S2 heard, regular rate and rhythm, no murmur. Gastrointestinal system: Soft, nontender, nondistended, BS+. Central nervous system: Alert, Awake, oriented x 2, following commands.  No focal neurological deficits Extremities: No edema, no cyanosis, no clubbing. Skin: No rashes, lesions or ulcers Psychiatry: Mood & affect appropriate.     Data Reviewed: I have personally reviewed following labs and imaging studies  CBC: Recent Labs  Lab 03/09/21 0706 03/12/21 0351  WBC 6.8 6.2  HGB 11.5* 11.6*  HCT 33.6* 34.1*  MCV 91.6 92.9  PLT 136* 135*    Basic Metabolic Panel: Recent Labs  Lab 03/09/21 0706 03/10/21 0140 03/12/21 0351 03/13/21 0511 03/15/21 0320  NA 140 139 140 140 142  K 3.6 3.9 3.9 3.9 3.9  CL 108 108 107 105 108  CO2 25 24 26 24 27   GLUCOSE 89 83 109* 140* 95  BUN 18 20 25* 22 17  CREATININE 1.04* 1.28* 1.52* 1.31* 1.21*  CALCIUM 8.8* 8.7* 9.3 9.5 9.0  MG 1.9  --   --   --   --   PHOS 2.8  --   --   --   --     GFR: CrCl cannot be calculated (Unknown ideal weight.). Liver Function Tests: Recent Labs  Lab 03/09/21 0706  AST 24  ALT 15  ALKPHOS 52  BILITOT 0.7  PROT 5.8*  ALBUMIN 3.4*    No results for input(s): LIPASE, AMYLASE in the last  168 hours. No results for input(s): AMMONIA in the last 168 hours. Coagulation Profile: No results for input(s): INR, PROTIME in the last 168 hours. Cardiac Enzymes: No results for input(s): CKTOTAL, CKMB, CKMBINDEX, TROPONINI in the last 168 hours. BNP (last 3 results) No results for input(s): PROBNP in the last 8760 hours. HbA1C: No results for input(s): HGBA1C in the last 72 hours. CBG: No results for input(s): GLUCAP in the last 168 hours. Lipid Profile: No results for input(s): CHOL, HDL, LDLCALC, TRIG, CHOLHDL, LDLDIRECT in the last 72 hours. Thyroid Function Tests: No results for input(s): TSH, T4TOTAL, FREET4, T3FREE, THYROIDAB in the last 72 hours.  Anemia Panel: No results for input(s): VITAMINB12, FOLATE, FERRITIN, TIBC, IRON, RETICCTPCT in the last 72 hours. Sepsis Labs: No results for input(s): PROCALCITON, LATICACIDVEN in the last 168 hours.  Recent Results (from the past 240 hour(s))  Resp Panel by RT-PCR (Flu A&B, Covid) Nasopharyngeal Swab     Status: None   Collection Time: 03/08/21  1:40 AM   Specimen: Nasopharyngeal Swab; Nasopharyngeal(NP) swabs  in vial transport medium  Result Value Ref Range Status   SARS Coronavirus 2 by RT PCR NEGATIVE NEGATIVE Final    Comment: (NOTE) SARS-CoV-2 target nucleic acids are NOT DETECTED.  The SARS-CoV-2 RNA is generally detectable in upper respiratory specimens during the acute phase of infection. The lowest concentration of SARS-CoV-2 viral copies this assay can detect is 138 copies/mL. A negative result does not preclude SARS-Cov-2 infection and should not be used as the sole basis for treatment or other patient management decisions. A negative result may occur with  improper specimen collection/handling, submission of specimen other than nasopharyngeal swab, presence of viral mutation(s) within the areas targeted by this assay, and inadequate number of viral copies(<138 copies/mL). A negative result must be combined  with clinical observations, patient history, and epidemiological information. The expected result is Negative.  Fact Sheet for Patients:  EntrepreneurPulse.com.au  Fact Sheet for Healthcare Providers:  IncredibleEmployment.be  This test is no t yet approved or cleared by the [place] FDA and  has been authorized for detection and/or diagnosis of SARS-CoV-2 by FDA under an Emergency Use Authorization (EUA). This EUA will remain  in effect (meaning this test can be used) for the duration of the COVID-19 declaration under Section 564(b)(1) of the Act, 21 U.S.C.section 360bbb-3(b)(1), unless the authorization is terminated  or revoked sooner.       Influenza A by PCR NEGATIVE NEGATIVE Final   Influenza B by PCR NEGATIVE NEGATIVE Final    Comment: (NOTE) The Xpert Xpress SARS-CoV-2/FLU/RSV plus assay is intended as an aid in the diagnosis of influenza from Nasopharyngeal swab specimens and should not be used as a sole basis for treatment. Nasal washings and aspirates are unacceptable for Xpert Xpress SARS-CoV-2/FLU/RSV testing.  Fact Sheet for Patients: EntrepreneurPulse.com.au  Fact Sheet for Healthcare Providers: IncredibleEmployment.be  This test is not yet approved or cleared by the [place] FDA and has been authorized for detection and/or diagnosis of SARS-CoV-2 by FDA under an Emergency Use Authorization (EUA). This EUA will remain in effect (meaning this test can be used) for the duration of the COVID-19 declaration under Section 564(b)(1) of the Act, 21 U.S.C. section 360bbb-3(b)(1), unless the authorization is terminated or revoked.  Performed at [facility], [place] [address], [place], [place] [address]   Resp Panel by RT-PCR (Flu A&B, Covid) Nasopharyngeal Swab     Status: Abnormal   Collection Time: 03/12/21  3:52 PM   Specimen: Nasopharyngeal Swab; Nasopharyngeal(NP) swabs in  vial transport medium  Result Value Ref Range Status   SARS Coronavirus 2 by RT PCR POSITIVE (A) NEGATIVE Final    Comment: RESULT CALLED TO, READ BACK BY AND VERIFIED WITH: Jeani Hawking RN [id number] 03/12/21 A BROWNING (NOTE) SARS-CoV-2 target nucleic acids are DETECTED.  The SARS-CoV-2 RNA is generally detectable in upper respiratory specimens during the acute phase of infection. Positive results are indicative of the presence of the identified virus, but do not rule out bacterial infection or co-infection with other pathogens not detected by the test. Clinical correlation with patient history and other diagnostic information is necessary to determine patient infection status. The expected result is Negative.  Fact Sheet for Patients: EntrepreneurPulse.com.au  Fact Sheet for Healthcare Providers: IncredibleEmployment.be  This test is not yet approved or cleared by the [place] FDA and  has been authorized for detection and/or diagnosis of SARS-CoV-2 by FDA under an Emergency Use Authorization (EUA).  This EUA will remain in effect (meaning this test can  be used) for the duration of  the COVID-19 declaration under Section 564(b)(1) of the Act, 21 U.S.C. section 360bbb-3(b)(1), unless the authorization is terminated or revoked sooner.     Influenza A by PCR NEGATIVE NEGATIVE Final   Influenza B by PCR NEGATIVE NEGATIVE Final    Comment: (NOTE) The Xpert Xpress SARS-CoV-2/FLU/RSV plus assay is intended as an aid in the diagnosis of influenza from Nasopharyngeal swab specimens and should not be used as a sole basis for treatment. Nasal washings and aspirates are unacceptable for Xpert Xpress SARS-CoV-2/FLU/RSV testing.  Fact Sheet for Patients: EntrepreneurPulse.com.au  Fact Sheet for Healthcare Providers: IncredibleEmployment.be  This test is not yet approved or cleared by the [place] FDA and has  been authorized for detection and/or diagnosis of SARS-CoV-2 by FDA under an Emergency Use Authorization (EUA). This EUA will remain in effect (meaning this test can be used) for the duration of the COVID-19 declaration under Section 564(b)(1) of the Act, 21 U.S.C. section 360bbb-3(b)(1), unless the authorization is terminated or revoked.  Performed at [facility], [place] [address] [place], [place] [address]   Resp Panel by RT-PCR (Flu A&B, Covid) Nasopharyngeal Swab     Status: None   Collection Time: 03/13/21  8:04 AM   Specimen: Nasopharyngeal Swab; Nasopharyngeal(NP) swabs in vial transport medium  Result Value Ref Range Status   SARS Coronavirus 2 by RT PCR NEGATIVE NEGATIVE Final    Comment: (NOTE) SARS-CoV-2 target nucleic acids are NOT DETECTED.  The SARS-CoV-2 RNA is generally detectable in upper respiratory specimens during the acute phase of infection. The lowest concentration of SARS-CoV-2 viral copies this assay can detect is 138 copies/mL. A negative result does not preclude SARS-Cov-2 infection and should not be used as the sole basis for treatment or other patient management decisions. A negative result may occur with  improper specimen collection/handling, submission of specimen other than nasopharyngeal swab, presence of viral mutation(s) within the areas targeted by this assay, and inadequate number of viral copies(<138 copies/mL). A negative result must be combined with clinical observations, patient history, and epidemiological information. The expected result is Negative.  Fact Sheet for Patients:  EntrepreneurPulse.com.au  Fact Sheet for Healthcare Providers:  IncredibleEmployment.be  This test is no t yet approved or cleared by the [place] FDA and  has been authorized for detection and/or diagnosis of SARS-CoV-2 by FDA under an Emergency Use Authorization (EUA). This EUA will remain  in effect (meaning this  test can be used) for the duration of the COVID-19 declaration under Section 564(b)(1) of the Act, 21 U.S.C.section 360bbb-3(b)(1), unless the authorization is terminated  or revoked sooner.       Influenza A by PCR NEGATIVE NEGATIVE Final   Influenza B by PCR NEGATIVE NEGATIVE Final    Comment: (NOTE) The Xpert Xpress SARS-CoV-2/FLU/RSV plus assay is intended as an aid in the diagnosis of influenza from Nasopharyngeal swab specimens and should not be used as a sole basis for treatment. Nasal washings and aspirates are unacceptable for Xpert Xpress SARS-CoV-2/FLU/RSV testing.  Fact Sheet for Patients: EntrepreneurPulse.com.au  Fact Sheet for Healthcare Providers: IncredibleEmployment.be  This test is not yet approved or cleared by the [place] FDA and has been authorized for detection and/or diagnosis of SARS-CoV-2 by FDA under an Emergency Use Authorization (EUA). This EUA will remain in effect (meaning this test can be used) for the duration of the COVID-19 declaration under Section 564(b)(1) of the Act, 21 U.S.C. section 360bbb-3(b)(1), unless the authorization is  terminated or revoked.  Performed at [facility], [place] [address], [place], [place] [address]      Radiology Studies: No results found.  Scheduled Meds:  amLODipine  10 mg Oral Daily   fentaNYL  1 patch Transdermal Q3 days   heparin  5,000 Units Subcutaneous Q8H   hydrALAZINE  25 mg Oral Q8H   levothyroxine  75 mcg Oral QAC breakfast   nystatin cream  1 application Topical BID   omega-3 acid ethyl esters  1 g Oral Daily   polyethylene glycol  17 g Oral Daily   QUEtiapine  25 mg Oral QHS   senna  8.6 mg Oral Daily   Continuous Infusions:   LOS: 6 days    Time spent: 25 mins  Shawna Clamp, MD [facility]   If 7PM-7AM, please contact night-coverage

## 2021-03-16 DIAGNOSIS — G9340 Encephalopathy, unspecified: Secondary | ICD-10-CM | POA: Diagnosis not present

## 2021-03-16 LAB — BASIC METABOLIC PANEL
Anion gap: 8 (ref 5–15)
BUN: 20 mg/dL (ref 8–23)
CO2: 23 mmol/L (ref 22–32)
Calcium: 9 mg/dL (ref 8.9–10.3)
Chloride: 108 mmol/L (ref 98–111)
Creatinine, Ser: 1.25 mg/dL — ABNORMAL HIGH (ref 0.44–1.00)
GFR, Estimated: 39 mL/min — ABNORMAL LOW (ref >60)
Glucose, Bld: 90 mg/dL (ref 70–99)
Potassium: 4.1 mmol/L (ref 3.5–5.1)
Sodium: 139 mmol/L (ref 135–145)

## 2021-03-16 MED ORDER — HYDROMORPHONE HCL 1 MG/ML IJ SOLN
0.5000 mg | INTRAMUSCULAR | Status: DC | PRN
  Administered 2021-03-17 – 2021-03-18 (×2): 0.5 mg via INTRAVENOUS
  Filled 2021-03-16 (×2): qty 0.5

## 2021-03-16 NOTE — Plan of Care (Signed)
Pt is alert oriented x 1. Pt confused, trying to get out of bed multiple times. Reoriented, assisted back to bed. Bed alarm is on. Pts family assisted in pts care, encouraging pt to take medications. Pt is resting.   Problem: Education: Goal: Knowledge of General Education information will improve Description: Including pain rating scale, medication(s)/side effects and non-pharmacologic comfort measures Outcome: Progressing   Problem: Health Behavior/Discharge Planning: Goal: Ability to manage health-related needs will improve Outcome: Progressing   Problem: Clinical Measurements: Goal: Ability to maintain clinical measurements within normal limits will improve Outcome: Progressing Goal: Will remain free from infection Outcome: Progressing Goal: Diagnostic test results will improve Outcome: Progressing Goal: Respiratory complications will improve Outcome: Progressing Goal: Cardiovascular complication will be avoided Outcome: Progressing   Problem: Activity: Goal: Risk for activity intolerance will decrease Outcome: Progressing   Problem: Nutrition: Goal: Adequate nutrition will be maintained Outcome: Progressing   Problem: Coping: Goal: Level of anxiety will decrease Outcome: Progressing   Problem: Elimination: Goal: Will not experience complications related to bowel motility Outcome: Progressing Goal: Will not experience complications related to urinary retention Outcome: Progressing   Problem: Pain Managment: Goal: General experience of comfort will improve Outcome: Progressing   Problem: Safety: Goal: Ability to remain free from injury will improve Outcome: Progressing   Problem: Skin Integrity: Goal: Risk for impaired skin integrity will decrease Outcome: Progressing

## 2021-03-16 NOTE — Progress Notes (Signed)
Occupational Therapy Treatment Patient Details Name: Alexandra Alexander MRN: [id number] DOB: 1922-03-27 Today's Date: 03/16/2021   History of present illness Pt is a 87 y/o female who presented to the ED s/p unwitnessed fall at her assisted living facility. In the ED She is found to be confused and CT head and C-spine and x-ray pelvis were completed.  CT C-spine shows C1 ring fracture. PMH significant for hypertension, hypothyroidism, chronic kidney disease stage IIIb, prior L BKA (prosthesis in room).   OT comments  Patient in bed and easily aroused and agreeable to OT session. Patient was able to donn sock on RLE with min assist and donn LLE prosthesis.  Patient ambulated to bathroom with RW with frequent cues for safety and was min assist to transfer to toilet. Patient was able to perform toilet hygiene seated. Patient performed grooming standing at sink with verbal cues for sequencing. Patient returned to EOB and was min assist to return to supine. Acute OT to continue to follow.     Recommendations for follow up therapy are one component of a multi-disciplinary discharge planning process, led by the attending physician.  Recommendations may be updated based on patient status, additional functional criteria and insurance authorization.    Follow Up Recommendations  SNF    Equipment Recommendations  None recommended by OT    Recommendations for Other Services      Precautions / Restrictions Precautions Precautions: Fall Precaution Comments: patient is refusing collar Required Braces or Orthoses: Cervical Brace;Other Brace Cervical Brace: Hard collar Other Brace: LLE prosthesis Restrictions Weight Bearing Restrictions: No       Mobility Bed Mobility Overal bed mobility: Needs Assistance Bed Mobility: Supine to Sit;Sit to Supine     Supine to sit: Min guard Sit to supine: Min assist   General bed mobility comments: verbal cues for rail use    Transfers Overall transfer  level: Needs assistance Equipment used: Rolling walker (2 wheeled) Transfers: Sit to/from Stand Sit to Stand: Min assist Stand pivot transfers: Min assist       General transfer comment: poor safety with tranfers    Balance Overall balance assessment: Needs assistance Sitting-balance support: Feet supported Sitting balance-Leahy Scale: Fair Sitting balance - Comments: able to donn prosthesis sitting on eob                                   ADL either performed or assessed with clinical judgement   ADL Overall ADL's : Needs assistance/impaired     Grooming: Wash/dry hands;Wash/dry face;Brushing hair;Standing;Cueing for safety;Cueing for sequencing Grooming Details (indicate cue type and reason): stood at sink with rw             Lower Body Dressing: Minimal assistance Lower Body Dressing Details (indicate cue type and reason): donned sock on RLE Toilet Transfer: Loss adjuster, chartered Details (indicate cue type and reason): impulsive and required min guard and verbal cues for rail use Toileting- Clothing Manipulation and Hygiene: Supervision/safety;Sitting/lateral lean Toileting - Clothing Manipulation Details (indicate cue type and reason): able to perform own hygiene seated on toilet     Functional mobility during ADLs: Min guard;Rolling walker General ADL Comments: returned to bed after toilet transfers and hygiene     Vision       Perception     Praxis      Cognition Arousal/Alertness: Awake/alert Behavior During Therapy: Impulsive Overall Cognitive Status: Impaired/Different from baseline Area  of Impairment: Orientation;Attention;Memory;Following commands;Safety/judgement;Problem solving                 Orientation Level: Disoriented to;Place;Time;Situation Current Attention Level: Sustained Memory: Decreased short-term memory;Decreased recall of precautions Following Commands: Follows one step commands with  increased time Safety/Judgement: Decreased awareness of safety;Decreased awareness of deficits Awareness: Intellectual Problem Solving: Requires verbal cues;Requires tactile cues;Difficulty sequencing General Comments: required frequent cues for safety        Exercises     Shoulder Instructions       General Comments      Pertinent Vitals/ Pain       Pain Assessment: Faces Faces Pain Scale: Hurts a little bit Pain Location: L knee above prosthetic, "a little sore" Pain Descriptors / Indicators: Sore Pain Intervention(s): Monitored during session  Home Living                                          Prior Functioning/Environment              Frequency  Min 2X/week        Progress Toward Goals  OT Goals(current goals can now be found in the care plan section)  Progress towards OT goals: Progressing toward goals  Acute Rehab OT Goals Patient Stated Goal: did not discuss OT Goal Formulation: Patient unable to participate in goal setting Time For Goal Achievement: 03/24/21 Potential to Achieve Goals: Good ADL Goals Pt Will Perform Grooming: with set-up;sitting;standing Pt Will Perform Lower Body Bathing: with set-up;sit to/from stand Pt Will Perform Lower Body Dressing: with set-up;sit to/from stand Pt Will Transfer to Toilet: with supervision;ambulating;regular height toilet  Plan Discharge plan needs to be updated    Co-evaluation                 AM-PAC OT "6 Clicks" Daily Activity     Outcome Measure   Help from another person eating meals?: A Little Help from another person taking care of personal grooming?: A Little Help from another person toileting, which includes using toliet, bedpan, or urinal?: A Little Help from another person bathing (including washing, rinsing, drying)?: A Lot Help from another person to put on and taking off regular upper body clothing?: A Little Help from another person to put on and taking off  regular lower body clothing?: A Lot 6 Click Score: 16    End of Session Equipment Utilized During Treatment: Gait belt;Rolling walker  OT Visit Diagnosis: Unsteadiness on feet (R26.81);History of falling (Z91.81);Other symptoms and signs involving cognitive function;Pain;Other abnormalities of gait and mobility (R26.89)   Activity Tolerance Patient tolerated treatment well   Patient Left in bed;with call bell/phone within reach;with nursing/sitter in room   Nurse Communication Mobility status        Time: 2458-0998 OT Time Calculation (min): 22 min  Charges: OT General Charges $OT Visit: 1 Visit OT Treatments $Self Care/Home Management : 8-22 mins  Lodema Hong, [place] 03/16/2021, 3:52 PM

## 2021-03-16 NOTE — Progress Notes (Signed)
PROGRESS NOTE    Alexandra Alexander  HMC:[id number] DOB: [birth year] DOA: 03/07/2021 PCP: [name], MD   Chief Complaint  Patient presents with   Fall   Brief Narrative/Hospital Course:  87 year old female with HTN, hypothyroidism, CKD stage IIIb, anemia, thrombocytopenia presented to the ED after a fall at the facility.  In the ED CT head and C-spine showed C1 ring fracture, neurosurgeon was consulted advised to place the patient in cervical collar and follow-up outpatient with neurosurgery. Patient EKG was concerning for secondary AV block cardiology consulted and monitored on telemetry, echocardiogram.  Patient was admitted treated for UTI.  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  Subjective:  Overnight aaox1 trying to get out of the bed, skin tear on left wrist and dressed Alert awake oriented to self, Carlyle Lipa to communicate, not in distress or pain No complaints One to one at bedside  Assessment & Plan:  Acute metabolic encephalopathy: Multifactorial in the setting of UTI question postconcussion due to fall.  CT head no acute finding except for C1 ring fracture.  Completed ceftriaxone x3 days.  Mental status has been waxing and waning requiring one-to-one safety sitter, gets agitated and restless during night needing some restraints at night.  Mental status improving, On Seroquel 25 mg at bedtime and may need to go up tonight.She is on fentanyl patch- which is her home meds, limit sedatives/psychotropic medication  Bradycardia secondary AV block type I seen by cardiology EKG showed normal sinus rhythm with sinus arrhythmia, diastolic held echo done showed EF 60 to 65% no R WMA.  Heart rate improved  AKI on CKD IIIb: stable Recent Labs  Lab 03/10/21 0140 03/12/21 0351 03/13/21 0511  03/15/21 0320 03/16/21 0511  BUN 20 25* 22 17 20   CREATININE 1.28* 1.52* 1.31* 1.21* 1.25*    Hypothyroidism: Continue her Synthroid  Hypertensive urgency: Blood pressure improved continue amlodipine, hydralazine  C1 cervical fracture: Minimally displaced fractures of the left and right lateral aspects of the C1 ring, fracture age indeterminate as per ED physician who had discussed with the neurosurgeon on-call on 9/29-neurosurgery recommended no clinical intervention the patient is without symptoms and patient was planned for outpatient follow-up with neurosurgery discharged home in the ED Patient has had multiple falls at home over last 1 to 2 years  it seems.  Chronic anemia mild Chronic thrombocytopenia, mild: Outpatient follow-up Recent Labs  Lab 03/12/21 0351  HGB 11.6*  HCT 34.1*  WBC 6.2  PLT 135*    Incidental COVID-positive first test negative, incidental positive but retest negative Off isolation, no symptoms. Diet Order             Diet - low sodium heart healthy           Diet Carb Modified           Diet Heart Room service appropriate? Yes; Fluid consistency: Thin  Diet effective now                 DVT prophylaxis: heparin injection 5,000 Units Start: 03/08/21 0600 Code Status:   Code Status: DNR  Family Communication: plan of care discussed with patient at bedside.  Status is: Inpatient Remains inpatient appropriate because:Unsafe d/c plan and Inpatient level of care appropriate due to severity of illness Dispo: [facility]  Anticipated d/c is to:  facility              Patient currently is not medically stable to d/c.   Difficult to place patient No Objective: Vitals: Today's Vitals   03/15/21 1700 03/15/21 2030 03/16/21 0443 03/16/21 0712  BP: 128/70  (!) 158/79 (!) 142/75  Pulse: 72  (!) 57 64  Resp: 20  19 19   Temp: 97.7 F (36.5 C)  (!) 97.4 F (36.3 C) 97.7 F (36.5 C)  TempSrc: Axillary  Oral Oral   SpO2: 100%  96% 94%  PainSc:  0-No pain     Physical Examination: General exam: Aa0x1, weak,older than stated age. HEENT:Oral mucosa moist, Ear/Nose WNL grossly,dentition normal. Respiratory system: B/l  cklear BS, no use of accessory muscle, non tender. Cardiovascular system: S1 & S2 +,No JVD. Gastrointestinal system: Abdomen soft, NT,ND, BS+. Nervous System:Alert, awake, moving extremities. Extremities: Leg edema none, LT BKA.  Skin: No rashes, no icterus. MSK: Normal muscle bulk,tone, power. Lt BKA  Medications reviewed:  Scheduled Meds:  amLODipine  10 mg Oral Daily   fentaNYL  1 patch Transdermal Q3 days   heparin  5,000 Units Subcutaneous Q8H   hydrALAZINE  25 mg Oral Q8H   levothyroxine  75 mcg Oral QAC breakfast   nystatin cream  1 application Topical BID   omega-3 acid ethyl esters  1 g Oral Daily   polyethylene glycol  17 g Oral Daily   QUEtiapine  25 mg Oral QHS   senna  8.6 mg Oral Daily   Continuous Infusions:  Intake/Output  Intake/Output Summary (Last 24 hours) at 03/16/2021 0728 Last data filed at 03/15/2021 1300 Gross per 24 hour  Intake 320 ml  Output --  Net 320 ml   Intake/Output from previous day: 10/06 0701 - 10/07 0700 In: 320 [P.O.:320] Out: -  Net IO Since Admission: 1,240 mL [03/16/21 0728]   Weight change:   Wt Readings from Last 3 Encounters:  02/03/18 54.4 kg  07/22/17 57.6 kg  07/13/17 57.8 kg     Consultants:see note  Procedures:see note Antimicrobials: Anti-infectives (From admission, onward)    Start     Dose/Rate Route Frequency Ordered Stop   03/10/21 1000  cefTRIAXone (ROCEPHIN) 1 g in sodium chloride 0.9 % 100 mL IVPB        1 g 200 mL/hr over 30 Minutes Intravenous Every 24 hours 03/10/21 0925 03/12/21 1147   03/08/21 0230  cefTRIAXone (ROCEPHIN) 1 g in sodium chloride 0.9 % 100 mL IVPB        1 g 200 mL/hr over 30 Minutes Intravenous  Once 03/08/21 0229 03/08/21 0455      Culture/Microbiology    Component Value  Date/Time   SDES  07/15/2017 0453    BLOOD RIGHT FOREARM Performed at [facility], [place] [address], [place], [place] [address]    SPECREQUEST  07/15/2017 0453    BOTTLES DRAWN AEROBIC ONLY Blood Culture adequate volume Performed at [facility] [address] [place], [place] [address]    CULT  07/15/2017 0453    NO GROWTH 5 DAYS Performed at [facility], [place] [address], [place], [place] [address]    REPTSTATUS 07/20/2017 FINAL 07/15/2017 0453    Other culture-see note  Unresulted Labs (From admission, onward)     Start     Ordered   03/15/21 2774  Basic metabolic panel  Daily,   R     Question:  Specimen  collection method  Answer:  Lab=Lab collect   03/14/21 1130           Data Reviewed: I have personally reviewed following labs and imaging studies CBC: Recent Labs  Lab 03/12/21 0351  WBC 6.2  HGB 11.6*  HCT 34.1*  MCV 92.9  PLT 952*   Basic Metabolic Panel: Recent Labs  Lab 03/10/21 0140 03/12/21 0351 03/13/21 0511 03/15/21 0320 03/16/21 0511  NA 139 140 140 142 139  K 3.9 3.9 3.9 3.9 4.1  CL 108 107 105 108 108  CO2 24 26 24 27 23   GLUCOSE 83 109* 140* 95 90  BUN 20 25* 22 17 20   CREATININE 1.28* 1.52* 1.31* 1.21* 1.25*  CALCIUM 8.7* 9.3 9.5 9.0 9.0   GFR: CrCl cannot be calculated (Unknown ideal weight.). Liver Function Tests: No results for input(s): AST, ALT, ALKPHOS, BILITOT, PROT, ALBUMIN in the last 168 hours. No results for input(s): LIPASE, AMYLASE in the last 168 hours. No results for input(s): AMMONIA in the last 168 hours. Coagulation Profile: No results for input(s): INR, PROTIME in the last 168 hours. Cardiac Enzymes: No results for input(s): CKTOTAL, CKMB, CKMBINDEX, TROPONINI in the last 168 hours. BNP (last 3 results) No results for input(s): PROBNP in the last 8760 hours. HbA1C: No results for input(s): HGBA1C in the last 72 hours. CBG: No results for input(s): GLUCAP in the last  168 hours. Lipid Profile: No results for input(s): CHOL, HDL, LDLCALC, TRIG, CHOLHDL, LDLDIRECT in the last 72 hours. Thyroid Function Tests: No results for input(s): TSH, T4TOTAL, FREET4, T3FREE, THYROIDAB in the last 72 hours. Anemia Panel: No results for input(s): VITAMINB12, FOLATE, FERRITIN, TIBC, IRON, RETICCTPCT in the last 72 hours. Sepsis Labs: No results for input(s): PROCALCITON, LATICACIDVEN in the last 168 hours.  Recent Results (from the past 240 hour(s))  Resp Panel by RT-PCR (Flu A&B, Covid) Nasopharyngeal Swab     Status: None   Collection Time: 03/08/21  1:40 AM   Specimen: Nasopharyngeal Swab; Nasopharyngeal(NP) swabs in vial transport medium  Result Value Ref Range Status   SARS Coronavirus 2 by RT PCR NEGATIVE NEGATIVE Final    Comment: (NOTE) SARS-CoV-2 target nucleic acids are NOT DETECTED.  The SARS-CoV-2 RNA is generally detectable in upper respiratory specimens during the acute phase of infection. The lowest concentration of SARS-CoV-2 viral copies this assay can detect is 138 copies/mL. A negative result does not preclude SARS-Cov-2 infection and should not be used as the sole basis for treatment or other patient management decisions. A negative result may occur with  improper specimen collection/handling, submission of specimen other than nasopharyngeal swab, presence of viral mutation(s) within the areas targeted by this assay, and inadequate number of viral copies(<138 copies/mL). A negative result must be combined with clinical observations, patient history, and epidemiological information. The expected result is Negative.  Fact Sheet for Patients:  EntrepreneurPulse.com.au  Fact Sheet for Healthcare Providers:  IncredibleEmployment.be  This test is no t yet approved or cleared by the [place] FDA and  has been authorized for detection and/or diagnosis of SARS-CoV-2 by FDA under an Emergency Use  Authorization (EUA). This EUA will remain  in effect (meaning this test can be used) for the duration of the COVID-19 declaration under Section 564(b)(1) of the Act, 21 U.S.C.section 360bbb-3(b)(1), unless the authorization is terminated  or revoked sooner.       Influenza A by PCR NEGATIVE NEGATIVE Final   Influenza B by PCR NEGATIVE NEGATIVE Final  Comment: (NOTE) The Xpert Xpress SARS-CoV-2/FLU/RSV plus assay is intended as an aid in the diagnosis of influenza from Nasopharyngeal swab specimens and should not be used as a sole basis for treatment. Nasal washings and aspirates are unacceptable for Xpert Xpress SARS-CoV-2/FLU/RSV testing.  Fact Sheet for Patients: EntrepreneurPulse.com.au  Fact Sheet for Healthcare Providers: IncredibleEmployment.be  This test is not yet approved or cleared by the [place] FDA and has been authorized for detection and/or diagnosis of SARS-CoV-2 by FDA under an Emergency Use Authorization (EUA). This EUA will remain in effect (meaning this test can be used) for the duration of the COVID-19 declaration under Section 564(b)(1) of the Act, 21 U.S.C. section 360bbb-3(b)(1), unless the authorization is terminated or revoked.  Performed at [facility], [place] [address], [place], [place] [address]   Resp Panel by RT-PCR (Flu A&B, Covid) Nasopharyngeal Swab     Status: Abnormal   Collection Time: 03/12/21  3:52 PM   Specimen: Nasopharyngeal Swab; Nasopharyngeal(NP) swabs in vial transport medium  Result Value Ref Range Status   SARS Coronavirus 2 by RT PCR POSITIVE (A) NEGATIVE Final    Comment: RESULT CALLED TO, READ BACK BY AND VERIFIED WITH: Jeani Hawking RN [id number] 03/12/21 A BROWNING (NOTE) SARS-CoV-2 target nucleic acids are DETECTED.  The SARS-CoV-2 RNA is generally detectable in upper respiratory specimens during the acute phase of infection. Positive results are indicative of the presence of the  identified virus, but do not rule out bacterial infection or co-infection with other pathogens not detected by the test. Clinical correlation with patient history and other diagnostic information is necessary to determine patient infection status. The expected result is Negative.  Fact Sheet for Patients: EntrepreneurPulse.com.au  Fact Sheet for Healthcare Providers: IncredibleEmployment.be  This test is not yet approved or cleared by the [place] FDA and  has been authorized for detection and/or diagnosis of SARS-CoV-2 by FDA under an Emergency Use Authorization (EUA).  This EUA will remain in effect (meaning this test can  be used) for the duration of  the COVID-19 declaration under Section 564(b)(1) of the Act, 21 U.S.C. section 360bbb-3(b)(1), unless the authorization is terminated or revoked sooner.     Influenza A by PCR NEGATIVE NEGATIVE Final   Influenza B by PCR NEGATIVE NEGATIVE Final    Comment: (NOTE) The Xpert Xpress SARS-CoV-2/FLU/RSV plus assay is intended as an aid in the diagnosis of influenza from Nasopharyngeal swab specimens and should not be used as a sole basis for treatment. Nasal washings and aspirates are unacceptable for Xpert Xpress SARS-CoV-2/FLU/RSV testing.  Fact Sheet for Patients: EntrepreneurPulse.com.au  Fact Sheet for Healthcare Providers: IncredibleEmployment.be  This test is not yet approved or cleared by the [place] FDA and has been authorized for detection and/or diagnosis of SARS-CoV-2 by FDA under an Emergency Use Authorization (EUA). This EUA will remain in effect (meaning this test can be used) for the duration of the COVID-19 declaration under Section 564(b)(1) of the Act, 21 U.S.C. section 360bbb-3(b)(1), unless the authorization is terminated or revoked.  Performed at [facility], [place] [address], [place], [place] [address]   Resp Panel by  RT-PCR (Flu A&B, Covid) Nasopharyngeal Swab     Status: None   Collection Time: 03/13/21  8:04 AM   Specimen: Nasopharyngeal Swab; Nasopharyngeal(NP) swabs in vial transport medium  Result Value Ref Range Status   SARS Coronavirus 2 by RT PCR NEGATIVE NEGATIVE Final    Comment: (NOTE) SARS-CoV-2 target nucleic acids are NOT  DETECTED.  The SARS-CoV-2 RNA is generally detectable in upper respiratory specimens during the acute phase of infection. The lowest concentration of SARS-CoV-2 viral copies this assay can detect is 138 copies/mL. A negative result does not preclude SARS-Cov-2 infection and should not be used as the sole basis for treatment or other patient management decisions. A negative result may occur with  improper specimen collection/handling, submission of specimen other than nasopharyngeal swab, presence of viral mutation(s) within the areas targeted by this assay, and inadequate number of viral copies(<138 copies/mL). A negative result must be combined with clinical observations, patient history, and epidemiological information. The expected result is Negative.  Fact Sheet for Patients:  EntrepreneurPulse.com.au  Fact Sheet for Healthcare Providers:  IncredibleEmployment.be  This test is no t yet approved or cleared by the [place] FDA and  has been authorized for detection and/or diagnosis of SARS-CoV-2 by FDA under an Emergency Use Authorization (EUA). This EUA will remain  in effect (meaning this test can be used) for the duration of the COVID-19 declaration under Section 564(b)(1) of the Act, 21 U.S.C.section 360bbb-3(b)(1), unless the authorization is terminated  or revoked sooner.       Influenza A by PCR NEGATIVE NEGATIVE Final   Influenza B by PCR NEGATIVE NEGATIVE Final    Comment: (NOTE) The Xpert Xpress SARS-CoV-2/FLU/RSV plus assay is intended as an aid in the diagnosis of influenza from Nasopharyngeal swab  specimens and should not be used as a sole basis for treatment. Nasal washings and aspirates are unacceptable for Xpert Xpress SARS-CoV-2/FLU/RSV testing.  Fact Sheet for Patients: EntrepreneurPulse.com.au  Fact Sheet for Healthcare Providers: IncredibleEmployment.be  This test is not yet approved or cleared by the [place] FDA and has been authorized for detection and/or diagnosis of SARS-CoV-2 by FDA under an Emergency Use Authorization (EUA). This EUA will remain in effect (meaning this test can be used) for the duration of the COVID-19 declaration under Section 564(b)(1) of the Act, 21 U.S.C. section 360bbb-3(b)(1), unless the authorization is terminated or revoked.  Performed at [facility], [place] [address], [place], [place] [address]      Radiology Studies: No results found.   LOS: 7 days   Antonieta Pert, MD [facility]  03/16/2021, 7:28 AM

## 2021-03-16 NOTE — TOC Progression Note (Signed)
Transition of Care [facility]) - Progression Note    Patient Details  Name: Alexandra Alexander MRN: [id number] Date of Birth: 1921/10/27  Transition of Care [facility]) CM/SW [place], [place] Phone Number: 03/16/2021, 10:05 AM  Clinical Narrative:   CSW discussed patient with [facility], confirmed bed offer. CSW will need to confirm bed availability whenever patient is able to remain sitter free for over 24 hours to admit to SNF. CSW to follow.    Expected Discharge Plan: [place] Barriers to Discharge: Continued Medical Work up, Requiring sitter/restraints  Expected Discharge Plan and Services Expected Discharge Plan: [place] Choice: [place] arrangements for the past 2 months: [place] Expected Discharge Date: 03/14/21                                     Social Determinants of Health (SDOH) Interventions    Readmission Risk Interventions No flowsheet data found.

## 2021-03-16 NOTE — Progress Notes (Signed)
Provider paged to inform of  skin tear to left forearm.

## 2021-03-16 NOTE — Progress Notes (Signed)
Physical Therapy Treatment Patient Details Name: Alexandra Alexander MRN: [id number] DOB: 1921-08-14 Today's Date: 03/16/2021   History of Present Illness Pt is a 87 y/o female who presented to the ED s/p unwitnessed fall at her assisted living facility. In the ED She is found to be confused and CT head and C-spine and x-ray pelvis were completed.  CT C-spine shows C1 ring fracture. PMH significant for hypertension, hypothyroidism, chronic kidney disease stage IIIb, prior L BKA (prosthesis in room).    PT Comments    Pt received in supine, alert and agreeable to therapy session. Pt internally distracted during session and singing gospel music, but responding well to simple 1-step mobility cues with multimodal cueing. Pt with decreased safety while standing at sink for hygiene and impulsive at times to remove hands from RW although balance poor without UE support. Pt needing min to modA for gait progression in room. Of note, limited gait distance due to pt c/o L knee pain below prosthetic and noted reddened callus where skin contacting prosthetic surface, skin should not be discolored with proper limb fit and pt would benefit from prosthetist consult to assess. Pt continues to benefit from PT services to progress toward functional mobility goals.   Recommendations for follow up therapy are one component of a multi-disciplinary discharge planning process, led by the attending physician.  Recommendations may be updated based on patient status, additional functional criteria and insurance authorization.  Follow Up Recommendations  SNF;Supervision/Assistance - 24 hour     Equipment Recommendations  Rolling walker with 5" wheels;Other (comment) (youth sized, may consider 3in1)    Recommendations for Other Services Other (comment) (prosthetist consult)     Precautions / Restrictions Precautions Precautions: Fall Precaution Booklet Issued: Yes (comment) Precaution Comments: patient is refusing  collar Required Braces or Orthoses: Cervical Brace;Other Brace Cervical Brace: Hard collar ((could not find it in room, RN messaged to see if we can get her another to try again since she is calmer)) Other Brace: LLE prosthesis Restrictions Weight Bearing Restrictions: No     Mobility  Bed Mobility Overal bed mobility: Needs Assistance Bed Mobility: Supine to Sit     Supine to sit: Min assist Sit to supine: Min assist   General bed mobility comments: verbal cues for rail use and safety    Transfers Overall transfer level: Needs assistance Equipment used: Rolling walker (2 wheeled) Transfers: Sit to/from Stand Sit to Stand: Min assist Stand pivot transfers: Min assist       General transfer comment: poor safety with transfers, posterior lean upon standing needs external assist  Ambulation/Gait Ambulation/Gait assistance: Mod assist;Min assist Gait Distance (Feet): 25 Feet (x2 with seated break) Assistive device: Rolling walker (2 wheeled) Gait Pattern/deviations: Step-through pattern;Leaning posteriorly;Drifts right/left;Trunk flexed;Narrow base of support   Gait velocity interpretation: <1.31 ft/sec, indicative of household ambulator General Gait Details: pt with inconsistent step length and narrow BOS, and consistent flexed trunk/downward gaze causing posterior lean/overall instability. Pt needing max verbal cues, consistent assist to manage RW and modA trunk support for safety at times/in narrow spaces in room      Balance Overall balance assessment: Needs assistance Sitting-balance support: Feet supported Sitting balance-Leahy Scale: Fair Sitting balance - Comments: able to don prosthesis sitting on eob Postural control: Posterior lean;Right lateral lean Standing balance support: Bilateral upper extremity supported Standing balance-Leahy Scale: Poor Standing balance comment: needing external support varying min/modA and RW          Cognition  Arousal/Alertness: Awake/alert Behavior During  Therapy: Impulsive Overall Cognitive Status: Impaired/Different from baseline Area of Impairment: Orientation;Attention;Memory;Following commands;Safety/judgement;Problem solving        Orientation Level: Disoriented to;Place;Time;Situation Current Attention Level: Sustained Memory: Decreased short-term memory;Decreased recall of precautions Following Commands: Follows one step commands with increased time Safety/Judgement: Decreased awareness of safety;Decreased awareness of deficits Awareness: Intellectual Problem Solving: Requires verbal cues;Requires tactile cues;Difficulty sequencing General Comments: required frequent cues for safety; pt internally distracted while singing songs during mobility and tending to hold RW too far in front of her; HoH         General Comments General comments (skin integrity, edema, etc.): Pt has area of skin breakdown below her L knee from her prosthetic, appears red, pt c/o pain. Likely needs to consult prosthetist for proper fit.      Pertinent Vitals/Pain Pain Assessment: Faces Faces Pain Scale: Hurts a little bit Pain Location: L knee above prosthetic, "a little sore". area looks a bit red and noted callus from prosthesis, unsure if this is baseline appearance but pt may need to visit prosthetist to assess fit Pain Descriptors / Indicators: Sore Pain Intervention(s): Monitored during session;Repositioned (doffed and re-donned sleeves and prosthetic to ensure no wrinkles in limb guard/sock)           PT Goals (current goals can now be found in the care plan section) Acute Rehab PT Goals Patient Stated Goal: did not discuss PT Goal Formulation: With patient/family Time For Goal Achievement: 03/24/21 Progress towards PT goals: Progressing toward goals    Frequency    Min 3X/week      PT Plan Current plan remains appropriate       AM-PAC PT "6 Clicks" Mobility   Outcome Measure   Help needed turning from your back to your side while in a flat bed without using bedrails?: None Help needed moving from lying on your back to sitting on the side of a flat bed without using bedrails?: A Little Help needed moving to and from a bed to a chair (including a wheelchair)?: A Lot Help needed standing up from a chair using your arms (e.g., wheelchair or bedside chair)?: A Lot Help needed to walk in hospital room?: A Lot Help needed climbing 3-5 steps with a railing? : Total 6 Click Score: 14    End of Session Equipment Utilized During Treatment: Gait belt;Other (comment) (no HCC in room) Activity Tolerance: Patient tolerated treatment well Patient left: in chair;with call bell/phone within reach;with nursing/sitter in room;Other (comment) (sitter Izora Gala in room so deferred chair alarm but sitter notified to have one placed if they DC sitter) Nurse Communication: Mobility status;Other (comment) (red area and callus at LLE residual limb below knee, pt c/o pain) PT Visit Diagnosis: Other abnormalities of gait and mobility (R26.89);Other symptoms and signs involving the nervous system (R29.898)     Time: 1639-1700 PT Time Calculation (min) (ACUTE ONLY): 21 min  Charges:  $Gait Training: 8-22 mins                     Carmella Kees P., PTA Acute Rehabilitation Services Pager: [phone] Office: [place] 03/16/2021, 6:09 PM

## 2021-03-16 NOTE — Progress Notes (Signed)
Pt has skin tear to left forearm, area cleanse and Dressing applied.

## 2021-03-17 DIAGNOSIS — Z515 Encounter for palliative care: Secondary | ICD-10-CM | POA: Diagnosis not present

## 2021-03-17 DIAGNOSIS — Z7189 Other specified counseling: Secondary | ICD-10-CM

## 2021-03-17 DIAGNOSIS — G9340 Encephalopathy, unspecified: Secondary | ICD-10-CM | POA: Diagnosis not present

## 2021-03-17 DIAGNOSIS — Z66 Do not resuscitate: Secondary | ICD-10-CM

## 2021-03-17 LAB — BASIC METABOLIC PANEL
Anion gap: 8 (ref 5–15)
BUN: 22 mg/dL (ref 8–23)
CO2: 24 mmol/L (ref 22–32)
Calcium: 8.9 mg/dL (ref 8.9–10.3)
Chloride: 105 mmol/L (ref 98–111)
Creatinine, Ser: 1.35 mg/dL — ABNORMAL HIGH (ref 0.44–1.00)
GFR, Estimated: 36 mL/min — ABNORMAL LOW (ref >60)
Glucose, Bld: 108 mg/dL — ABNORMAL HIGH (ref 70–99)
Potassium: 4.5 mmol/L (ref 3.5–5.1)
Sodium: 137 mmol/L (ref 135–145)

## 2021-03-17 MED ORDER — QUETIAPINE FUMARATE 50 MG PO TABS
50.0000 mg | ORAL_TABLET | Freq: Every day | ORAL | Status: DC
  Administered 2021-03-17: 50 mg via ORAL
  Filled 2021-03-17: qty 1

## 2021-03-17 MED ORDER — OLANZAPINE 5 MG PO TBDP
2.5000 mg | ORAL_TABLET | Freq: Two times a day (BID) | ORAL | Status: DC | PRN
  Administered 2021-03-18: 2.5 mg via ORAL
  Filled 2021-03-17 (×2): qty 0.5

## 2021-03-17 MED ORDER — LORAZEPAM 2 MG/ML IJ SOLN: 0.5000 mg | Freq: Once | INTRAMUSCULAR | Status: DC

## 2021-03-17 MED ORDER — LORAZEPAM 2 MG/ML IJ SOLN: 0.2500 mg | Freq: Once | INTRAMUSCULAR | Status: DC

## 2021-03-17 MED ORDER — SODIUM CHLORIDE 0.9 % IV SOLN: INTRAVENOUS | Status: DC

## 2021-03-17 NOTE — Plan of Care (Signed)
Pt is alert x 1, confused. Pt has sitter for safety. Pt has been restless, agitated, and has been calling out complaining of pain  to her back but has refused to take PO pain medication. PO pain medication offered multiple times but has refused. On call Provider paged to inform. Received IV order for PRN dilauded.  Pt had bowel movement, cleaned by tech and readjusted in bed.     Problem: Education: Goal: Knowledge of General Education information will improve Description: Including pain rating scale, medication(s)/side effects and non-pharmacologic comfort measures Outcome: Progressing   Problem: Health Behavior/Discharge Planning: Goal: Ability to manage health-related needs will improve Outcome: Progressing   Problem: Clinical Measurements: Goal: Ability to maintain clinical measurements within normal limits will improve Outcome: Progressing Goal: Will remain free from infection Outcome: Progressing Goal: Diagnostic test results will improve Outcome: Progressing Goal: Respiratory complications will improve Outcome: Progressing Goal: Cardiovascular complication will be avoided Outcome: Progressing   Problem: Activity: Goal: Risk for activity intolerance will decrease Outcome: Progressing   Problem: Nutrition: Goal: Adequate nutrition will be maintained Outcome: Progressing   Problem: Coping: Goal: Level of anxiety will decrease Outcome: Progressing   Problem: Elimination: Goal: Will not experience complications related to bowel motility Outcome: Progressing Goal: Will not experience complications related to urinary retention Outcome: Progressing   Problem: Pain Managment: Goal: General experience of comfort will improve Outcome: Progressing   Problem: Safety: Goal: Ability to remain free from injury will improve Outcome: Progressing   Problem: Skin Integrity: Goal: Risk for impaired skin integrity will decrease Outcome: Progressing

## 2021-03-17 NOTE — Progress Notes (Addendum)
PROGRESS NOTE    Alexandra Alexander  FUX:[id number] DOB: 02/15/1922 DOA: 03/07/2021 PCP: [name], MD   Chief Complaint  Patient presents with   Fall   Brief Narrative/Hospital Course:  87 year old female with HTN, hypothyroidism, CKD stage IIIb, anemia, thrombocytopenia presented to the ED after a fall at the facility.  In the ED CT head and C-spine showed C1 ring fracture, neurosurgeon was consulted advised to place the patient in cervical collar and follow-up outpatient with neurosurgery. Patient EKG was concerning for secondary AV block cardiology consulted and monitored on telemetry, echocardiogram.  Patient was admitted treated for UTI.  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  Subjective: Seen and examined this morning.  Patient is alert awake oriented to self she knows she is in the hospital, safety sitter at the bedside. Overnight received IV Dilaudid, had a bowel movement Does not appear to be in distress or pain. Denied any pain.  Assessment & Plan:  Acute metabolic encephalopathy: Multifactorial in the setting of UTI question postconcussion due to fall.  CT head no acute finding except for C1 ring fracture.  Completed ceftriaxone x3 days.  Mental status has been waxing and waning requiring one-to-one safety sitter, gets agitated and restless during night needing some restraints at night.  Mental status improving, On Seroquel 25 mg at bedtime-we will increase to 50 mg bedtime, will see if we can take care of the safety sitter.She is on fentanyl patch- which is her home meds, limit sedatives/psychotropic medication.  Nursing reports poor oral intake add gentle IV fluids times this afternoon.  Bradycardia secondary AV block type I seen by cardiology EKG showed normal sinus rhythm with  sinus arrhythmia, diastolic held echo done showed EF 60 to 65% no R WMA.  Heart rate improved  AKI on CKD IIIb: Renal function is stable Recent Labs  Lab 03/12/21 0351 03/13/21 0511 03/15/21 0320 03/16/21 0511 03/17/21 0257  BUN 25* 22 17 20 22   CREATININE 1.52* 1.31* 1.21* 1.25* 1.35*     Hypothyroidism: Continue her Synthroid  Hypertensive urgency: BP is fairly controlled.continue amlodipine, hydralazine  C1 cervical fracture: Minimally displaced fractures of the left and right lateral aspects of the C1 ring, fracture age indeterminate as per ED physician who had discussed with the neurosurgeon on-call on 9/29-neurosurgery recommended no clinical intervention the patient is without symptoms and patient was planned for outpatient follow-up with neurosurgery discharged home in the ED.Patient has had multiple falls at home over last 1 to 2 years  it seems.  Continue pain management-denied any pain to me this morning.  Chronic anemia mild Chronic thrombocytopenia, mild: Monitor CBC intermittently.   Recent Labs  Lab 03/12/21 0351  HGB 11.6*  HCT 34.1*  WBC 6.2  PLT 135*     Incidental COVID + 03/12/21 but negative in 10/4.  No symptomsOff isolation, no symptoms.  GOC: Currently DNR.  Patient has multiple medical problems with advanced age now with encephalopathy needing one-to-one sitter and will benefit with palliative care consultation while here. Diet Order             Diet - low sodium heart healthy           Diet Carb Modified           Diet Heart Room service appropriate? Yes; Fluid consistency: Thin  Diet effective now  DVT prophylaxis: heparin injection 5,000 Units Start: 03/08/21 0600 Code Status:   Code Status: DNR  Family Communication: plan of care discussed with patient at bedside.  Status is: Inpatient Remains inpatient appropriate because:Unsafe d/c plan and Inpatient level of care appropriate due to severity of illness Dispo: The  patient is from:  facility              Anticipated d/c is to:  SNF              Patient currently is not medically stable to d/c.  Need to be off safety sitter 24 hours prior to discharge   Difficult to place patient No Objective: Vitals: Today's Vitals   03/16/21 2056 03/17/21 0035 03/17/21 0644 03/17/21 0750  BP: (!) 110/58  (!) 145/92 112/63  Pulse: 70  82 79  Resp: 17  (!) 22 20  Temp:   97.8 F (36.6 C) 98.8 F (37.1 C)  TempSrc:   Axillary Oral  SpO2: 94%  98% 94%  PainSc:  Asleep  0-No pain   Physical Examination: General exam: AAOx1-2, elderly, frail, pleasantly confused HEENT:Oral mucosa moist, Ear/Nose WNL grossly, dentition normal. Respiratory system: bilaterally diminished,no use of accessory muscle Cardiovascular system: S1 & S2 +, No JVD,. Gastrointestinal system: Abdomen soft, NT,ND, BS+ Nervous System:Alert, awake, moving extremities and grossly nonfocal Extremities: no edema, lt BKA Skin: No rashes,no icterus. MSK: Normal muscle bulk,tone, power   Medications reviewed:  Scheduled Meds:  amLODipine  10 mg Oral Daily   fentaNYL  1 patch Transdermal Q3 days   heparin  5,000 Units Subcutaneous Q8H   hydrALAZINE  25 mg Oral Q8H   levothyroxine  75 mcg Oral QAC breakfast   nystatin cream  1 application Topical BID   omega-3 acid ethyl esters  1 g Oral Daily   polyethylene glycol  17 g Oral Daily   QUEtiapine  25 mg Oral QHS   senna  8.6 mg Oral Daily   Continuous Infusions:  Intake/Output  Intake/Output Summary (Last 24 hours) at 03/17/2021 0859 Last data filed at 03/17/2021 0600 Gross per 24 hour  Intake 640 ml  Output --  Net 640 ml    Intake/Output from previous day: 10/07 0701 - 10/08 0700 In: 740 [P.O.:740] Out: -  Net IO Since Admission: 2,040 mL [03/17/21 0859]   Weight change:   Wt Readings from Last 3 Encounters:  02/03/18 54.4 kg  07/22/17 57.6 kg  07/13/17 57.8 kg     Consultants:see note  Procedures:see  note Antimicrobials: Anti-infectives (From admission, onward)    Start     Dose/Rate Route Frequency Ordered Stop   03/10/21 1000  cefTRIAXone (ROCEPHIN) 1 g in sodium chloride 0.9 % 100 mL IVPB        1 g 200 mL/hr over 30 Minutes Intravenous Every 24 hours 03/10/21 0925 03/12/21 1147   03/08/21 0230  cefTRIAXone (ROCEPHIN) 1 g in sodium chloride 0.9 % 100 mL IVPB        1 g 200 mL/hr over 30 Minutes Intravenous  Once 03/08/21 0229 03/08/21 0455      Culture/Microbiology    Component Value Date/Time   SDES  07/15/2017 0453    BLOOD RIGHT FOREARM Performed at [facility], [place] [address] [place], [place] [address]    SPECREQUEST  07/15/2017 0453    BOTTLES DRAWN AEROBIC ONLY Blood Culture adequate volume Performed at [place] [address] [place], [place] [address]    CULT  07/15/2017 0453    NO GROWTH 5 DAYS Performed at [facility], [place] [address] [place], [place] [address]    REPTSTATUS 07/20/2017 FINAL 07/15/2017 0453    Other culture-see note  Unresulted Labs (From admission, onward)    None      Data Reviewed: I have personally reviewed following labs and imaging studies CBC: Recent Labs  Lab 03/12/21 0351  WBC 6.2  HGB 11.6*  HCT 34.1*  MCV 92.9  PLT 135*    Basic Metabolic Panel: Recent Labs  Lab 03/12/21 0351 03/13/21 0511 03/15/21 0320 03/16/21 0511 03/17/21 0257  NA 140 140 142 139 137  K 3.9 3.9 3.9 4.1 4.5  CL 107 105 108 108 105  CO2 26 24 27 23 24   GLUCOSE 109* 140* 95 90 108*  BUN 25* 22 17 20 22   CREATININE 1.52* 1.31* 1.21* 1.25* 1.35*  CALCIUM 9.3 9.5 9.0 9.0 8.9    GFR: CrCl cannot be calculated (Unknown ideal weight.). Liver Function Tests: No results for input(s): AST, ALT, ALKPHOS, BILITOT, PROT, ALBUMIN in the last 168 hours. No results for input(s): LIPASE, AMYLASE in the last 168 hours. No results for input(s): AMMONIA in the last 168 hours. Coagulation  Profile: No results for input(s): INR, PROTIME in the last 168 hours. Cardiac Enzymes: No results for input(s): CKTOTAL, CKMB, CKMBINDEX, TROPONINI in the last 168 hours. BNP (last 3 results) No results for input(s): PROBNP in the last 8760 hours. HbA1C: No results for input(s): HGBA1C in the last 72 hours. CBG: No results for input(s): GLUCAP in the last 168 hours. Lipid Profile: No results for input(s): CHOL, HDL, LDLCALC, TRIG, CHOLHDL, LDLDIRECT in the last 72 hours. Thyroid Function Tests: No results for input(s): TSH, T4TOTAL, FREET4, T3FREE, THYROIDAB in the last 72 hours. Anemia Panel: No results for input(s): VITAMINB12, FOLATE, FERRITIN, TIBC, IRON, RETICCTPCT in the last 72 hours. Sepsis Labs: No results for input(s): PROCALCITON, LATICACIDVEN in the last 168 hours.  Recent Results (from the past 240 hour(s))  Resp Panel by RT-PCR (Flu A&B, Covid) Nasopharyngeal Swab     Status: None   Collection Time: 03/08/21  1:40 AM   Specimen: Nasopharyngeal Swab; Nasopharyngeal(NP) swabs in vial transport medium  Result Value Ref Range Status   SARS Coronavirus 2 by RT PCR NEGATIVE NEGATIVE Final    Comment: (NOTE) SARS-CoV-2 target nucleic acids are NOT DETECTED.  The SARS-CoV-2 RNA is generally detectable in upper respiratory specimens during the acute phase of infection. The lowest concentration of SARS-CoV-2 viral copies this assay can detect is 138 copies/mL. A negative result does not preclude SARS-Cov-2 infection and should not be used as the sole basis for treatment or other patient management decisions. A negative result may occur with  improper specimen collection/handling, submission of specimen other than nasopharyngeal swab, presence of viral mutation(s) within the areas targeted by this assay, and inadequate number of viral copies(<138 copies/mL). A negative result must be combined with clinical observations, patient history, and epidemiological information. The  expected result is Negative.  Fact Sheet for Patients:  EntrepreneurPulse.com.au  Fact Sheet for Healthcare Providers:  IncredibleEmployment.be  This test is no t yet approved or cleared by the [place] FDA and  has been authorized for detection and/or diagnosis of SARS-CoV-2 by FDA under an Emergency Use Authorization (EUA). This EUA will remain  in effect (meaning this test can be used) for the duration of the COVID-19 declaration under Section 564(b)(1) of the Act, 21 U.S.C.section 360bbb-3(b)(1), unless  the authorization is terminated  or revoked sooner.       Influenza A by PCR NEGATIVE NEGATIVE Final   Influenza B by PCR NEGATIVE NEGATIVE Final    Comment: (NOTE) The Xpert Xpress SARS-CoV-2/FLU/RSV plus assay is intended as an aid in the diagnosis of influenza from Nasopharyngeal swab specimens and should not be used as a sole basis for treatment. Nasal washings and aspirates are unacceptable for Xpert Xpress SARS-CoV-2/FLU/RSV testing.  Fact Sheet for Patients: EntrepreneurPulse.com.au  Fact Sheet for Healthcare Providers: IncredibleEmployment.be  This test is not yet approved or cleared by the [place] FDA and has been authorized for detection and/or diagnosis of SARS-CoV-2 by FDA under an Emergency Use Authorization (EUA). This EUA will remain in effect (meaning this test can be used) for the duration of the COVID-19 declaration under Section 564(b)(1) of the Act, 21 U.S.C. section 360bbb-3(b)(1), unless the authorization is terminated or revoked.  Performed at [facility], [place] [address], [place], [place] [address]   Resp Panel by RT-PCR (Flu A&B, Covid) Nasopharyngeal Swab     Status: Abnormal   Collection Time: 03/12/21  3:52 PM   Specimen: Nasopharyngeal Swab; Nasopharyngeal(NP) swabs in vial transport medium  Result Value Ref Range Status   SARS Coronavirus 2 by RT PCR  POSITIVE (A) NEGATIVE Final    Comment: RESULT CALLED TO, READ BACK BY AND VERIFIED WITH: Jeani Hawking RN [id number] 03/12/21 A BROWNING (NOTE) SARS-CoV-2 target nucleic acids are DETECTED.  The SARS-CoV-2 RNA is generally detectable in upper respiratory specimens during the acute phase of infection. Positive results are indicative of the presence of the identified virus, but do not rule out bacterial infection or co-infection with other pathogens not detected by the test. Clinical correlation with patient history and other diagnostic information is necessary to determine patient infection status. The expected result is Negative.  Fact Sheet for Patients: EntrepreneurPulse.com.au  Fact Sheet for Healthcare Providers: IncredibleEmployment.be  This test is not yet approved or cleared by the [place] FDA and  has been authorized for detection and/or diagnosis of SARS-CoV-2 by FDA under an Emergency Use Authorization (EUA).  This EUA will remain in effect (meaning this test can  be used) for the duration of  the COVID-19 declaration under Section 564(b)(1) of the Act, 21 U.S.C. section 360bbb-3(b)(1), unless the authorization is terminated or revoked sooner.     Influenza A by PCR NEGATIVE NEGATIVE Final   Influenza B by PCR NEGATIVE NEGATIVE Final    Comment: (NOTE) The Xpert Xpress SARS-CoV-2/FLU/RSV plus assay is intended as an aid in the diagnosis of influenza from Nasopharyngeal swab specimens and should not be used as a sole basis for treatment. Nasal washings and aspirates are unacceptable for Xpert Xpress SARS-CoV-2/FLU/RSV testing.  Fact Sheet for Patients: EntrepreneurPulse.com.au  Fact Sheet for Healthcare Providers: IncredibleEmployment.be  This test is not yet approved or cleared by the [place] FDA and has been authorized for detection and/or diagnosis of SARS-CoV-2 by FDA under an Emergency  Use Authorization (EUA). This EUA will remain in effect (meaning this test can be used) for the duration of the COVID-19 declaration under Section 564(b)(1) of the Act, 21 U.S.C. section 360bbb-3(b)(1), unless the authorization is terminated or revoked.  Performed at [facility], [place] [address], [place], [place] [address]   Resp Panel by RT-PCR (Flu A&B, Covid) Nasopharyngeal Swab     Status: None   Collection Time: 03/13/21  8:04 AM   Specimen: Nasopharyngeal Swab; Nasopharyngeal(NP)  swabs in vial transport medium  Result Value Ref Range Status   SARS Coronavirus 2 by RT PCR NEGATIVE NEGATIVE Final    Comment: (NOTE) SARS-CoV-2 target nucleic acids are NOT DETECTED.  The SARS-CoV-2 RNA is generally detectable in upper respiratory specimens during the acute phase of infection. The lowest concentration of SARS-CoV-2 viral copies this assay can detect is 138 copies/mL. A negative result does not preclude SARS-Cov-2 infection and should not be used as the sole basis for treatment or other patient management decisions. A negative result may occur with  improper specimen collection/handling, submission of specimen other than nasopharyngeal swab, presence of viral mutation(s) within the areas targeted by this assay, and inadequate number of viral copies(<138 copies/mL). A negative result must be combined with clinical observations, patient history, and epidemiological information. The expected result is Negative.  Fact Sheet for Patients:  EntrepreneurPulse.com.au  Fact Sheet for Healthcare Providers:  IncredibleEmployment.be  This test is no t yet approved or cleared by the [place] FDA and  has been authorized for detection and/or diagnosis of SARS-CoV-2 by FDA under an Emergency Use Authorization (EUA). This EUA will remain  in effect (meaning this test can be used) for the duration of the COVID-19 declaration under Section 564(b)(1)  of the Act, 21 U.S.C.section 360bbb-3(b)(1), unless the authorization is terminated  or revoked sooner.       Influenza A by PCR NEGATIVE NEGATIVE Final   Influenza B by PCR NEGATIVE NEGATIVE Final    Comment: (NOTE) The Xpert Xpress SARS-CoV-2/FLU/RSV plus assay is intended as an aid in the diagnosis of influenza from Nasopharyngeal swab specimens and should not be used as a sole basis for treatment. Nasal washings and aspirates are unacceptable for Xpert Xpress SARS-CoV-2/FLU/RSV testing.  Fact Sheet for Patients: EntrepreneurPulse.com.au  Fact Sheet for Healthcare Providers: IncredibleEmployment.be  This test is not yet approved or cleared by the [place] FDA and has been authorized for detection and/or diagnosis of SARS-CoV-2 by FDA under an Emergency Use Authorization (EUA). This EUA will remain in effect (meaning this test can be used) for the duration of the COVID-19 declaration under Section 564(b)(1) of the Act, 21 U.S.C. section 360bbb-3(b)(1), unless the authorization is terminated or revoked.  Performed at [facility], [place] [address] [place], [place] [address]       Radiology Studies: No results found.   LOS: 8 days   Antonieta Pert, MD [facility]  03/17/2021, 8:59 AM

## 2021-03-17 NOTE — Plan of Care (Signed)

## 2021-03-17 NOTE — Progress Notes (Signed)
Pt took PO medications with assistance of her daughter and son in law present in room.

## 2021-03-17 NOTE — TOC Progression Note (Signed)
Transition of Care [facility]) - Progression Note    Patient Details  Name: Alexandra Alexander MRN: [id number] Date of Birth: [birth year]  Transition of Care [facility]) CM/SW [place], [place] Phone Number: 03/17/2021, 3:01 PM  Clinical Narrative:    CSW was notified by Palliative NP that pt's family had specific questions about hospice options. CSW provided medicare.gov list and discussed residential hospice vs home with hospice vs palliative care and all questions were answered. CSW noted that pt is not eligible yet for inpatient hospice, but may be for residential hospice. Barriers include, pt not being able to go back to [place] in her condition, not being appropriate for SNF, and not qualifying for short term hospice.   Their first choice would be the [facility], whose criteria is the pt is no longer eating and is requiring IV pain medications. Their second choice would be [facility] in [place] which gives them the option of paying out of pocket to stay at the facility until she has a 24 hour nursing need, then medicare will pick up the bill. It is 225 per day, with 2 weeks up front.  There are several further away that would not be as convenient for family.  At this time the family is still deciding on their next steps. Pt may meet residential criteria if she declines further. TOC will continue to follow for DC planning.    Expected Discharge Plan: [place] Barriers to Discharge: Continued Medical Work up, Requiring sitter/restraints  Expected Discharge Plan and Services Expected Discharge Plan: [place] Choice: [place] arrangements for the past 2 months: [place] Expected Discharge Date: 03/14/21                                     Social Determinants of Health (SDOH) Interventions    Readmission Risk Interventions No flowsheet data found.

## 2021-03-17 NOTE — Consult Note (Signed)
Palliative Medicine Inpatient Consult Note  Consulting Provider: Antonieta Pert, MD  Reason for consult:   Alexandra Alexander Palliative Medicine Consult  Reason for Consult? goc    HPI:  Per intake H&P --> 87 year old female with HTN, hypothyroidism, CKD stage IIIb, anemia, thrombocytopenia presented to the ED after a fall at the facility.  In the ED CT head and C-spine showed C1 ring fracture, neurosurgeon was consulted advised to place the patient in cervical collar and follow-up outpatient with neurosurgery.Patient EKG was concerning for secondary AV block cardiology consulted and monitored on telemetry, echocardiogram.  Patient was admitted treated for UTI.  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  Palliative care has been asked to get involved to discuss goals of care in the setting of chronic comorbidities and an acute decline in overall physical and nutritional function.  Clinical Assessment/Goals of Care:  *Please note that this is a verbal dictation therefore any spelling or grammatical errors are due to the "[place] [employer]" system interpretation.  I have reviewed medical records including EPIC notes, labs and imaging, received report from bedside RN, assessed the patient who is lying in bed spitting at her nurse who is trying to administer medications.    I met with patient's daughter, Alexandra Alexander, patient's son-in-law, Alexandra Alexander and spoke on speaker phone to patient's sons Alexandra Alexander and Alexandra Alexander to further discuss diagnosis prognosis, [place], EOL wishes, disposition and options.  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   I introduced Palliative Medicine as specialized medical care for people living with serious illness. It focuses on providing relief from the symptoms and stress of a serious illness. The goal is to improve quality of life for both the patient and the family.  Alexandra Alexander presently lives in [place], [place].  She has been a widow for the past 13 years.  She has 3 children who live locally.  Her oldest son, Alexandra Alexander suffered from two debilitating strokes.  She is a woman of faith and practices within Christianity.  She presently lives at [facility] assisted living where they help her with her basic activities of daily living.  Prior to hospitalization she was able to mobilize with a front wheel walker.  Per patient's daughter Alexandra Alexander for the past few months has been eating very little, perhaps 1 meal a day. She has had increased ambivalence towards taking medications and tends to not take them at all or spit them out after administration.  Her children seem to think that this is her way of saying she no longer wants to do this.  The patient herself has made passive statements to her son-in-law stating "I think I am ready to go" & "I do not want to do this anymore".  The family has noted ongoing decline in the past several months to where she is now.   From a behaviors perspective Alexandra Alexander sometimes has nighttime aggression certainly noted while she has been in the hospital though this has been  A detailed discussion was had today regarding advanced directives, patient does not have advanced directives on file.  Her three children make decisions collectively for  Alexandra Alexander.    Concepts specific to code status, artifical feeding and hydration, continued IV antibiotics and rehospitalization was had.  Patient is DO NOT RESUSCITATE/DO NOT INTUBATE CODE STATUS.  The difference between a aggressive medical intervention path  and a palliative  comfort care path for this patient at this time was had.  I shared that if Alexandra Alexander truly does not want to eat or drink and does not want to take her medicines anymore that perhaps we should consider a more comfort oriented path to care.  We reviewed comfort oriented care and hospice care at this juncture and Alexandra Alexander's life.  Her daughter was very tearful stating that she feels this is where her mother is mentally and otherwise.  I provided empathy and therapeutic support through reflective listening.  Patient's sons had their questions answered in their entirety.  The patient's 3 children have determined that they need to meet together to talk more about what they believe mom would want and if she would want to continue the present modalities of care or transition to a more comfort oriented emphasis.  The difference of inpatient hospice which is typically a 2-week or less prognosis versus hospice in a facility were reviewed.  I shared that hospice in a facility is typically a daily charge to the family though this allows for all patient care needs to be met.  Discussed the importance of continued conversation with family and their  medical providers regarding overall plan of care and treatment options, ensuring decisions are within the context of the patients values and GOCs.  Decision Maker: Patient's three children, Alexandra Alexander, and Alexandra Alexander help to make decisions for her  SUMMARY OF RECOMMENDATIONS   DNAR/DNI  Patient's 3 children would like to meet amongst themselves to determine next steps.  We have discussed options such as comfort based care and hospice care is considerations.  Ongoing palliative care support in the coming days  Code Status/Advance Care Planning: DNAR/DNI   Palliative Prophylaxis:  Oral care, turn every 2, delirium precautions  Additional Recommendations (Limitations, Scope, Preferences): Continue current scope of care    Psycho-social/Spiritual:  Desire for  further Chaplaincy support: No Additional Recommendations: Education on chronic disease burden   Prognosis: Alexandra Alexander has had a prolonged hospitalization in the setting of significant encephalopathy.  She has been more resistant towards medications and oral intake.  Balance ambulatory state is quite poor she suffers from severe frailty.  Discharge Planning: Discharge plan uncertain.  Vitals:   03/17/21 0644 03/17/21 0750  BP: (!) 145/92 112/63  Pulse: 82 79  Resp: (!) 22 20  Temp: 97.8 F (36.6 C) 98.8 F (37.1 C)  SpO2: 98% 94%    Intake/Output Summary (Last 24 hours) at 03/17/2021 1137 Last data filed at 03/17/2021 1019 Gross per 24 hour  Intake 760 ml  Output --  Net 760 ml   Gen: Frail Caucasian elderly female HEENT: moist mucous membranes CV: Regular rate and rhythm PULM: clear to auscultation bilaterally ABD: soft/nontender EXT: No edema Neuro: Alert to self  PPS: 40%   This conversation/these recommendations were discussed with patient primary care team, Dr. Lupita Leash  Time In: 1000 Time Out: 1130 Total Time: 90 Greater than 50%  of this time was spent counseling and coordinating care related to the above assessment and plan.  [place] Team Team Cell Phone: [phone] Please utilize secure chat with additional questions, if there is no response within 30 minutes please call the above phone number  Palliative Medicine Team providers are available by phone from 7am to 7pm daily and can be reached through the team cell phone.  Should this patient require assistance outside of these hours, please call the patient's attending physician.

## 2021-03-18 DIAGNOSIS — Z515 Encounter for palliative care: Secondary | ICD-10-CM | POA: Diagnosis not present

## 2021-03-18 DIAGNOSIS — G9340 Encephalopathy, unspecified: Secondary | ICD-10-CM | POA: Diagnosis not present

## 2021-03-18 DIAGNOSIS — Z7189 Other specified counseling: Secondary | ICD-10-CM | POA: Diagnosis not present

## 2021-03-18 MED ORDER — GLYCOPYRROLATE 0.2 MG/ML IJ SOLN: 0.2000 mg | INTRAMUSCULAR | Status: DC | PRN

## 2021-03-18 MED ORDER — ONDANSETRON HCL 4 MG/2ML IJ SOLN: 4.0000 mg | Freq: Four times a day (QID) | INTRAMUSCULAR | Status: DC | PRN

## 2021-03-18 MED ORDER — LORAZEPAM 2 MG/ML IJ SOLN: 0.5000 mg | Freq: Four times a day (QID) | INTRAMUSCULAR | Status: DC

## 2021-03-18 MED ORDER — HYDROMORPHONE HCL 1 MG/ML IJ SOLN: 0.2500 mg | Freq: Four times a day (QID) | INTRAMUSCULAR | Status: DC

## 2021-03-18 MED ORDER — HYDROMORPHONE HCL 1 MG/ML IJ SOLN: 0.5000 mg | INTRAMUSCULAR | Status: DC | PRN

## 2021-03-18 MED ORDER — DIPHENHYDRAMINE HCL 50 MG/ML IJ SOLN: 25.0000 mg | Freq: Four times a day (QID) | INTRAMUSCULAR | Status: DC | PRN

## 2021-03-18 MED ORDER — BISACODYL 10 MG RE SUPP: 10.0000 mg | Freq: Every day | RECTAL | Status: DC | PRN

## 2021-03-18 MED ORDER — POLYVINYL ALCOHOL 1.4 % OP SOLN
1.0000 [drp] | Freq: Four times a day (QID) | OPHTHALMIC | Status: DC | PRN
  Filled 2021-03-18: qty 15

## 2021-03-18 MED ORDER — GLYCOPYRROLATE 1 MG PO TABS
1.0000 mg | ORAL_TABLET | ORAL | Status: DC | PRN
  Filled 2021-03-18: qty 1

## 2021-03-18 MED ORDER — BIOTENE DRY MOUTH MT LIQD: 15.0000 mL | OROMUCOSAL | Status: DC | PRN

## 2021-03-18 MED ORDER — ONDANSETRON 4 MG PO TBDP: 4.0000 mg | ORAL_TABLET | Freq: Four times a day (QID) | ORAL | Status: DC | PRN

## 2021-03-18 NOTE — TOC Transition Note (Signed)
Transition of Care [facility]) - CM/SW Discharge Note   Patient Details  Name: Alexandra Alexander MRN: [id number] Date of Birth: [birth year]  [facility] CM/SW Contact:  Coralee Pesa, [place] Phone Number: 03/18/2021, 1:37 PM   Clinical Narrative:    Pt to be transported to [facility] via [facility]. Nurse to call report to [phone]. Please alert the family when pt has been picked up.   Final next level of care: [place] Barriers to Discharge: Barriers Resolved   Patient Goals and CMS Choice Patient states their goals for this hospitalization and ongoing recovery are:: patient unable to participate in goal setting, disoriented CMS Medicare.gov Compare Post Acute Care list provided to:: Patient Represenative (must comment) Choice offered to / list presented to : Adult Children  Discharge Placement              Patient chooses bed at:  [facility]) Patient to be transferred to facility by: [place] Name of family member notified: [facility] Patient and family notified of of transfer: 03/18/21  Discharge Plan and Services     Post Acute Care Choice: [place]                               Social Determinants of Health (SDOH) Interventions     Readmission Risk Interventions No flowsheet data found.

## 2021-03-18 NOTE — Progress Notes (Signed)
   03/18/21 1100  Provider Notification  Provider Name/Title Antonieta Pert, MD  Date Provider Notified 03/18/21  Time Provider Notified 1100  Notification Type Page (secure chat)  Notification Reason Change in status (Patient very agitated and unwilling to cooperate in taking oral medications, inclusing blood pressure medications.)  Provider response Other (Comment) (awaiting response)

## 2021-03-18 NOTE — Progress Notes (Signed)
Palliative Medicine Inpatient Follow Up Note   Consulting Provider: Antonieta Pert, MD   Reason for consult:   [place] Palliative Medicine Consult  Reason for Consult? goc      HPI:  Per intake H&P --> 87 year old female with HTN, hypothyroidism, CKD stage IIIb, anemia, thrombocytopenia presented to the ED after a fall at the facility.  In the ED CT head and C-spine showed C1 ring fracture, neurosurgeon was consulted advised to place the patient in cervical collar and follow-up outpatient with neurosurgery.Patient EKG was concerning for secondary AV block cardiology consulted and monitored on telemetry, echocardiogram.  Patient was admitted treated for UTI.  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   Palliative care has been asked to get involved to discuss goals of care in the setting of chronic comorbidities and an acute decline in overall physical and nutritional function.  Today's Discussion (03/18/2021):  *Please note that this is a verbal dictation therefore any spelling or grammatical errors are due to the "[place] [employer]" system interpretation.  Chart reviewed.  I met with patients bedside RN, Mickel Baas this morning. She shares with me that Charlaine appears to be in pain and is not wanting to eat, take pills, nor get VS taken. She is having what appears as pain her her abdomen.   I called patients daughter, Gregary Signs and I shared that Shavonda may be declaring herself in the setting of her FTT as presently being ready to transition to end of life care. Gregary Signs shares that she has felt this way for quite sometime now though she would appreciate if I speak to her brother Alexandra Alexander about this.  I called patients son Alexandra Alexander, he shares that last night Keairra was doing "great" she was  singing and smiling. We reviewed that this is wonderful through if we consider Asna's whole picture she was been doing poorly for a few months and this acute hospitalizations of ten days has worsened her symptoms in the setting of delirium. He shares that he is not ready to talk about comfort care and remains hopeful for improvements though he would like to meet with me this morning. We agreed to meet at 11AM for further conversations.  Questions and concerns addressed   Objective Assessment: Vital Signs Vitals:   03/17/21 2013 03/18/21 0433  BP: (!) 147/101 110/61  Pulse: 90 62  Resp: 18 20  Temp: 97.9 F (36.6 C) 98.2 F (36.8 C)  SpO2: 96% 96%    Intake/Output Summary (Last 24 hours) at 03/18/2021 8676 Last data filed at 03/17/2021 1300 Gross per 24 hour  Intake 240 ml  Output --  Net 240 ml   Gen: Frail Caucasian elderly female HEENT: moist mucous membranes CV: Regular rate and rhythm PULM: clear to auscultation bilaterally ABD: soft/nontender EXT: No edema Neuro: Alert to self  SUMMARY OF RECOMMENDATIONS   DNAR/DNI  Plan to meet with patients son at [place] for further conversations on overall trajectory of patient   Ongoing palliative care support in the coming days  Time Spent: 25 Greater than 50% of the time was spent in counseling and coordination of care ______________________________________________________________________________________ [place] Team Team Cell Phone: [phone] Please utilize secure chat with additional questions, if there is no response within 30 minutes please call the above phone number  Palliative  Medicine Team providers are available by phone from 7am to 7pm daily and can be reached through the team cell phone.  Should this patient require assistance outside of these hours, please call the patient's attending physician.

## 2021-03-18 NOTE — Progress Notes (Signed)
   Palliative Medicine Inpatient Follow Up Note  I met this late morning with patients son, Alexandra Alexander at bedside. We completed a comprehensive review of Alexandra Alexander's decline over the past one year period. We discussed that she has done poorly in the setting of recurrent UTI's and falls. Presently we discussed [name]'s prolonged hospital course and what is now considered a more terminal phase of her delirium. We reviewed the different paths which would be continue the present measures of care or transition our focus towards comfort.   We talked about transition to comfort measures in house and what that would entail inclusive of medications to control pain, dyspnea, agitation, nausea, itching, and hiccups.  We discussed stopping all uneccessary measures such as blood draws, needle sticks, cardiac monitoring, and frequent vital signs.    Patients son and daughter are in agreement with transition to comfort care. They would like for Alexandra Alexander to transition to [facility] if accepted.  SUMMARY OF RECOMMENDATIONS   DNAR/DNI   Comfort Care  Comfort medications per [facility] - will add ATC ativan IVP for symptoms of delirium related agitation; will add dilaudid 0.84m PO ATC for generalized pain  Unrestricted visitation  TOC - [facility] [place], I suspect patient has < 2 weeks of life left to live. She has stopped eating/drinking we will no longer push this if she continues to not wish for it. Patients family is aware of limited life expectancy.    Ongoing palliative care support in the coming days  Time In: 1100 Time Out:  1138 Additional Time Spent: 38 minutes Greater than 50% of the time was spent in counseling and coordination of care ______________________________________________________________________________________ [place] Cell Phone: [phone] utilize secure  chat with additional questions, if there is no response within 30 minutes please call the above phone number  Palliative Medicine Team providers are available by phone from 7am to 7pm daily and can be reached through the team cell phone.  Should this patient require assistance outside of these hours, please call the patient's attending physician.

## 2021-03-18 NOTE — Progress Notes (Signed)
Refused Vitals

## 2021-03-18 NOTE — Discharge Summary (Signed)
Physician Discharge Summary  Alexandra Alexander RWE:[id number] DOB: 06/26/1921 DOA: 03/07/2021  PCP: [name], MD  Admit date: 03/07/2021 Discharge date: 03/18/2021  Admitted From: home Disposition:  Hospice  Recommendations for Outpatient Follow-up:  Follow up hospice appointment today  Home Health:no  Equipment/Devices: none  Discharge Condition: Stable Code Status:   Code Status: DNR Diet recommendation:  Diet Order             Diet - low sodium heart healthy           Diet Carb Modified           Diet Heart Room service appropriate? Yes; Fluid consistency: Thin  Diet effective now                    Brief/Interim Summary: 87 year old female with HTN, hypothyroidism, CKD stage IIIb, anemia, thrombocytopenia presented to the ED after a fall at the facility.  In the ED CT head and C-spine showed C1 ring fracture, neurosurgeon was consulted advised to place the patient in cervical collar and follow-up outpatient with neurosurgery. Patient EKG was concerning for secondary AV block cardiology consulted and monitored on telemetry, echocardiogram.  Patient was admitted treated for UTI.  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 Patient with ongoing agitation encephalopathy needing one-to-one sitter.  Given her advanced age and multiple comorbidities palliative care was consulted, she is DNR. Patient was seen by palliative care and after family discussion family has opted for comfort care.  Patient is now accepted at [facility] and has a bed available to do and she is being discharged to inpatient hospice house today.  Discharge Diagnoses:  Acute metabolic encephalopathy: Multifactorial in the setting of UTI question postconcussion due to fall.  CT head no acute finding except for C1  ring fracture.  Completed ceftriaxone x3 days.  Mental status has been agitated, refusing care vitals.  Needing one-to-one sitter and sedatives at times.  Having poor po intake   Bradycardia secondary AV block type I seen by cardiology EKG showed normal sinus rhythm with sinus arrhythmia, diastolic held echo done showed EF 60 to 65% no R WMA.  Heart rate improved   AKI on CKD IIIb: Renal function is stable   Hypothyroidism: Continue her Synthroid   Hypertensive urgency: BP stable.  Here receiving amlodipine, hydralazine   C1 cervical fracture: Minimally displaced fractures of the left and right lateral aspects of the C1 ring, fracture age indeterminate as per ED physician who had discussed with the neurosurgeon on-call on 9/29-neurosurgery recommended no clinical intervention the patient is without symptoms and patient was planned for outpatient follow-up with neurosurgery discharged home in the ED.Patient has had multiple falls at home over last 1 to 2 years  it seems.  Continue pain management-denied any pain to me this morning.   Chronic anemia mild Chronic thrombocytopenia, mild: Monitor CBC intermittently.    Incidental COVID + 03/12/21 but negative in 10/4.  No symptomsOff isolation, no symptoms  Consults: PMT  Subjective: Somewhat lethargic this morning.  Was getting agitated later on refusing care. Discharge Exam: Vitals:   03/17/21 2013 03/18/21 0433  BP: (!) 147/101 110/61  Pulse: 90 62  Resp: 18 20  Temp: 97.9 F (36.6 C) 98.2 F (36.8 C)  SpO2: 96% 96%   General: Pt is alert, awake Cardiovascular: RRR,  S1/S2 +, no rubs, no gallops Respiratory: CTA bilaterally, no wheezing, no rhonchi Abdominal: Soft, NT, ND, bowel sounds + Extremities: no edema, no cyanosis  Discharge Instructions  Discharge Instructions     Call MD for:  difficulty breathing, headache or visual disturbances   Complete by: As directed    Call MD for:  persistant dizziness or light-headedness    Complete by: As directed    Call MD for:  persistant nausea and vomiting   Complete by: As directed    Diet - low sodium heart healthy   Complete by: As directed    Diet Carb Modified   Complete by: As directed    Discharge instructions   Complete by: As directed    Advised to follow-up with primary care physician in 1 week. Advised to take amlodipine and hydralazine as prescribed. Bystolic has been discontinued because of persistent low heart rate. She has completed treatment for UTI.   Increase activity slowly   Complete by: As directed       Allergies as of 03/18/2021       Reactions   Codeine Hives, Itching   Vancomycin Itching   Made the patient "feel like she was crawling out of her skin"        Medication List     STOP taking these medications    amLODipine 5 MG tablet Commonly known as: NORVASC   fentaNYL 25 MCG/HR Commonly known as: DURAGESIC   fentaNYL 50 MCG/HR Commonly known as: DURAGESIC   HYDROcodone-acetaminophen 5-325 MG tablet Commonly known as: NORCO/VICODIN   losartan 100 MG tablet Commonly known as: COZAAR   MOLNUPIRAVIR PO   nebivolol 10 MG tablet Commonly known as: BYSTOLIC       TAKE these medications    acetaminophen 325 MG tablet Commonly known as: TYLENOL Take 650 mg by mouth in the morning and at bedtime.   Fish Oil 1000 MG Caps Take 1,000 mg by mouth daily.   hydrALAZINE 25 MG tablet Commonly known as: APRESOLINE Take 1 tablet (25 mg total) by mouth every 8 (eight) hours. What changed:  medication strength how much to take when to take this   ICAPS PO Take 1 capsule by mouth daily.   CERTA-VITE PO Take 1 tablet by mouth daily.   levothyroxine 75 MCG tablet Commonly known as: SYNTHROID Take 75 mcg by mouth daily before breakfast.   melatonin 3 MG Tabs tablet Take 3 mg by mouth at bedtime as needed (sleep).   ondansetron 4 MG tablet Commonly known as: ZOFRAN Take 4 mg by mouth every 8 (eight) hours as  needed for nausea or vomiting.   polyethylene glycol 17 g packet Commonly known as: MIRALAX / GLYCOLAX Take 17 g by mouth daily.   QUEtiapine 25 MG tablet Commonly known as: SEROQUEL Take 1 tablet (25 mg total) by mouth at bedtime.   senna 8.6 MG tablet Commonly known as: SENOKOT Take 1 tablet by mouth daily.        Follow-up Information     [name], MD Follow up in 1 week(s).   Specialty: Internal Medicine Contact information: [place] [address] [phone]         [place], [place] Follow up in 1 week(s).   Specialty: Neurosurgery Contact information: [address] [place] [address] [phone]                Allergies  Allergen Reactions   Codeine Hives  and Itching   Vancomycin Itching    Made the patient "feel like she was crawling out of her skin"    The results of significant diagnostics from this hospitalization (including imaging, microbiology, ancillary and laboratory) are listed below for reference.    Microbiology: Recent Results (from the past 240 hour(s))  Resp Panel by RT-PCR (Flu A&B, Covid) Nasopharyngeal Swab     Status: Abnormal   Collection Time: 03/12/21  3:52 PM   Specimen: Nasopharyngeal Swab; Nasopharyngeal(NP) swabs in vial transport medium  Result Value Ref Range Status   SARS Coronavirus 2 by RT PCR POSITIVE (A) NEGATIVE Final    Comment: RESULT CALLED TO, READ BACK BY AND VERIFIED WITH: Jeani Hawking RN [id number] 03/12/21 A BROWNING (NOTE) SARS-CoV-2 target nucleic acids are DETECTED.  The SARS-CoV-2 RNA is generally detectable in upper respiratory specimens during the acute phase of infection. Positive results are indicative of the presence of the identified virus, but do not rule out bacterial infection or co-infection with other pathogens not detected by the test. Clinical correlation with patient history and other diagnostic information is necessary to  determine patient infection status. The expected result is Negative.  Fact Sheet for Patients: EntrepreneurPulse.com.au  Fact Sheet for Healthcare Providers: IncredibleEmployment.be  This test is not yet approved or cleared by the [place] FDA and  has been authorized for detection and/or diagnosis of SARS-CoV-2 by FDA under an Emergency Use Authorization (EUA).  This EUA will remain in effect (meaning this test can  be used) for the duration of  the COVID-19 declaration under Section 564(b)(1) of the Act, 21 U.S.C. section 360bbb-3(b)(1), unless the authorization is terminated or revoked sooner.     Influenza A by PCR NEGATIVE NEGATIVE Final   Influenza B by PCR NEGATIVE NEGATIVE Final    Comment: (NOTE) The Xpert Xpress SARS-CoV-2/FLU/RSV plus assay is intended as an aid in the diagnosis of influenza from Nasopharyngeal swab specimens and should not be used as a sole basis for treatment. Nasal washings and aspirates are unacceptable for Xpert Xpress SARS-CoV-2/FLU/RSV testing.  Fact Sheet for Patients: EntrepreneurPulse.com.au  Fact Sheet for Healthcare Providers: IncredibleEmployment.be  This test is not yet approved or cleared by the [place] FDA and has been authorized for detection and/or diagnosis of SARS-CoV-2 by FDA under an Emergency Use Authorization (EUA). This EUA will remain in effect (meaning this test can be used) for the duration of the COVID-19 declaration under Section 564(b)(1) of the Act, 21 U.S.C. section 360bbb-3(b)(1), unless the authorization is terminated or revoked.  Performed at [facility], [place] [address], [place], [place] [address]   Resp Panel by RT-PCR (Flu A&B, Covid) Nasopharyngeal Swab     Status: None   Collection Time: 03/13/21  8:04 AM   Specimen: Nasopharyngeal Swab; Nasopharyngeal(NP) swabs in vial transport medium  Result Value Ref Range Status    SARS Coronavirus 2 by RT PCR NEGATIVE NEGATIVE Final    Comment: (NOTE) SARS-CoV-2 target nucleic acids are NOT DETECTED.  The SARS-CoV-2 RNA is generally detectable in upper respiratory specimens during the acute phase of infection. The lowest concentration of SARS-CoV-2 viral copies this assay can detect is 138 copies/mL. A negative result does not preclude SARS-Cov-2 infection and should not be used as the sole basis for treatment or other patient management decisions. A negative result may occur with  improper specimen collection/handling, submission of specimen other than nasopharyngeal swab, presence of viral mutation(s) within the areas targeted by this assay, and inadequate  number of viral copies(<138 copies/mL). A negative result must be combined with clinical observations, patient history, and epidemiological information. The expected result is Negative.  Fact Sheet for Patients:  EntrepreneurPulse.com.au  Fact Sheet for Healthcare Providers:  IncredibleEmployment.be  This test is no t yet approved or cleared by the [place] FDA and  has been authorized for detection and/or diagnosis of SARS-CoV-2 by FDA under an Emergency Use Authorization (EUA). This EUA will remain  in effect (meaning this test can be used) for the duration of the COVID-19 declaration under Section 564(b)(1) of the Act, 21 U.S.C.section 360bbb-3(b)(1), unless the authorization is terminated  or revoked sooner.       Influenza A by PCR NEGATIVE NEGATIVE Final   Influenza B by PCR NEGATIVE NEGATIVE Final    Comment: (NOTE) The Xpert Xpress SARS-CoV-2/FLU/RSV plus assay is intended as an aid in the diagnosis of influenza from Nasopharyngeal swab specimens and should not be used as a sole basis for treatment. Nasal washings and aspirates are unacceptable for Xpert Xpress SARS-CoV-2/FLU/RSV testing.  Fact Sheet for  Patients: EntrepreneurPulse.com.au  Fact Sheet for Healthcare Providers: IncredibleEmployment.be  This test is not yet approved or cleared by the [place] FDA and has been authorized for detection and/or diagnosis of SARS-CoV-2 by FDA under an Emergency Use Authorization (EUA). This EUA will remain in effect (meaning this test can be used) for the duration of the COVID-19 declaration under Section 564(b)(1) of the Act, 21 U.S.C. section 360bbb-3(b)(1), unless the authorization is terminated or revoked.  Performed at [facility], [place] [address] [place], [place] [address]     Procedures/Studies: DG Chest 2 View  Result Date: 03/08/2021 CLINICAL DATA:  Fall EXAM: CHEST - 2 VIEW COMPARISON:  None. FINDINGS: The heart size and mediastinal contours are within normal limits. Both lungs are clear. The visualized skeletal structures are unremarkable. IMPRESSION: No active cardiopulmonary disease. Electronically Signed   By: Ulyses Jarred M.D.   On: 03/08/2021 01:36   DG Pelvis 1-2 Views  Result Date: 03/08/2021 CLINICAL DATA:  Fall, altered mental status EXAM: PELVIS - 1-2 VIEW COMPARISON:  None FINDINGS: The osseous structures are diffusely osteopenic. No acute fracture or dislocation. Moderate left hip degenerative arthritis. Ossification in the region of the anterior superior iliac spine on the right likely relates to remote trauma or inflammation involving the sartorius or tensor fascial lata. IMPRESSION: No acute fracture or dislocation Electronically Signed   By: Fidela Salisbury M.D.   On: 03/08/2021 01:27   CT HEAD WO CONTRAST (5MM)  Result Date: 03/08/2021 CLINICAL DATA:  Fall EXAM: CT HEAD WITHOUT CONTRAST CT CERVICAL SPINE WITHOUT CONTRAST TECHNIQUE: Multidetector CT imaging of the head and cervical spine was performed following the standard protocol without intravenous contrast. Multiplanar CT image reconstructions of the cervical spine  were also generated. COMPARISON:  02/04/2018 FINDINGS: CT HEAD FINDINGS Brain: There is no mass, hemorrhage or extra-axial collection. There is generalized atrophy without lobar predilection. There is hypoattenuation of the periventricular white matter, most commonly indicating chronic ischemic microangiopathy. Vascular: No abnormal hyperdensity of the major intracranial arteries or dural venous sinuses. No intracranial atherosclerosis. Skull: Small left frontal scalp hematoma.  No skull fracture. Sinuses/Orbits: No fluid levels or advanced mucosal thickening of the visualized paranasal sinuses. No mastoid or middle ear effusion. The orbits are normal. CT CERVICAL SPINE FINDINGS Alignment: No static subluxation. Facets are aligned. Occipital condyles are normally positioned. Skull base and vertebrae: There are minimally displaced fractures at the left  and right lateral aspects of the C1 ring are unchanged since 02/04/2018. No other cervical spine fracture. Soft tissues and spinal canal: No prevertebral fluid or swelling. No visible canal hematoma. Disc levels: No advanced spinal canal or neural foraminal stenosis. Upper chest: No pneumothorax, pulmonary nodule or pleural effusion. Other: Normal visualized paraspinal cervical soft tissues. IMPRESSION: 1. Minimally displaced fractures at the left and right lateral aspects of the C1 ring. 2. No acute intracranial abnormality. 3. Small left frontal scalp hematoma without skull fracture. Electronically Signed   By: Ulyses Jarred M.D.   On: 03/08/2021 01:58   CT Cervical Spine Wo Contrast  Result Date: 03/08/2021 CLINICAL DATA:  Fall EXAM: CT HEAD WITHOUT CONTRAST CT CERVICAL SPINE WITHOUT CONTRAST TECHNIQUE: Multidetector CT imaging of the head and cervical spine was performed following the standard protocol without intravenous contrast. Multiplanar CT image reconstructions of the cervical spine were also generated. COMPARISON:  02/04/2018 FINDINGS: CT HEAD FINDINGS  Brain: There is no mass, hemorrhage or extra-axial collection. There is generalized atrophy without lobar predilection. There is hypoattenuation of the periventricular white matter, most commonly indicating chronic ischemic microangiopathy. Vascular: No abnormal hyperdensity of the major intracranial arteries or dural venous sinuses. No intracranial atherosclerosis. Skull: Small left frontal scalp hematoma.  No skull fracture. Sinuses/Orbits: No fluid levels or advanced mucosal thickening of the visualized paranasal sinuses. No mastoid or middle ear effusion. The orbits are normal. CT CERVICAL SPINE FINDINGS Alignment: No static subluxation. Facets are aligned. Occipital condyles are normally positioned. Skull base and vertebrae: There are minimally displaced fractures at the left and right lateral aspects of the C1 ring are unchanged since 02/04/2018. No other cervical spine fracture. Soft tissues and spinal canal: No prevertebral fluid or swelling. No visible canal hematoma. Disc levels: No advanced spinal canal or neural foraminal stenosis. Upper chest: No pneumothorax, pulmonary nodule or pleural effusion. Other: Normal visualized paraspinal cervical soft tissues. IMPRESSION: 1. Minimally displaced fractures at the left and right lateral aspects of the C1 ring. 2. No acute intracranial abnormality. 3. Small left frontal scalp hematoma without skull fracture. Electronically Signed   By: Ulyses Jarred M.D.   On: 03/08/2021 01:58   ECHOCARDIOGRAM COMPLETE  Result Date: 03/10/2021    ECHOCARDIOGRAM REPORT   Patient Name:   Alexandra Alexander Date of Exam: 03/10/2021 Medical Rec #:  [id number]        Height:       62.0 in Accession #:    [id number]       Weight:       120.0 lb Date of Birth:  [birth year]        BSA:          1.539 m Patient Age:    87 years         BP:           110/60 mmHg Patient Gender: F                HR:           60 bpm. Exam Location:  Inpatient Procedure: 2D Echo Indications:     R00.1  Bradycardia, unspecified; R55 Syncope  History:         Patient has no prior history of Echocardiogram examinations.                  Arrythmias:Bradycardia.  Sonographer:     Restaurant manager, fast food Referring Phys:  [place] Diagnosing Phys: Dixie Dials MD IMPRESSIONS  1. Left  ventricular ejection fraction, by estimation, is 60 to 65%. The left ventricle has normal function. The left ventricle has no regional wall motion abnormalities. There is moderate concentric left ventricular hypertrophy. Left ventricular diastolic parameters are consistent with Grade I diastolic dysfunction (impaired relaxation).  2. Right ventricular systolic function is normal. The right ventricular size is normal. Mildly increased right ventricular wall thickness.  3. Left atrial size was moderately dilated.  4. Right atrial size was mildly dilated.  5. The mitral valve is degenerative. Mild mitral valve regurgitation.  6. The aortic valve is bicuspid. There is severe calcifcation of the aortic valve. There is moderate thickening of the aortic valve. Aortic valve regurgitation is not visualized. Mild aortic valve stenosis.  7. There is mild (Grade II) atheroma plaque involving the aortic root and ascending aorta.  8. The inferior vena cava is normal in size with greater than 50% respiratory variability, suggesting right atrial pressure of 3 mmHg. FINDINGS  Left Ventricle: Left ventricular ejection fraction, by estimation, is 60 to 65%. The left ventricle has normal function. The left ventricle has no regional wall motion abnormalities. The left ventricular internal cavity size was small. There is moderate  concentric left ventricular hypertrophy. Left ventricular diastolic parameters are consistent with Grade I diastolic dysfunction (impaired relaxation). Right Ventricle: The right ventricular size is normal. Mildly increased right ventricular wall thickness. Right ventricular systolic function is normal. Left Atrium: Left atrial  size was moderately dilated. Right Atrium: Right atrial size was mildly dilated. Pericardium: There is no evidence of pericardial effusion. Mitral Valve: The mitral valve is degenerative in appearance. There is mild thickening of the mitral valve leaflet(s). There is mild calcification of the mitral valve leaflet(s). Mild mitral annular calcification. Mild mitral valve regurgitation. Tricuspid Valve: The tricuspid valve is normal in structure. Tricuspid valve regurgitation is trivial. Aortic Valve: The aortic valve is bicuspid. There is severe calcifcation of the aortic valve. There is moderate thickening of the aortic valve. There is moderate aortic valve annular calcification. Aortic valve regurgitation is not visualized. Mild aortic stenosis is present. Aortic valve mean gradient measures 2.0 mmHg. Aortic valve peak gradient measures 3.4 mmHg. Aortic valve area, by VTI measures 2.01 cm. Pulmonic Valve: The pulmonic valve was normal in structure. Pulmonic valve regurgitation is not visualized. Aorta: The aortic root is normal in size and structure. There is mild (Grade II) atheroma plaque involving the aortic root and ascending aorta. Venous: The inferior vena cava is normal in size with greater than 50% respiratory variability, suggesting right atrial pressure of 3 mmHg. IAS/Shunts: The atrial septum is grossly normal.  LEFT VENTRICLE PLAX 2D LVIDd:         3.60 cm  Diastology LVIDs:         1.70 cm  LV e' medial:    4.37 cm/s LV PW:         1.30 cm  LV E/e' medial:  10.4 LV IVS:        1.30 cm  LV e' lateral:   4.37 cm/s LVOT diam:     1.80 cm  LV E/e' lateral: 10.4 LV SV:         34 LV SV Index:   22 LVOT Area:     2.54 cm  RIGHT VENTRICLE RV S prime:     16.90 cm/s TAPSE (M-mode): 2.1 cm LEFT ATRIUM             Index       RIGHT ATRIUM  Index LA diam:        4.00 cm 2.60 cm/m  RA Area:     9.78 cm LA Vol (A2C):   39.2 ml 25.48 ml/m RA Volume:   17.10 ml 11.11 ml/m LA Vol (A4C):   46.6 ml 30.29  ml/m LA Biplane Vol: 44.1 ml 28.66 ml/m  AORTIC VALVE AV Area (Vmax):    1.95 cm AV Area (Vmean):   1.78 cm AV Area (VTI):     2.01 cm AV Vmax:           92.30 cm/s AV Vmean:          64.800 cm/s AV VTI:            0.170 m AV Peak Grad:      3.4 mmHg AV Mean Grad:      2.0 mmHg LVOT Vmax:         70.70 cm/s LVOT Vmean:        45.400 cm/s LVOT VTI:          0.134 m LVOT/AV VTI ratio: 0.79  AORTA Ao Root diam: 3.10 cm Ao Asc diam:  3.10 cm MITRAL VALVE               TRICUSPID VALVE MV Area (PHT): 2.03 cm    TR Peak grad:   4.2 mmHg MV Decel Time: 373 msec    TR Vmax:        103.00 cm/s MV E velocity: 45.60 cm/s MV A velocity: 50.90 cm/s  SHUNTS MV E/A ratio:  0.90        Systemic VTI:  0.13 m                            Systemic Diam: 1.80 cm Dixie Dials MD Electronically signed by Dixie Dials MD Signature Date/Time: 03/10/2021/10:36:38 AM    Final    DG Femur Min 2 Views Right  Result Date: 03/08/2021 CLINICAL DATA:  Fall EXAM: RIGHT FEMUR 2 VIEWS COMPARISON:  None. FINDINGS: There is no evidence of fracture or other focal bone lesions. Soft tissues are unremarkable. Exostosis of the right iliac wing. IMPRESSION: Negative. Electronically Signed   By: Ulyses Jarred M.D.   On: 03/08/2021 01:32    Labs: BNP (last 3 results) No results for input(s): BNP in the last 8760 hours. Basic Metabolic Panel: Recent Labs  Lab 03/12/21 0351 03/13/21 0511 03/15/21 0320 03/16/21 0511 03/17/21 0257  NA 140 140 142 139 137  K 3.9 3.9 3.9 4.1 4.5  CL 107 105 108 108 105  CO2 26 24 27 23 24   GLUCOSE 109* 140* 95 90 108*  BUN 25* 22 17 20 22   CREATININE 1.52* 1.31* 1.21* 1.25* 1.35*  CALCIUM 9.3 9.5 9.0 9.0 8.9   Liver Function Tests: No results for input(s): AST, ALT, ALKPHOS, BILITOT, PROT, ALBUMIN in the last 168 hours. No results for input(s): LIPASE, AMYLASE in the last 168 hours. No results for input(s): AMMONIA in the last 168 hours. CBC: Recent Labs  Lab 03/12/21 0351  WBC 6.2  HGB 11.6*   HCT 34.1*  MCV 92.9  PLT 135*   Cardiac Enzymes: No results for input(s): CKTOTAL, CKMB, CKMBINDEX, TROPONINI in the last 168 hours. BNP: Invalid input(s): POCBNP CBG: No results for input(s): GLUCAP in the last 168 hours. D-Dimer No results for input(s): DDIMER in the last 72 hours. Hgb A1c No results for input(s): HGBA1C in the last 72  hours. Lipid Profile No results for input(s): CHOL, HDL, LDLCALC, TRIG, CHOLHDL, LDLDIRECT in the last 72 hours. Thyroid function studies No results for input(s): TSH, T4TOTAL, T3FREE, THYROIDAB in the last 72 hours.  Invalid input(s): FREET3 Anemia work up No results for input(s): VITAMINB12, FOLATE, FERRITIN, TIBC, IRON, RETICCTPCT in the last 72 hours. Urinalysis    Component Value Date/Time   COLORURINE YELLOW 03/08/2021 0017   APPEARANCEUR CLEAR 03/08/2021 0017   LABSPEC 1.017 03/08/2021 0017   PHURINE 5.0 03/08/2021 0017   GLUCOSEU NEGATIVE 03/08/2021 0017   HGBUR NEGATIVE 03/08/2021 0017   BILIRUBINUR NEGATIVE 03/08/2021 0017   KETONESUR NEGATIVE 03/08/2021 0017   PROTEINUR NEGATIVE 03/08/2021 0017   NITRITE NEGATIVE 03/08/2021 0017   LEUKOCYTESUR MODERATE (A) 03/08/2021 0017   Sepsis Labs Invalid input(s): PROCALCITONIN,  WBC,  LACTICIDVEN Microbiology Recent Results (from the past 240 hour(s))  Resp Panel by RT-PCR (Flu A&B, Covid) Nasopharyngeal Swab     Status: Abnormal   Collection Time: 03/12/21  3:52 PM   Specimen: Nasopharyngeal Swab; Nasopharyngeal(NP) swabs in vial transport medium  Result Value Ref Range Status   SARS Coronavirus 2 by RT PCR POSITIVE (A) NEGATIVE Final    Comment: RESULT CALLED TO, READ BACK BY AND VERIFIED WITH: Jeani Hawking RN [id number] 03/12/21 A BROWNING (NOTE) SARS-CoV-2 target nucleic acids are DETECTED.  The SARS-CoV-2 RNA is generally detectable in upper respiratory specimens during the acute phase of infection. Positive results are indicative of the presence of the identified virus, but do not  rule out bacterial infection or co-infection with other pathogens not detected by the test. Clinical correlation with patient history and other diagnostic information is necessary to determine patient infection status. The expected result is Negative.  Fact Sheet for Patients: EntrepreneurPulse.com.au  Fact Sheet for Healthcare Providers: IncredibleEmployment.be  This test is not yet approved or cleared by the [place] FDA and  has been authorized for detection and/or diagnosis of SARS-CoV-2 by FDA under an Emergency Use Authorization (EUA).  This EUA will remain in effect (meaning this test can  be used) for the duration of  the COVID-19 declaration under Section 564(b)(1) of the Act, 21 U.S.C. section 360bbb-3(b)(1), unless the authorization is terminated or revoked sooner.     Influenza A by PCR NEGATIVE NEGATIVE Final   Influenza B by PCR NEGATIVE NEGATIVE Final    Comment: (NOTE) The Xpert Xpress SARS-CoV-2/FLU/RSV plus assay is intended as an aid in the diagnosis of influenza from Nasopharyngeal swab specimens and should not be used as a sole basis for treatment. Nasal washings and aspirates are unacceptable for Xpert Xpress SARS-CoV-2/FLU/RSV testing.  Fact Sheet for Patients: EntrepreneurPulse.com.au  Fact Sheet for Healthcare Providers: IncredibleEmployment.be  This test is not yet approved or cleared by the [place] FDA and has been authorized for detection and/or diagnosis of SARS-CoV-2 by FDA under an Emergency Use Authorization (EUA). This EUA will remain in effect (meaning this test can be used) for the duration of the COVID-19 declaration under Section 564(b)(1) of the Act, 21 U.S.C. section 360bbb-3(b)(1), unless the authorization is terminated or revoked.  Performed at [facility], [place] [address], [place], [place] [address]   Resp Panel by RT-PCR (Flu A&B, Covid)  Nasopharyngeal Swab     Status: None   Collection Time: 03/13/21  8:04 AM   Specimen: Nasopharyngeal Swab; Nasopharyngeal(NP) swabs in vial transport medium  Result Value Ref Range Status   SARS Coronavirus 2 by RT PCR NEGATIVE NEGATIVE Final  Comment: (NOTE) SARS-CoV-2 target nucleic acids are NOT DETECTED.  The SARS-CoV-2 RNA is generally detectable in upper respiratory specimens during the acute phase of infection. The lowest concentration of SARS-CoV-2 viral copies this assay can detect is 138 copies/mL. A negative result does not preclude SARS-Cov-2 infection and should not be used as the sole basis for treatment or other patient management decisions. A negative result may occur with  improper specimen collection/handling, submission of specimen other than nasopharyngeal swab, presence of viral mutation(s) within the areas targeted by this assay, and inadequate number of viral copies(<138 copies/mL). A negative result must be combined with clinical observations, patient history, and epidemiological information. The expected result is Negative.  Fact Sheet for Patients:  EntrepreneurPulse.com.au  Fact Sheet for Healthcare Providers:  IncredibleEmployment.be  This test is no t yet approved or cleared by the [place] FDA and  has been authorized for detection and/or diagnosis of SARS-CoV-2 by FDA under an Emergency Use Authorization (EUA). This EUA will remain  in effect (meaning this test can be used) for the duration of the COVID-19 declaration under Section 564(b)(1) of the Act, 21 U.S.C.section 360bbb-3(b)(1), unless the authorization is terminated  or revoked sooner.       Influenza A by PCR NEGATIVE NEGATIVE Final   Influenza B by PCR NEGATIVE NEGATIVE Final    Comment: (NOTE) The Xpert Xpress SARS-CoV-2/FLU/RSV plus assay is intended as an aid in the diagnosis of influenza from Nasopharyngeal swab specimens and should not be  used as a sole basis for treatment. Nasal washings and aspirates are unacceptable for Xpert Xpress SARS-CoV-2/FLU/RSV testing.  Fact Sheet for Patients: EntrepreneurPulse.com.au  Fact Sheet for Healthcare Providers: IncredibleEmployment.be  This test is not yet approved or cleared by the [place] FDA and has been authorized for detection and/or diagnosis of SARS-CoV-2 by FDA under an Emergency Use Authorization (EUA). This EUA will remain in effect (meaning this test can be used) for the duration of the COVID-19 declaration under Section 564(b)(1) of the Act, 21 U.S.C. section 360bbb-3(b)(1), unless the authorization is terminated or revoked.  Performed at [facility], [place] [address] [place], [place] [address]      Time coordinating discharge: 35 minutes  SIGNED: Antonieta Pert, MD  [facility] 03/18/2021, 1:39 PM  If 7PM-7AM, please contact night-coverage [email or web address]

## 2021-03-18 NOTE — Progress Notes (Signed)
PROGRESS NOTE    Alexandra Alexander  EXH:[id number] DOB: 08-03-1921 DOA: 03/07/2021 PCP: [name], MD   Chief Complaint  Patient presents with   Fall   Brief Narrative/Hospital Course:  87 year old female with HTN, hypothyroidism, CKD stage IIIb, anemia, thrombocytopenia presented to the ED after a fall at the facility.  In the ED CT head and C-spine showed C1 ring fracture, neurosurgeon was consulted advised to place the patient in cervical collar and follow-up outpatient with neurosurgery. Patient EKG was concerning for secondary AV block cardiology consulted and monitored on telemetry, echocardiogram.  Patient was admitted treated for UTI.  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 Patient with ongoing agitation encephalopathy needing one-to-one sitter.  Given her advanced age and multiple comorbidities palliative care was consulted, she is DNR.  Subjective: Seen this am Lethargic- abel to speak words difficult to comprehend. Mo ving UE well. Lt BKA Overnight patient refusing vitals, agitated needing Ativan last evening Denies any pain  Assessment & Plan:  Acute metabolic encephalopathy: Multifactorial in the setting of UTI question postconcussion due to fall.  CT head no acute finding except for C1 ring fracture.  Completed ceftriaxone x3 days.  Mental status has been waxing and waning requiring one-to-one safety sitter, gets agitated and restless during night needing some restraints at night.  Mental status improving, Seroquel increased to 50 mg bedtime, ativan  x1, needing safety sitter. She is on fentanyl patch- which is her home meds, limit sedatives/psychotropic medication.  Nursing reports poor oral intake given gentle IV fluids , and will cont on the same.  Bradycardia secondary AV  block type I seen by cardiology EKG showed normal sinus rhythm with sinus arrhythmia, diastolic held echo done showed EF 60 to 65% no R WMA.  Heart rate improved  AKI on CKD IIIb: Renal function is stable Recent Labs  Lab 03/12/21 0351 03/13/21 0511 03/15/21 0320 03/16/21 0511 03/17/21 0257  BUN 25* 22 17 20 22   CREATININE 1.52* 1.31* 1.21* 1.25* 1.35*     Hypothyroidism: Continue her Synthroid  Hypertensive urgency: BP is fairly controlled.continue amlodipine, hydralazine  C1 cervical fracture: Minimally displaced fractures of the left and right lateral aspects of the C1 ring, fracture age indeterminate as per ED physician who had discussed with the neurosurgeon on-call on 9/29-neurosurgery recommended no clinical intervention the patient is without symptoms and patient was planned for outpatient follow-up with neurosurgery discharged home in the ED.Patient has had multiple falls at home over last 1 to 2 years  it seems.  Continue pain management-denied any pain to me this morning.  Chronic anemia mild Chronic thrombocytopenia, mild: Monitor CBC intermittently.   Recent Labs  Lab 03/12/21 0351  HGB 11.6*  HCT 34.1*  WBC 6.2  PLT 135*     Incidental COVID + 03/12/21 but negative in 10/4.  No symptomsOff isolation, no symptoms.  GOC: Currently DNR.  Patient has multiple medical problems with advanced age now with encephalopathy needing one-to-one sitter and will benefit with palliative care consultation.  Pale care has discussed with patient's family patient has been declining recently family will be discussing amongst themselves about further course of action. Diet Order             Diet - low sodium heart healthy           Diet Carb Modified  Diet Heart Room service appropriate? Yes; Fluid consistency: Thin  Diet effective now                 DVT prophylaxis: heparin injection 5,000 Units Start: 03/08/21 0600 Code Status:   Code Status: DNR  Family  Communication: plan of care discussed with patient at bedside.  Status is: Inpatient Remains inpatient appropriate because:Unsafe d/c plan and Inpatient level of care appropriate due to severity of illness Dispo: The patient is from:  facility              Anticipated d/c is to:  TBD              Patient currently is not medically stable to d/c.    Difficult to place patient No Objective: Vitals: Today's Vitals   03/17/21 1658 03/17/21 1707 03/17/21 2013 03/18/21 0433  BP:  (!) 150/111 (!) 147/101 110/61  Pulse:  (!) 101 90 62  Resp:  20 18 20   Temp:  99.3 F (37.4 C) 97.9 F (36.6 C) 98.2 F (36.8 C)  TempSrc:  Oral Oral Axillary  SpO2:  97% 96% 96%  PainSc: Asleep  0-No pain 0-No pain   Physical Examination: General exam: lethargic, speaking words, elderly, frail HEENT:Oral mucosa moist, Ear/Nose WNL grossly, dentition normal. Respiratory system: bilaterally clear, no use of accessory muscle Cardiovascular system: S1 & S2 +, No JVD,. Gastrointestinal system: Abdomen soft, NT,ND, BS+ Nervous System:Alert, awake, moving extremities and grossly nonfocal Extremities: LT BKA, no edema, distal peripheral pulses palpable.  Skin: No rashes,no icterus. MSK: Normal muscle bulk,tone, power   Medications reviewed:  Scheduled Meds:  amLODipine  10 mg Oral Daily   fentaNYL  1 patch Transdermal Q3 days   heparin  5,000 Units Subcutaneous Q8H   hydrALAZINE  25 mg Oral Q8H   levothyroxine  75 mcg Oral QAC breakfast   LORazepam  0.25 mg Intravenous Once   nystatin cream  1 application Topical BID   omega-3 acid ethyl esters  1 g Oral Daily   polyethylene glycol  17 g Oral Daily   QUEtiapine  50 mg Oral QHS   senna  8.6 mg Oral Daily   Continuous Infusions:  sodium chloride 50 mL/hr at 03/17/21 1045    Intake/Output  Intake/Output Summary (Last 24 hours) at 03/18/2021 0733 Last data filed at 03/17/2021 1300 Gross per 24 hour  Intake 240 ml  Output --  Net 240 ml     Intake/Output from previous day: 10/08 0701 - 10/09 0700 In: 240 [P.O.:240] Out: -  Net IO Since Admission: 2,280 mL [03/18/21 0733]   Weight change:   Wt Readings from Last 3 Encounters:  02/03/18 54.4 kg  07/22/17 57.6 kg  07/13/17 57.8 kg     Consultants:see note  Procedures:see note Antimicrobials: Anti-infectives (From admission, onward)    Start     Dose/Rate Route Frequency Ordered Stop   03/10/21 1000  cefTRIAXone (ROCEPHIN) 1 g in sodium chloride 0.9 % 100 mL IVPB        1 g 200 mL/hr over 30 Minutes Intravenous Every 24 hours 03/10/21 0925 03/12/21 1147   03/08/21 0230  cefTRIAXone (ROCEPHIN) 1 g in sodium chloride 0.9 % 100 mL IVPB        1 g 200 mL/hr over 30 Minutes Intravenous  Once 03/08/21 0229 03/08/21 0455      Culture/Microbiology    Component Value Date/Time   SDES  07/15/2017 0453    BLOOD RIGHT FOREARM Performed at  [facility], [place] [address], [place], [place] [address]    SPECREQUEST  07/15/2017 0453    BOTTLES DRAWN AEROBIC ONLY Blood Culture adequate volume Performed at [place] [address] [place], [place] [address]    CULT  07/15/2017 0453    NO GROWTH 5 DAYS Performed at [facility], [place] [address], [place], [place] [address]    REPTSTATUS 07/20/2017 FINAL 07/15/2017 0453    Other culture-see note  Unresulted Labs (From admission, onward)    None      Data Reviewed: I have personally reviewed following labs and imaging studies CBC: Recent Labs  Lab 03/12/21 0351  WBC 6.2  HGB 11.6*  HCT 34.1*  MCV 92.9  PLT 135*    Basic Metabolic Panel: Recent Labs  Lab 03/12/21 0351 03/13/21 0511 03/15/21 0320 03/16/21 0511 03/17/21 0257  NA 140 140 142 139 137  K 3.9 3.9 3.9 4.1 4.5  CL 107 105 108 108 105  CO2 26 24 27 23 24   GLUCOSE 109* 140* 95 90 108*  BUN 25* 22 17 20 22   CREATININE 1.52* 1.31* 1.21* 1.25* 1.35*  CALCIUM 9.3 9.5 9.0 9.0 8.9    GFR: CrCl  cannot be calculated (Unknown ideal weight.). Liver Function Tests: No results for input(s): AST, ALT, ALKPHOS, BILITOT, PROT, ALBUMIN in the last 168 hours. No results for input(s): LIPASE, AMYLASE in the last 168 hours. No results for input(s): AMMONIA in the last 168 hours. Coagulation Profile: No results for input(s): INR, PROTIME in the last 168 hours. Cardiac Enzymes: No results for input(s): CKTOTAL, CKMB, CKMBINDEX, TROPONINI in the last 168 hours. BNP (last 3 results) No results for input(s): PROBNP in the last 8760 hours. HbA1C: No results for input(s): HGBA1C in the last 72 hours. CBG: No results for input(s): GLUCAP in the last 168 hours. Lipid Profile: No results for input(s): CHOL, HDL, LDLCALC, TRIG, CHOLHDL, LDLDIRECT in the last 72 hours. Thyroid Function Tests: No results for input(s): TSH, T4TOTAL, FREET4, T3FREE, THYROIDAB in the last 72 hours. Anemia Panel: No results for input(s): VITAMINB12, FOLATE, FERRITIN, TIBC, IRON, RETICCTPCT in the last 72 hours. Sepsis Labs: No results for input(s): PROCALCITON, LATICACIDVEN in the last 168 hours.  Recent Results (from the past 240 hour(s))  Resp Panel by RT-PCR (Flu A&B, Covid) Nasopharyngeal Swab     Status: Abnormal   Collection Time: 03/12/21  3:52 PM   Specimen: Nasopharyngeal Swab; Nasopharyngeal(NP) swabs in vial transport medium  Result Value Ref Range Status   SARS Coronavirus 2 by RT PCR POSITIVE (A) NEGATIVE Final    Comment: RESULT CALLED TO, READ BACK BY AND VERIFIED WITH: Jeani Hawking RN 5643 03/12/21 A BROWNING (NOTE) SARS-CoV-2 target nucleic acids are DETECTED.  The SARS-CoV-2 RNA is generally detectable in upper respiratory specimens during the acute phase of infection. Positive results are indicative of the presence of the identified virus, but do not rule out bacterial infection or co-infection with other pathogens not detected by the test. Clinical correlation with patient history and other  diagnostic information is necessary to determine patient infection status. The expected result is Negative.  Fact Sheet for Patients: EntrepreneurPulse.com.au  Fact Sheet for Healthcare Providers: IncredibleEmployment.be  This test is not yet approved or cleared by the [place] FDA and  has been authorized for detection and/or diagnosis of SARS-CoV-2 by FDA under an Emergency Use Authorization (EUA).  This EUA will remain in effect (meaning this test can  be  used) for the duration of  the COVID-19 declaration under Section 564(b)(1) of the Act, 21 U.S.C. section 360bbb-3(b)(1), unless the authorization is terminated or revoked sooner.     Influenza A by PCR NEGATIVE NEGATIVE Final   Influenza B by PCR NEGATIVE NEGATIVE Final    Comment: (NOTE) The Xpert Xpress SARS-CoV-2/FLU/RSV plus assay is intended as an aid in the diagnosis of influenza from Nasopharyngeal swab specimens and should not be used as a sole basis for treatment. Nasal washings and aspirates are unacceptable for Xpert Xpress SARS-CoV-2/FLU/RSV testing.  Fact Sheet for Patients: EntrepreneurPulse.com.au  Fact Sheet for Healthcare Providers: IncredibleEmployment.be  This test is not yet approved or cleared by the [place] FDA and has been authorized for detection and/or diagnosis of SARS-CoV-2 by FDA under an Emergency Use Authorization (EUA). This EUA will remain in effect (meaning this test can be used) for the duration of the COVID-19 declaration under Section 564(b)(1) of the Act, 21 U.S.C. section 360bbb-3(b)(1), unless the authorization is terminated or revoked.  Performed at [facility], [place] [address], [place], [place] [address]   Resp Panel by RT-PCR (Flu A&B, Covid) Nasopharyngeal Swab     Status: None   Collection Time: 03/13/21  8:04 AM   Specimen: Nasopharyngeal Swab; Nasopharyngeal(NP) swabs in vial transport  medium  Result Value Ref Range Status   SARS Coronavirus 2 by RT PCR NEGATIVE NEGATIVE Final    Comment: (NOTE) SARS-CoV-2 target nucleic acids are NOT DETECTED.  The SARS-CoV-2 RNA is generally detectable in upper respiratory specimens during the acute phase of infection. The lowest concentration of SARS-CoV-2 viral copies this assay can detect is 138 copies/mL. A negative result does not preclude SARS-Cov-2 infection and should not be used as the sole basis for treatment or other patient management decisions. A negative result may occur with  improper specimen collection/handling, submission of specimen other than nasopharyngeal swab, presence of viral mutation(s) within the areas targeted by this assay, and inadequate number of viral copies(<138 copies/mL). A negative result must be combined with clinical observations, patient history, and epidemiological information. The expected result is Negative.  Fact Sheet for Patients:  EntrepreneurPulse.com.au  Fact Sheet for Healthcare Providers:  IncredibleEmployment.be  This test is no t yet approved or cleared by the [place] FDA and  has been authorized for detection and/or diagnosis of SARS-CoV-2 by FDA under an Emergency Use Authorization (EUA). This EUA will remain  in effect (meaning this test can be used) for the duration of the COVID-19 declaration under Section 564(b)(1) of the Act, 21 U.S.C.section 360bbb-3(b)(1), unless the authorization is terminated  or revoked sooner.       Influenza A by PCR NEGATIVE NEGATIVE Final   Influenza B by PCR NEGATIVE NEGATIVE Final    Comment: (NOTE) The Xpert Xpress SARS-CoV-2/FLU/RSV plus assay is intended as an aid in the diagnosis of influenza from Nasopharyngeal swab specimens and should not be used as a sole basis for treatment. Nasal washings and aspirates are unacceptable for Xpert Xpress SARS-CoV-2/FLU/RSV testing.  Fact Sheet for  Patients: EntrepreneurPulse.com.au  Fact Sheet for Healthcare Providers: IncredibleEmployment.be  This test is not yet approved or cleared by the [place] FDA and has been authorized for detection and/or diagnosis of SARS-CoV-2 by FDA under an Emergency Use Authorization (EUA). This EUA will remain in effect (meaning this test can be used) for the duration of the COVID-19 declaration under Section 564(b)(1) of the Act, 21 U.S.C. section 360bbb-3(b)(1), unless the authorization is terminated  or revoked.  Performed at [facility], [place] [address], [place],  [address]       Radiology Studies: No results found.   LOS: 9 days   Antonieta Pert, MD [facility]  03/18/2021, 7:33 AM

## 2021-03-24 DIAGNOSIS — Z9181 History of falling: Secondary | ICD-10-CM | POA: Diagnosis not present

## 2021-03-24 DIAGNOSIS — I441 Atrioventricular block, second degree: Secondary | ICD-10-CM | POA: Diagnosis not present

## 2021-03-24 DIAGNOSIS — E034 Atrophy of thyroid (acquired): Secondary | ICD-10-CM | POA: Diagnosis not present

## 2021-03-24 DIAGNOSIS — I10 Essential (primary) hypertension: Secondary | ICD-10-CM | POA: Diagnosis not present

## 2021-04-10 DEATH — deceased
